# Patient Record
Sex: Female | Born: 1935 | Race: White | Hispanic: No | Smoking: Never smoker
Health system: Southern US, Community
[De-identification: ages and names within clinical notes are randomized; demographics above are authoritative.]

## PROBLEM LIST (undated history)

## (undated) DIAGNOSIS — E785 Hyperlipidemia, unspecified: Secondary | ICD-10-CM

## (undated) DIAGNOSIS — I5032 Chronic diastolic (congestive) heart failure: Secondary | ICD-10-CM

## (undated) DIAGNOSIS — I350 Nonrheumatic aortic (valve) stenosis: Secondary | ICD-10-CM

## (undated) DIAGNOSIS — K219 Gastro-esophageal reflux disease without esophagitis: Secondary | ICD-10-CM

## (undated) DIAGNOSIS — R011 Cardiac murmur, unspecified: Secondary | ICD-10-CM

## (undated) DIAGNOSIS — IMO0001 Reserved for inherently not codable concepts without codable children: Secondary | ICD-10-CM

## (undated) DIAGNOSIS — F419 Anxiety disorder, unspecified: Secondary | ICD-10-CM

## (undated) DIAGNOSIS — M1990 Unspecified osteoarthritis, unspecified site: Secondary | ICD-10-CM

## (undated) DIAGNOSIS — I10 Essential (primary) hypertension: Secondary | ICD-10-CM

## (undated) DIAGNOSIS — M47819 Spondylosis without myelopathy or radiculopathy, site unspecified: Secondary | ICD-10-CM

## (undated) DIAGNOSIS — M545 Low back pain, unspecified: Secondary | ICD-10-CM

## (undated) DIAGNOSIS — M4800 Spinal stenosis, site unspecified: Secondary | ICD-10-CM

## (undated) DIAGNOSIS — M47899 Other spondylosis, site unspecified: Secondary | ICD-10-CM

## (undated) DIAGNOSIS — Z952 Presence of prosthetic heart valve: Secondary | ICD-10-CM

## (undated) DIAGNOSIS — M4807 Spinal stenosis, lumbosacral region: Secondary | ICD-10-CM

## (undated) DIAGNOSIS — M47817 Spondylosis without myelopathy or radiculopathy, lumbosacral region: Secondary | ICD-10-CM

## (undated) HISTORY — PX: EYE SURGERY: SHX253

## (undated) HISTORY — PX: REPLACEMENT TOTAL KNEE: SUR1224

## (undated) HISTORY — DX: Low back pain: M54.5

## (undated) HISTORY — DX: Nonrheumatic aortic (valve) stenosis: I35.0

## (undated) HISTORY — DX: Unspecified osteoarthritis, unspecified site: M19.90

## (undated) HISTORY — PX: REFRACTIVE SURGERY: SHX103

## (undated) HISTORY — DX: Hyperlipidemia, unspecified: E78.5

## (undated) HISTORY — DX: Other spondylosis, site unspecified: M47.899

## (undated) HISTORY — PX: TONSILLECTOMY: SUR1361

## (undated) HISTORY — DX: Spinal stenosis, site unspecified: M48.00

## (undated) HISTORY — DX: Spondylosis without myelopathy or radiculopathy, site unspecified: M47.819

## (undated) HISTORY — DX: Spondylosis without myelopathy or radiculopathy, lumbosacral region: M47.817

## (undated) HISTORY — DX: Low back pain, unspecified: M54.50

## (undated) HISTORY — PX: CATARACT EXTRACTION: SUR2

## (undated) HISTORY — DX: Spinal stenosis, lumbosacral region: M48.07

## (undated) HISTORY — PX: KNEE ARTHROSCOPY: SUR90

## (undated) HISTORY — DX: Gastro-esophageal reflux disease without esophagitis: K21.9

## (undated) HISTORY — DX: Chronic diastolic (congestive) heart failure: I50.32

## (undated) HISTORY — DX: Essential (primary) hypertension: I10

---

## 2005-10-09 ENCOUNTER — Encounter: Admission: RE | Admit: 2005-10-09 | Discharge: 2005-10-09 | Payer: Self-pay | Admitting: Orthopedic Surgery

## 2005-10-25 ENCOUNTER — Encounter: Admission: RE | Admit: 2005-10-25 | Discharge: 2005-11-13 | Payer: Self-pay | Admitting: Orthopedic Surgery

## 2006-02-19 HISTORY — PX: CHOLECYSTECTOMY: SHX55

## 2007-03-25 ENCOUNTER — Ambulatory Visit: Payer: Self-pay | Admitting: Gastroenterology

## 2007-03-25 LAB — CONVERTED CEMR LAB
ALT: 21 units/L (ref 0–35)
AST: 16 units/L (ref 0–37)
Basophils Absolute: 0.1 10*3/uL (ref 0.0–0.1)
Basophils Relative: 0.8 % (ref 0.0–1.0)
CO2: 33 meq/L — ABNORMAL HIGH (ref 19–32)
Chloride: 100 meq/L (ref 96–112)
Eosinophils Absolute: 0.1 10*3/uL (ref 0.0–0.6)
GFR calc Af Amer: 78 mL/min
GFR calc non Af Amer: 64 mL/min
Glucose, Bld: 102 mg/dL — ABNORMAL HIGH (ref 70–99)
Lymphocytes Relative: 35.8 % (ref 12.0–46.0)
Monocytes Absolute: 0.9 10*3/uL — ABNORMAL HIGH (ref 0.2–0.7)
Neutro Abs: 3.7 10*3/uL (ref 1.4–7.7)
Neutrophils Relative %: 49.9 % (ref 43.0–77.0)
Platelets: 356 10*3/uL (ref 150–400)
Potassium: 3.2 meq/L — ABNORMAL LOW (ref 3.5–5.1)
Total Bilirubin: 1.1 mg/dL (ref 0.3–1.2)
Total Protein: 7.3 g/dL (ref 6.0–8.3)

## 2007-03-26 ENCOUNTER — Ambulatory Visit: Payer: Self-pay | Admitting: Gastroenterology

## 2007-04-02 ENCOUNTER — Ambulatory Visit: Payer: Self-pay | Admitting: Gastroenterology

## 2007-04-02 LAB — CONVERTED CEMR LAB
BUN: 8 mg/dL (ref 6–23)
CO2: 33 meq/L — ABNORMAL HIGH (ref 19–32)
Chloride: 102 meq/L (ref 96–112)
GFR calc Af Amer: 89 mL/min
GFR calc non Af Amer: 74 mL/min
Potassium: 3.5 meq/L (ref 3.5–5.1)
Sodium: 143 meq/L (ref 135–145)

## 2007-04-09 ENCOUNTER — Ambulatory Visit: Payer: Self-pay | Admitting: Gastroenterology

## 2007-06-25 ENCOUNTER — Emergency Department (HOSPITAL_COMMUNITY): Admission: EM | Admit: 2007-06-25 | Discharge: 2007-06-25 | Payer: Self-pay | Admitting: Emergency Medicine

## 2008-03-29 ENCOUNTER — Encounter: Admission: RE | Admit: 2008-03-29 | Discharge: 2008-04-15 | Payer: Self-pay | Admitting: Sports Medicine

## 2010-07-04 NOTE — Assessment & Plan Note (Signed)
[facility]                         GASTROENTEROLOGY OFFICE NOTE   NAME:MOONEYFraya, Blasko                       MRN:          [id number]  DATE:03/25/2007                            DOB:          1925-04-25    PRIMARY CARE PHYSICIAN:  Dr. Eligah East.  She was self referred.  Her  chief complaint is dysphagia.   HISTORY OF PRESENT ILLNESS:  [name] is a very pleasant 77 year old  woman who had an acute diarrhea vomiting illness 1 week ago.  She said  she awoke Wednesday morning with prolific watery diarrhea, and vomited  copious amounts of gastric contents.  Since then she has eaten only  sporadically.  Most foods, and even liquids, tend to hang in her mid  sternum.  She is keeping her pills down, however.  She has not had a  good meal in several days.  GERD (long time pyrosis, has been on PPI for  10 to 15 years with very good relief of symptoms).   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   PAST MEDICAL HISTORY:  1. Hypertension.  2. Elevated cholesterol.  3. Status post cholecystectomy.   CURRENT MEDICINES:  1. Carvedilol.  2. Felodipine.  3. Hydrochlorothiazide.  4. Simvastatin.  5. Vitamin D.  6. Fish oil.  7. Omeprazole.   ALLERGIES:  No known drug allergies.   SOCIAL HISTORY:  Widowed, 3 children, nonsmoker, nondrinker.   FAMILY HISTORY:  No colon cancer or colon polyps in family.  Breast  cancer runs in her family.   PHYSICAL EXAMINATION:  Height 5 feet 5 inches, 154 pounds.  Blood  pressure 138/78, pulse 66.  CONSTITUTIONAL:  Generally well appearing.  NEUROLOGIC:  Alert and oriented x3.  EYES:  Extraocular movements intact.  MOUTH:  Oropharynx moist, no lesions.  NECK:  Supple, no lymphadenopathy.  CARDIOVASCULAR:  Heart regular rate and rhythm.  LUNGS:  Clear to auscultation bilaterally.  ABDOMEN:   Soft, nontender, nondistended, normal bowel sounds.  EXTREMITIES:  No lower extremity edema.  SKIN:  No rashes, lesions on visible extremities.   ASSESSMENT AND PLAN:  A 77 year old woman with recent diarrhea and  vomiting illness, solid and liquid food dysphagia since then  (approximately 1 week).   She is keeping her pills down, so I suspect some of the liquids she  drinks is getting through.  She does not appear overtly dehydrated on  exam.  That being said, I will get her a basic set of labs today  including a CBC and a complete metabolic profile.  I will also arrange  for her to have an esophagogastroduodenoscopy performed tomorrow.  She  has to push fluids until then.  I am suspicious of peptic stricturing,  perhaps monilial esophagitis.  Given her weight gain in the past couple  of years, it seems unlikely that she has a neoplasm, but that is also on  the differential list.     Milus Banister, MD  Electronically Signed    DPJ/MedQ  DD: 03/25/2007  DT: 03/25/2007  Job #: [id number]   cc:   Eligah East, MD

## 2010-07-04 NOTE — Assessment & Plan Note (Signed)
[place]                         GASTROENTEROLOGY OFFICE NOTE   NAME:MOONEYAreyanna, Smith                       MRN:          [id number]  DATE:04/09/2007                            DOB:          07-02-1925    PRIMARY CARE PHYSICIAN:  Dr. Eligah East   GI PROBLEM LIST:  Acute vomiting/diarrheal illness, January 2009.  Likely infectious.  Esophagogastroduodenoscopy, February 2009, showed  mild nonspecific gastritis and CLO-testing was negative.  Responded to  empiric therapy very well.   INTERVAL HISTORY:  I last saw [name] at the time of her EGD about two  weeks ago.  Since then, she has stayed on the Reglan t.i.d. and the  Phenergan at night and her symptoms completely resolved.  The diarrhea  is gone.  She is eating normally now.   CURRENT MEDICINES:  1. Carvedilol.  2. Felodipine.  3. Hydrochlorothiazide.  4. Simvastatin.  5. Vitamin D.  6. Fish oil.  7. Omeprazole.  8. Reglan 5 mg t.i.d..  9. Phenergan 25 mg nightly.   PHYSICAL EXAM:  Weight 155 pounds, which is up 1 pound since her last  visit.  Blood pressure 138/74, pulse 76.  CONSTITUTIONAL:  Generally well-appearing.  ABDOMEN:  Soft, nontender, nondistended.  Normal bowel sounds.   ASSESSMENT AND PLAN:  Patient is a 77 year old woman with resolved  diarrheal and vomiting illness.   I suspect she had a viral gastroenteritis and she is doing very well  over the past one to two weeks.  She will stop taking the Reglan and the  Phenergan for now and knows that, if mild nausea returns, I favor her  taking the Phenergan at night.  She knows if anything dramatic recurs,  however, to get back in touch with me.  I see no reason for any further  testing at this point and she will simply get back in touch with me if  she has any further troubles.    Milus Banister, MD  Electronically Signed   DPJ/MedQ  DD: 04/09/2007  DT: 04/09/2007  Job #: [id number]   cc:   Eligah East, MD

## 2011-05-14 ENCOUNTER — Ambulatory Visit (INDEPENDENT_AMBULATORY_CARE_PROVIDER_SITE_OTHER): Payer: Medicare Other | Admitting: Nurse Practitioner

## 2011-05-14 ENCOUNTER — Telehealth: Payer: Self-pay | Admitting: Gastroenterology

## 2011-05-14 ENCOUNTER — Encounter: Payer: Self-pay | Admitting: Nurse Practitioner

## 2011-05-14 VITALS — BP 128/68 | HR 72 | Ht 64.0 in | Wt 153.4 lb

## 2011-05-14 DIAGNOSIS — K219 Gastro-esophageal reflux disease without esophagitis: Secondary | ICD-10-CM | POA: Insufficient documentation

## 2011-05-14 DIAGNOSIS — R1310 Dysphagia, unspecified: Secondary | ICD-10-CM | POA: Insufficient documentation

## 2011-05-14 HISTORY — DX: Gastro-esophageal reflux disease without esophagitis: K21.9

## 2011-05-14 NOTE — Patient Instructions (Signed)
Increase the Omeprazole 20 mg to twice daily.    We have scheduled the Endoscopy with Dr. Owens Loffler for Wednesday 05-16-2011. Directions and information provided.  Esophagogastroduodenoscopy  This is an endoscopic procedure (a procedure that uses a device like a flexible telescope) that allows your caregiver to view the upper stomach and small bowel. This test allows your caregiver to look at the esophagus. The esophagus carries food from your mouth to your stomach. They can also look at your duodenum. This is the first part of the small intestine that attaches to the stomach. This test is used to detect problems in the bowel such as ulcers and inflammation. PREPARATION FOR TEST Nothing to eat after midnight the day before the test. NORMAL FINDINGS Normal esophagus, stomach, and duodenum. Ranges for normal findings may vary among different laboratories and hospitals. You should always check with your doctor after having lab work or other tests done to discuss the meaning of your test results and whether your values are considered within normal limits. MEANING OF TEST  Your caregiver will go over the test results with you and discuss the importance and meaning of your results, as well as treatment options and the need for additional tests if necessary. OBTAINING THE TEST RESULTS It is your responsibility to obtain your test results. Ask the lab or department performing the test when and how you will get your results. Document Released: 06/08/2004 Document Revised: 01/25/2011 Document Reviewed: 01/16/2008 [facility] Patient Information 2012 [place].information provided.

## 2011-05-14 NOTE — Telephone Encounter (Signed)
Pt is having severe swallowing problems and is choking on everything she eats, also she has indigestion that is causing her a lot of pain.  Pt has been added to [employer] schedule for today at 130pm

## 2011-05-14 NOTE — Progress Notes (Signed)
05/14/2011 LUCYANA NIMER [id number] [birth year]   HISTORY OF PRESENT ILLNESS: Patient is an 78 year old female who was seen by Dr. Ardis Hughs in 2009 for dysphagia and nausea. She subsequently had an EGD revealing only gastritis.   Patient comes in today for evaluation of dysphagia. She cannot be specific about its onset as it seems to be somewhat of an ongoing problem with has acutely gotten worse over the last 4 day.  To avoid impaction she forces herself to regurgitate esophageal contents.  No odynophagia. She hasn't had any recent antibiotics to suggest candida esophagitis. She has taken Omeprazole for years but often takes it after her breakfast. Also taking Tums for breakthrough heartburn.    Past Medical History  Diagnosis Date  . Hypertension   . Hyperlipidemia    Past Surgical History  Procedure Date  . Cholecystectomy 2008  . Tonsillectomy     70 years ago    reports that she has never smoked. She does not have any smokeless tobacco history on file. She reports that she does not drink alcohol or use illicit drugs. family history includes Breast cancer in her sister and Muscular dystrophy in her father.  There is no history of Colon cancer. Not on File    Outpatient Encounter Prescriptions as of 05/14/2011  Medication Sig Dispense Refill  . amLODipine (NORVASC) 10 MG tablet Take 10 mg by mouth daily.      Marland Kitchen atorvastatin (LIPITOR) 40 MG tablet Take 40 mg by mouth daily. Take 1 tab at bedtime      . carvedilol (COREG) 12.5 MG tablet Take 12.5 mg by mouth. Take 1 AM and PM      . cholecalciferol (VITAMIN D) 1000 UNITS tablet Take 1,000 Units by mouth daily.      . fenofibrate 160 MG tablet Take 160 mg by mouth daily. Take 1 tab daily      . fish oil-omega-3 fatty acids 1000 MG capsule Take 2 g by mouth daily. Take 1 tab      . hydrochlorothiazide (HYDRODIURIL) 25 MG tablet Take 25 mg by mouth daily.      Marland Kitchen omeprazole (PRILOSEC) 20 MG capsule Take 20 mg by mouth daily. Take 1 tab          REVIEW OF SYSTEMS  : Positive for hearing problems, sleeping problems, and excessive urination. All other systems reviewed and negative except where noted in the History of Present Illness.   PHYSICAL EXAM: BP 128/68  Pulse 72  Ht 5\' 4"  (1.626 m)  Wt 153 lb 6.4 oz (69.582 kg)  BMI 26.33 kg/m2 General: Well developed white female in no acute distress Head: Normocephalic and atraumatic Eyes:  sclerae anicteric,conjunctive pink. Ears: decreasedl auditory acuity Mouth: No deformity or lesions Neck: Supple, no masses.  Lungs: Clear throughout to auscultation Heart: Regular rate and rhythm; no murmurs heard Abdomen: Soft, non distended, nontender. No masses or hepatomegaly noted. Normal Bowel sounds Rectal: Not done Musculoskeletal: Symmetrical with no gross deformities  Skin: No lesions on visible extremities Extremities: No edema or deformities noted Neurological: Alert oriented, grossly nonfocal Cervical Nodes:  No significant cervical adenopathy Psychological:  Alert and cooperative. Normal mood and affect  ASSESSMENT AND PLAN; 1. acute on chronic solid food dysphagia, worse over last four days. EGD in 2009 was unrevealing for any strictures/rings. Patient is quite miserable, she can't get anything solid down. Will arrange for upper endoscopy with probable dilation.The benefits, risks, and potential complications of EGD with possible biopsies and/or  dilation were discussed with the patient and she agrees to proceed. In the meantime patient will eat small bites, chew well and take plenty of fluid with meals to avoid food impaction. 2. GERD, breakthrough pyrosis on daily omeprazole. Will increase omeprazole to twice daily, I stressed the importance of taking medication before meals. Reviewed some basic antireflux measures with her.

## 2011-05-15 NOTE — Progress Notes (Signed)
Agree with initial assessment and plans 

## 2011-05-16 ENCOUNTER — Ambulatory Visit (AMBULATORY_SURGERY_CENTER): Payer: Medicare Other | Admitting: Gastroenterology

## 2011-05-16 ENCOUNTER — Encounter: Payer: Self-pay | Admitting: Gastroenterology

## 2011-05-16 VITALS — BP 115/77 | HR 69 | Temp 98.2°F | Resp 18 | Ht 64.0 in | Wt 153.0 lb

## 2011-05-16 DIAGNOSIS — K219 Gastro-esophageal reflux disease without esophagitis: Secondary | ICD-10-CM

## 2011-05-16 DIAGNOSIS — K2990 Gastroduodenitis, unspecified, without bleeding: Secondary | ICD-10-CM

## 2011-05-16 DIAGNOSIS — D131 Benign neoplasm of stomach: Secondary | ICD-10-CM

## 2011-05-16 DIAGNOSIS — R1310 Dysphagia, unspecified: Secondary | ICD-10-CM

## 2011-05-16 DIAGNOSIS — K2970 Gastritis, unspecified, without bleeding: Secondary | ICD-10-CM

## 2011-05-16 MED ORDER — SODIUM CHLORIDE 0.9 % IV SOLN: 500.0000 mL | INTRAVENOUS | Status: DC

## 2011-05-16 NOTE — Patient Instructions (Signed)
DISCHARGE INSTRUCTIONS GIVEN WITH VERBAL UNDERSTANDING. HANDOUTS ON GASTRITIS,ESOPHAGISTIS,AND A HIATAL HERNIA GIVEN. RESUME PREVIOUS MEDICATIONS.YOU HAD AN ENDOSCOPIC PROCEDURE TODAY AT [facility]: Refer to the procedure report that was given to you for any specific questions about what was found during the examination.  If the procedure report does not answer your questions, please call your gastroenterologist to clarify.  If you requested that your care partner not be given the details of your procedure findings, then the procedure report has been included in a sealed envelope for you to review at your convenience later.  YOU SHOULD EXPECT: Some feelings of bloating in the abdomen. Passage of more gas than usual.  Walking can help get rid of the air that was put into your GI tract during the procedure and reduce the bloating. If you had a lower endoscopy (such as a colonoscopy or flexible sigmoidoscopy) you may notice spotting of blood in your stool or on the toilet paper. If you underwent a bowel prep for your procedure, then you may not have a normal bowel movement for a few days.  DIET: Your first meal following the procedure should be a light meal and then it is ok to progress to your normal diet.  A half-sandwich or bowl of soup is an example of a good first meal.  Heavy or fried foods are harder to digest and may make you feel nauseous or bloated.  Likewise meals heavy in dairy and vegetables can cause extra gas to form and this can also increase the bloating.  Drink plenty of fluids but you should avoid alcoholic beverages for 24 hours.  ACTIVITY: Your care partner should take you home directly after the procedure.  You should plan to take it easy, moving slowly for the rest of the day.  You can resume normal activity the day after the procedure however you should NOT DRIVE or use heavy machinery for 24 hours (because of the sedation medicines used during the test).    SYMPTOMS  TO REPORT IMMEDIATELY: A gastroenterologist can be reached at any hour.  During normal business hours, 8:30 AM to 5:00 PM Monday through Friday, call [phone].  After hours and on weekends, please call the GI answering service at [phone] who will take a message and have the physician on call contact you.   Following upper endoscopy (EGD)  Vomiting of blood or coffee ground material  New chest pain or pain under the shoulder blades  Painful or persistently difficult swallowing  New shortness of breath  Fever of 100F or higher  Black, tarry-looking stools  FOLLOW UP: If any biopsies were taken you will be contacted by phone or by letter within the next 1-3 weeks.  Call your gastroenterologist if you have not heard about the biopsies in 3 weeks.  Our staff will call the home number listed on your records the next business day following your procedure to check on you and address any questions or concerns that you may have at that time regarding the information given to you following your procedure. This is a courtesy call and so if there is no answer at the home number and we have not heard from you through the emergency physician on call, we will assume that you have returned to your regular daily activities without incident.  SIGNATURES/CONFIDENTIALITY: You and/or your care partner have signed paperwork which will be entered into your electronic medical record.  These signatures attest to the fact that that the information  above on your After Visit Summary has been reviewed and is understood.  Full responsibility of the confidentiality of this discharge information lies with you and/or your care-partner.

## 2011-05-16 NOTE — Progress Notes (Signed)
Patient did not experience any of the following events: a burn prior to discharge; a fall within the facility; wrong site/side/patient/procedure/implant event; or a hospital transfer or hospital admission upon discharge from the facility. (G8907) Patient did not have preoperative order for IV antibiotic SSI prophylaxis. (G8918)  

## 2011-05-16 NOTE — Op Note (Signed)
[facility]. [place], [place]  [id number]  ENDOSCOPY PROCEDURE REPORT  PATIENT:  [name], [name]  MR#:  [id number] BIRTHDATE:  17-Jul-1925, 86 yrs. old  GENDER:  female ENDOSCOPIST:  Milus Banister, MD PROCEDURE DATE:  05/16/2011 PROCEDURE:  EGD with biopsy, 43239 ASA CLASS:  Class II INDICATIONS:  recent worsening of dysphagia MEDICATIONS:   Fentanyl 25 mcg IV, These medications were titrated to patient response per physician's verbal order, Versed 4 mg IV TOPICAL ANESTHETIC:  Cetacaine Spray  DESCRIPTION OF PROCEDURE:   After the risks benefits and alternatives of the procedure were thoroughly explained, informed consent was obtained.  The LB GIF-H180 [id number] endoscope was introduced through the mouth and advanced to the second portion of the duodenum, without limitations.  The instrument was slowly withdrawn as the mucosa was fully examined. <<PROCEDUREIMAGES>> There was mild, non-specific distal gastritis. Biopsies were taken and sent to pathology (jar 1) (see image2).  A hiatal hernia was found. This was 2cm (see image4).  There was minor reflux esophagitis but the GE junction has no strictures or stenosis. Otherwise the examination was normal (see image1 and image3). Retroflexed views revealed no abnormalities.    The scope was then withdrawn from the patient and the procedure completed. COMPLICATIONS:  None  ENDOSCOPIC IMPRESSION: 1) Mild gastritis, biopsied to check for H. pylori 2) Small hiatal hernia 3) Mild esophagitis, reflux related but no stenosis or stricturing  3) Otherwise normal examination  RECOMMENDATIONS: If biopsies show H. pylori, you will be started on the correct abx. Chew your food well, eat slowly and take small bites. Continue your omeprazole twice daily (best taken 20-30 min prior to breakfast and dinner meals). Dr. Ardis Hughs' office will call to set up return visit in 4-5 weeks to check on your  symptoms.  ______________________________ Milus Banister, MD  n. eSIGNED:   Milus Banister at 05/16/2011 10:05 AM  Quillian Quince, [id number]

## 2011-05-17 ENCOUNTER — Telehealth: Payer: Self-pay | Admitting: *Deleted

## 2011-05-17 NOTE — Telephone Encounter (Signed)
  Follow up Call-  Call back number 05/16/2011  Post procedure Call Back phone  # [phone]  Permission to leave phone message Yes     Patient questions:  Do you have a fever, pain , or abdominal swelling? no Pain Score  0 *  Have you tolerated food without any problems? yes  Have you been able to return to your normal activities? yes  Do you have any questions about your discharge instructions: Diet   no Medications  no Follow up visit  no  Do you have questions or concerns about your Care? no  Actions: * If pain score is 4 or above: No action needed, pain <4.

## 2011-05-28 ENCOUNTER — Encounter: Payer: Self-pay | Admitting: Gastroenterology

## 2011-06-22 ENCOUNTER — Ambulatory Visit: Payer: Medicare Other | Admitting: Gastroenterology

## 2013-11-18 ENCOUNTER — Encounter: Payer: Self-pay | Admitting: Physician Assistant

## 2013-11-18 ENCOUNTER — Ambulatory Visit (INDEPENDENT_AMBULATORY_CARE_PROVIDER_SITE_OTHER): Payer: Medicare Other | Admitting: Physician Assistant

## 2013-11-18 ENCOUNTER — Encounter: Payer: Self-pay | Admitting: *Deleted

## 2013-11-18 VITALS — BP 160/70 | HR 70 | Ht 64.0 in | Wt 145.5 lb

## 2013-11-18 DIAGNOSIS — Z01818 Encounter for other preprocedural examination: Secondary | ICD-10-CM

## 2013-11-18 DIAGNOSIS — I10 Essential (primary) hypertension: Secondary | ICD-10-CM

## 2013-11-18 DIAGNOSIS — I359 Nonrheumatic aortic valve disorder, unspecified: Secondary | ICD-10-CM

## 2013-11-18 DIAGNOSIS — E785 Hyperlipidemia, unspecified: Secondary | ICD-10-CM

## 2013-11-18 DIAGNOSIS — R011 Cardiac murmur, unspecified: Secondary | ICD-10-CM

## 2013-11-18 DIAGNOSIS — I358 Other nonrheumatic aortic valve disorders: Secondary | ICD-10-CM | POA: Insufficient documentation

## 2013-11-18 NOTE — Progress Notes (Signed)
Date:  11/18/2013   ID:  [name], DOB Jan 09, 1926, MRN [id number]  PCP:  Sheri Dimmer, MD  Primary Cardiologist:  [name]     History of Present Illness: [name] is a 79 y.o. female the history of murmur, hypertension and hyperlipidemia. Check cholecystectomy in 2008. She is here for preop clearance for right knee surgery which is scheduled in about 3 weeks with Dr. Percell Miller.  She has been seen by Dr.Berry remotely for chest pain. She apparently had EKG, stress test, echocardiogram which according to the patient, was all normal.  He did not have the chart currently in the office.  She reports exercising 3 times a week at an exercise class where they do a lot of stretching and lifting dumbbells has had no symptoms of angina.  Patient reports her fenofibrate increased recently.  No family history of coronary artery disease.   She denies nausea, vomiting, fever, chest pain, shortness of breath, orthopnea, dizziness, PND, cough, congestion, abdominal pain, hematochezia, melena, lower extremity edema, claudication.  Wt Readings from Last 3 Encounters:  11/18/13 145 lb 8 oz (65.998 kg)  05/16/11 153 lb (69.4 kg)  05/14/11 153 lb 6.4 oz (69.582 kg)     Past Medical History  Diagnosis Date  . Hypertension   . Hyperlipidemia     Current Outpatient Prescriptions  Medication Sig Dispense Refill  . amLODipine (NORVASC) 10 MG tablet Take 10 mg by mouth daily.      . carvedilol (COREG) 12.5 MG tablet Take 12.5 mg by mouth. Take 1 AM and PM      . cholecalciferol (VITAMIN D) 1000 UNITS tablet Take 1,000 Units by mouth daily.      . fenofibrate 160 MG tablet Take 200 mg by mouth daily. Take 1 tab daily      . fish oil-omega-3 fatty acids 1000 MG capsule Take 2 g by mouth daily. Take 1 tab      . fluticasone (VERAMYST) 27.5 MCG/SPRAY nasal spray Place 1 spray into the nose 2 (two) times daily.      . hydrochlorothiazide (HYDRODIURIL) 25 MG tablet Take 25 mg by mouth daily.      Marland Kitchen  omeprazole (PRILOSEC) 40 MG capsule Take 40 mg by mouth daily.       No current facility-administered medications for this visit.    Allergies:   No Known Allergies  Social History:  The patient  reports that she has never smoked. She does not have any smokeless tobacco history on file. She reports that she does not drink alcohol or use illicit drugs.   Family history:   Family History  Problem Relation Age of Onset  . Breast cancer Sister     2 sisters  . Colon cancer Neg Hx   . Muscular dystrophy Father     Died at 43    ROS:  Please see the history of present illness.  All other systems reviewed and negative.   PHYSICAL EXAM: VS:  BP 160/70  Pulse 70  Ht 5\' 4"  (1.626 m)  Wt 145 lb 8 oz (65.998 kg)  BMI 24.96 kg/m2 Well nourished, well developed, in no acute distress HEENT: Pupils are equal round react to light accommodation extraocular movements are intact.  Neck: no JVDNo cervical lymphadenopathy. Cardiac: Regular rate and rhythm with 3/6 murmur which radiates to the carotids were is loudest. no rubs or gallops. Lungs:  clear to auscultation bilaterally, no wheezing, rhonchi or rales Abd: soft, nontender, positive  bowel sounds all quadrants, no hepatosplenomegaly Ext: no lower extremity edema.  2+ radial and dorsalis pedis pulses. Skin: warm and dry Neuro:  Grossly normal  EKG:  Normal sinus rhythm rate 69 beats per minute Q waves in leads 123 aVF  ASSESSMENT AND PLAN:  Problem List Items Addressed This Visit   Aortic heart murmur     Who will evaluate the aortic murmur with a 2-D echocardiogram.   We'll also make determination on preoperative clearance based on echo.  We'll try to retrieve previous echocardiogram results from chart in storage.    Essential hypertension     Blood pressure is currently elevated however the patient states at home it was 131/57. It runs there were in the 120s. It appears that she seen in the clinic her blood pressure tends to go up.  No  changes today    Hyperlipidemia     Followed by PCP. Takes fenofibrate and fish oil     Preoperative clearance     Which reports no symptoms of angina with regular exercise.  We'll complete 2-D echocardiogram to assess aortic valve.     Other Visit Diagnoses   Undiagnosed cardiac murmurs    -  Primary    Relevant Orders       2D Echocardiogram without contrast    Pre-op examination        Relevant Orders       2D Echocardiogram without contrast

## 2013-11-18 NOTE — Patient Instructions (Signed)
Your physician recommends that you schedule a follow-up appointment in: AS NEEDED PENDING TEST RESULTS  Your physician has requested that you have an echocardiogram. Echocardiography is a painless test that uses sound waves to create images of your heart. It provides your doctor with information about the size and shape of your heart and how well your heart's chambers and valves are working. This procedure takes approximately one hour. There are no restrictions for this procedure.

## 2013-11-18 NOTE — Assessment & Plan Note (Signed)
Blood pressure is currently elevated however the patient states at home it was 131/57. It runs there were in the 120s. It appears that she seen in the clinic her blood pressure tends to go up.  No changes today

## 2013-11-18 NOTE — Assessment & Plan Note (Signed)
Followed by PCP. Takes fenofibrate and fish oil

## 2013-11-18 NOTE — Assessment & Plan Note (Signed)
Which reports no symptoms of angina with regular exercise.  We'll complete 2-D echocardiogram to assess aortic valve.

## 2013-11-18 NOTE — Assessment & Plan Note (Addendum)
Who will evaluate the aortic murmur with a 2-D echocardiogram.   We'll also make determination on preoperative clearance based on echo.  We'll try to retrieve previous echocardiogram results from chart in storage.

## 2013-11-23 ENCOUNTER — Ambulatory Visit (HOSPITAL_COMMUNITY)
Admission: RE | Admit: 2013-11-23 | Discharge: 2013-11-23 | Disposition: A | Discharge status: Home / Self Care | Payer: Medicare Other | Source: Ambulatory Visit | Attending: Cardiology | Admitting: Cardiology

## 2013-11-23 ENCOUNTER — Other Ambulatory Visit: Payer: Self-pay | Admitting: Physician Assistant

## 2013-11-23 DIAGNOSIS — I10 Essential (primary) hypertension: Secondary | ICD-10-CM | POA: Diagnosis not present

## 2013-11-23 DIAGNOSIS — R011 Cardiac murmur, unspecified: Secondary | ICD-10-CM | POA: Insufficient documentation

## 2013-11-23 DIAGNOSIS — E785 Hyperlipidemia, unspecified: Secondary | ICD-10-CM | POA: Diagnosis not present

## 2013-11-23 DIAGNOSIS — Z01818 Encounter for other preprocedural examination: Secondary | ICD-10-CM

## 2013-11-23 DIAGNOSIS — R010 Benign and innocent cardiac murmurs: Secondary | ICD-10-CM

## 2013-11-23 NOTE — H&P (Signed)
TOTAL KNEE ADMISSION H&P  Patient is being admitted for right total knee arthroplasty.  Subjective:  Chief Complaint:right knee pain.  HPI: [name], 80 y.o. female, has a history of pain and functional disability in the right knee due to arthritis and has failed non-surgical conservative treatments for greater than 12 weeks to includeNSAID's and/or analgesics, corticosteriod injections and activity modification.  Onset of symptoms was gradual, starting >10 years ago with rapidlly worsening course since that time. The patient noted prior procedures on the knee to include  arthroscopy and menisectomy on the right knee(s).  Patient currently rates pain in the right knee(s) at 8 out of 10 with activity. Patient has night pain, worsening of pain with activity and weight bearing and pain with passive range of motion.  Patient has evidence of subchondral cysts, subchondral sclerosis, periarticular osteophytes and joint space narrowing by imaging studies. There is no active infection.  Patient Active Problem List   Diagnosis Date Noted  . Essential hypertension 11/18/2013  . Hyperlipidemia 11/18/2013  . Aortic heart murmur 11/18/2013  . Preoperative clearance 11/18/2013  . GERD (gastroesophageal reflux disease) 05/14/2011  . Dysphagia 05/14/2011   Past Medical History  Diagnosis Date  . Hypertension   . Hyperlipidemia     Past Surgical History  Procedure Laterality Date  . Cholecystectomy  2008  . Tonsillectomy      70 years ago     (Not in a hospital admission) No Known Allergies  History  Substance Use Topics  . Smoking status: Never Smoker   . Smokeless tobacco: Not on file  . Alcohol Use: No    Family History  Problem Relation Age of Onset  . Breast cancer Sister     2 sisters  . Colon cancer Neg Hx   . Muscular dystrophy Father     Died at 57     Review of Systems  Constitutional: Negative.   HENT: Negative.   Eyes: Negative.   Respiratory: Negative.    Cardiovascular: Negative.   Gastrointestinal: Negative.   Genitourinary: Negative.   Musculoskeletal: Positive for joint pain.  Skin: Negative.   Neurological: Negative.   Endo/Heme/Allergies: Negative.   Psychiatric/Behavioral: Negative.     Objective:  Physical Exam  Constitutional: She is oriented to person, place, and time. She appears well-developed and well-nourished.  HENT:  Head: Normocephalic and atraumatic.  Eyes: EOM are normal. Pupils are equal, round, and reactive to light.  Neck: Normal range of motion. Neck supple.  Cardiovascular: Normal rate and regular rhythm.  Exam reveals no gallop and no friction rub.   Murmur heard. Respiratory: Effort normal and breath sounds normal. No respiratory distress. She has no wheezes. She has no rales.  GI: Soft. Bowel sounds are normal. She exhibits no distension.  Musculoskeletal:  Antalgic gait varus thrust right greater than left. Left knee has slight varus motion 0-110 degrees minimal swelling. Sore medially tibiofemoral and patellofemoral crepitus. On the right she has a 1+ effusion, range of motion 5-90. More than 5 degrees of varus. She is neurovascularly intact distally.  Neurological: She is alert and oriented to person, place, and time.  Skin: Skin is warm and dry.  Psychiatric: She has a normal mood and affect. Her behavior is normal. Judgment and thought content normal.    Vital signs in last 24 hours: @VSRANGES @  Labs:   Estimated body mass index is 24.96 kg/(m^2) as calculated from the following:   Height as of 11/18/13: 5\' 4"  (1.626 m).  Weight as of 11/18/13: 65.998 kg (145 lb 8 oz).   Imaging Review Plain radiographs demonstrate severe degenerative joint disease of the right knee(s). The overall alignment ismild varus. The bone quality appears to be fair for age and reported activity level.  Assessment/Plan:  End stage arthritis, right knee   The patient history, physical examination, clinical judgment  of the provider and imaging studies are consistent with end stage degenerative joint disease of the right knee(s) and total knee arthroplasty is deemed medically necessary. The treatment options including medical management, injection therapy arthroscopy and arthroplasty were discussed at length. The risks and benefits of total knee arthroplasty were presented and reviewed. The risks due to aseptic loosening, infection, stiffness, patella tracking problems, thromboembolic complications and other imponderables were discussed. The patient acknowledged the explanation, agreed to proceed with the plan and consent was signed. Patient is being admitted for inpatient treatment for surgery, pain control, PT, OT, prophylactic antibiotics, VTE prophylaxis, progressive ambulation and ADL's and discharge planning. The patient is planning to be discharged to skilled nursing facility

## 2013-11-23 NOTE — Progress Notes (Signed)
2D Echo Performed 11/23/2013    Marygrace Drought, RCS

## 2013-11-25 ENCOUNTER — Telehealth: Payer: Self-pay | Admitting: Physician Assistant

## 2013-11-25 NOTE — Telephone Encounter (Signed)
I called to discuss echo results with the patient.  She is not cleared for surgery at this time.  Appt with Dr. Gwenlyn Found will be scheduled.   Detroit Frieden , PAC

## 2013-11-26 ENCOUNTER — Telehealth: Payer: Self-pay | Admitting: *Deleted

## 2013-11-26 NOTE — Telephone Encounter (Signed)
Message copied by Chauncy Lean on Thu Nov 26, 2013 10:38 AM ------      Message from: Brett Canales      Created: Wed Nov 25, 2013  5:03 PM       Curt Bears,            This is JB's patient who's echo came back as Critical AS.  She was a preop clearance for knee surgery.  Please schedule her to see him.             Thanks,      Gaspar Bidding ------

## 2013-11-27 ENCOUNTER — Encounter (HOSPITAL_COMMUNITY): Payer: Self-pay | Admitting: Pharmacy Technician

## 2013-11-27 NOTE — Telephone Encounter (Signed)
I spoke with patient about echo results and the need for an appt for surgical clearance.  Dr Gwenlyn Found is out of the office until 10/20 and her surgery is scheduled for 10/21.  I made her an appt with Dr C on Monday 10/12.  Patient was agreeable and appreciative.

## 2013-11-27 NOTE — Telephone Encounter (Signed)
Pt was returning Kathryn's call in regards to her echo . Please call  Thanks

## 2013-11-30 ENCOUNTER — Encounter: Payer: Self-pay | Admitting: Cardiovascular Disease

## 2013-11-30 ENCOUNTER — Ambulatory Visit (INDEPENDENT_AMBULATORY_CARE_PROVIDER_SITE_OTHER): Payer: Medicare Other | Admitting: Cardiovascular Disease

## 2013-11-30 VITALS — BP 152/78 | HR 83 | Resp 16 | Ht 64.0 in | Wt 144.4 lb

## 2013-11-30 DIAGNOSIS — R5381 Other malaise: Secondary | ICD-10-CM

## 2013-11-30 DIAGNOSIS — I350 Nonrheumatic aortic (valve) stenosis: Secondary | ICD-10-CM

## 2013-11-30 DIAGNOSIS — Z79899 Other long term (current) drug therapy: Secondary | ICD-10-CM

## 2013-11-30 DIAGNOSIS — D689 Coagulation defect, unspecified: Secondary | ICD-10-CM

## 2013-11-30 LAB — CBC
HCT: 36.9 % (ref 36.0–46.0)
Hemoglobin: 12.4 g/dL (ref 12.0–15.0)
MCH: 29 pg (ref 26.0–34.0)
MCHC: 33.6 g/dL (ref 30.0–36.0)
MCV: 86.2 fL (ref 78.0–100.0)
PLATELETS: 474 10*3/uL — AB (ref 150–400)
RBC: 4.28 MIL/uL (ref 3.87–5.11)
RDW: 13.8 % (ref 11.5–15.5)
WBC: 6.5 10*3/uL (ref 4.0–10.5)

## 2013-11-30 LAB — BASIC METABOLIC PANEL
BUN: 20 mg/dL (ref 6–23)
CO2: 28 meq/L (ref 19–32)
CREATININE: 1.06 mg/dL (ref 0.50–1.10)
Calcium: 10.3 mg/dL (ref 8.4–10.5)
Chloride: 98 mEq/L (ref 96–112)
Glucose, Bld: 118 mg/dL — ABNORMAL HIGH (ref 70–99)
Potassium: 4.3 mEq/L (ref 3.5–5.3)
Sodium: 137 mEq/L (ref 135–145)

## 2013-11-30 NOTE — Patient Instructions (Signed)
Your physician has requested that you have a Right and Left Heart cardiac catheterization. Cardiac catheterization is used to diagnose and/or treat various heart conditions. Doctors may recommend this procedure for a number of different reasons. The most common reason is to evaluate chest pain. Chest pain can be a symptom of coronary artery disease (CAD), and cardiac catheterization can show whether plaque is narrowing or blocking your heart's arteries. This procedure is also used to evaluate the valves, as well as measure the blood flow and oxygen levels in different parts of your heart. For further information please visit [email or web address]. Please follow instruction sheet, as given.   Your physician recommends that you return for lab work in: 5-7 days prior to the cardiac cath.

## 2013-12-01 ENCOUNTER — Inpatient Hospital Stay (HOSPITAL_COMMUNITY): Admission: RE | Admit: 2013-12-01 | Payer: Medicare Other | Source: Ambulatory Visit

## 2013-12-01 ENCOUNTER — Other Ambulatory Visit: Payer: Self-pay | Admitting: *Deleted

## 2013-12-01 DIAGNOSIS — I350 Nonrheumatic aortic (valve) stenosis: Secondary | ICD-10-CM

## 2013-12-01 LAB — PROTIME-INR
INR: 1.01 (ref <1.50)
Prothrombin Time: 13.3 seconds (ref 11.6–15.2)

## 2013-12-01 LAB — APTT: APTT: 27 s (ref 24–37)

## 2013-12-06 DIAGNOSIS — I350 Nonrheumatic aortic (valve) stenosis: Secondary | ICD-10-CM

## 2013-12-06 HISTORY — DX: Nonrheumatic aortic (valve) stenosis: I35.0

## 2013-12-06 NOTE — Progress Notes (Signed)
Patient ID: [name], female   DOB: [birth year], 80 y.o.   MRN: [id number]     Reason for office visit Aortic stenosis  [name] is 80 years old and is referred to discuss the findings of an echocardiogram that showed severe aortic stenosis (mean gradient 50 mm Hg). She denies problems with exertional dyspnea, exertional chest pain or syncope but seems to be minimizing her symptoms. Digging a little deeper into her complaints, with her family present, it seems that she has some shortness of breath. At most she has NYHA functional class II dyspnea. She lives independently, takes care of all her household chores, drive this and is very active. Her right knee pain slows her down.  Her echo showed normal left ventricular systolic function, severe left ventricular hypertrophy and mildly dilated left atrium. There was mild diastolic dysfunction.  The echocardiogram is performed as part of preoperative evaluation before undergoing total right knee replacement ([name]).   Allergies  Allergen Reactions  . Aspirin Nausea And Vomiting  . Codeine Nausea And Vomiting    Current Outpatient Prescriptions  Medication Sig Dispense Refill  . amLODipine (NORVASC) 10 MG tablet Take 10 mg by mouth daily.      . carvedilol (COREG) 12.5 MG tablet Take 12.5 mg by mouth 2 (two) times daily.       . cholecalciferol (VITAMIN D) 1000 UNITS tablet Take 1,000 Units by mouth daily.      . fenofibrate micronized (LOFIBRA) 200 MG capsule Take 200 mg by mouth daily before breakfast.      . fish oil-omega-3 fatty acids 1000 MG capsule Take 1 g by mouth daily. With 300 mg of Omega      . fluticasone (VERAMYST) 27.5 MCG/SPRAY nasal spray Place 1 spray into both nostrils 2 (two) times daily as needed for allergies.       . hydrochlorothiazide (HYDRODIURIL) 25 MG tablet Take 25 mg by mouth daily.      Marland Kitchen omeprazole (PRILOSEC) 40 MG capsule Take 40 mg by mouth daily.       No current facility-administered  medications for this visit.    Past Medical History  Diagnosis Date  . Hypertension   . Hyperlipidemia     Past Surgical History  Procedure Laterality Date  . Cholecystectomy  2008  . Tonsillectomy      70 years ago    Family History  Problem Relation Age of Onset  . Breast cancer Sister     2 sisters  . Colon cancer Neg Hx   . Muscular dystrophy Father     Died at 47    History   Social History  . Marital Status: Widowed    Spouse Name: N/A    Number of Children: 3  . Years of Education: N/A   Occupational History  . Not on file.   Social History Main Topics  . Smoking status: Never Smoker   . Smokeless tobacco: Not on file  . Alcohol Use: No  . Drug Use: No  . Sexual Activity: Not on file   Other Topics Concern  . Not on file   Social History Narrative  . No narrative on file    Review of systems: The patient specifically denies any chest pain at rest or with exertion, dyspnea at rest or with exertion, orthopnea, paroxysmal nocturnal dyspnea, syncope, palpitations, focal neurological deficits, intermittent claudication, lower extremity edema, unexplained weight gain, cough, hemoptysis or wheezing.  The patient also denies abdominal pain, nausea,  vomiting, dysphagia, diarrhea, constipation, polyuria, polydipsia, dysuria, hematuria, frequency, urgency, abnormal bleeding or bruising, fever, chills, unexpected weight changes, mood swings, change in skin or hair texture, change in voice quality, auditory or visual problems, allergic reactions or rashes, new musculoskeletal complaints other than usual "aches and pains".   PHYSICAL EXAM BP 152/78  Pulse 83  Resp 16  Ht 5\' 4"  (1.626 m)  Wt 65.499 kg (144 lb 6.4 oz)  BMI 24.77 kg/m2  General: Alert, oriented x3, no distress Head: no evidence of trauma, PERRL, EOMI, no exophtalmos or lid lag, no myxedema, no xanthelasma; normal ears, nose and oropharynx Neck: normal jugular venous pulsations and no  hepatojugular reflux; brisk carotid pulses without delay I would allow carotid bruits that appear to radiate from the chest Chest: clear to auscultation, no signs of consolidation by percussion or palpation, normal fremitus, symmetrical and full respiratory excursions Cardiovascular: normal position and quality of the apical impulse, regular rhythm, normal first and second heart sounds, no rubs or gallops. Is a grade 3/6 mid peaking systolic murmur in the aortic focus radiating to the neck Abdomen: no tenderness or distention, no masses by palpation, no abnormal pulsatility or arterial bruits, normal bowel sounds, no hepatosplenomegaly Extremities: no clubbing, cyanosis or edema; 2+ radial, ulnar and brachial pulses bilaterally; 2+ right femoral, posterior tibial and dorsalis pedis pulses; 2+ left femoral, posterior tibial and dorsalis pedis pulses; no subclavian or femoral bruits Neurological: grossly nonfocal   EKG: Normal sinus rhythm with small Q waves in the inferior leads BMET    Component Value Date/Time   NA 137 11/30/2013 1608   K 4.3 11/30/2013 1608   CL 98 11/30/2013 1608   CO2 28 11/30/2013 1608   GLUCOSE 118* 11/30/2013 1608   BUN 20 11/30/2013 1608   CREATININE 1.06 11/30/2013 1608   CREATININE 0.8 04/02/2007 1024   CALCIUM 10.3 11/30/2013 1608   GFRNONAA 74 04/02/2007 1024   GFRAA 89 04/02/2007 1024     ASSESSMENT AND PLAN  [name] has good functional status but appears to have severe/critical aortic stenosis with early symptoms of valvular heart disease. Notwithstanding her advanced age, she is probably an excellent candidate for aortic valve replacement. Right and left heart catheterization is recommended both to corroborate the diagnosis and to see whether or not there is concomitant coronary disease. Unfortunately, her knee surgery will need to be placed on hold until the aortic valve issue is clarified and treated.  The cardiac cath procedure has been fully reviewed  with the patient and informed consent has been obtained.   Orders Placed This Encounter  Procedures  . APTT  . Basic metabolic panel  . Protime-INR  . CBC  . LEFT AND RIGHT HEART CATHETERIZATION WITH CORONARY ANGIOGRAM   No orders of the defined types were placed in this encounter.    Holli Humbles, MD, [place] [phone] office [phone] pager

## 2013-12-07 MED ORDER — CEFAZOLIN SODIUM-DEXTROSE 2-3 GM-% IV SOLR: 2.0000 g | INTRAVENOUS | Status: DC

## 2013-12-08 ENCOUNTER — Ambulatory Visit (HOSPITAL_COMMUNITY)
Admission: RE | Admit: 2013-12-08 | Discharge: 2013-12-08 | Disposition: A | Discharge status: Home / Self Care | Payer: Medicare Other | Source: Ambulatory Visit | Attending: Cardiovascular Disease | Admitting: Cardiovascular Disease

## 2013-12-08 ENCOUNTER — Other Ambulatory Visit: Payer: Self-pay

## 2013-12-08 ENCOUNTER — Encounter (HOSPITAL_COMMUNITY)
Admission: RE | Disposition: A | Discharge status: Home / Self Care | Payer: Self-pay | Source: Ambulatory Visit | Attending: Cardiovascular Disease

## 2013-12-08 DIAGNOSIS — Z9049 Acquired absence of other specified parts of digestive tract: Secondary | ICD-10-CM | POA: Insufficient documentation

## 2013-12-08 DIAGNOSIS — I350 Nonrheumatic aortic (valve) stenosis: Secondary | ICD-10-CM

## 2013-12-08 DIAGNOSIS — I2510 Atherosclerotic heart disease of native coronary artery without angina pectoris: Secondary | ICD-10-CM | POA: Insufficient documentation

## 2013-12-08 DIAGNOSIS — E785 Hyperlipidemia, unspecified: Secondary | ICD-10-CM | POA: Diagnosis not present

## 2013-12-08 DIAGNOSIS — I272 Other secondary pulmonary hypertension: Secondary | ICD-10-CM | POA: Insufficient documentation

## 2013-12-08 DIAGNOSIS — I10 Essential (primary) hypertension: Secondary | ICD-10-CM | POA: Insufficient documentation

## 2013-12-08 DIAGNOSIS — Z952 Presence of prosthetic heart valve: Secondary | ICD-10-CM | POA: Insufficient documentation

## 2013-12-08 DIAGNOSIS — Z7951 Long term (current) use of inhaled steroids: Secondary | ICD-10-CM | POA: Diagnosis not present

## 2013-12-08 HISTORY — PX: LEFT AND RIGHT HEART CATHETERIZATION WITH CORONARY ANGIOGRAM: SHX5449

## 2013-12-08 LAB — POCT I-STAT 3, ART BLOOD GAS (G3+)
Acid-Base Excess: 1 mmol/L (ref 0.0–2.0)
BICARBONATE: 26.2 meq/L — AB (ref 20.0–24.0)
O2 SAT: 93 %
PCO2 ART: 45.7 mmHg — AB (ref 35.0–45.0)
TCO2: 28 mmol/L (ref 0–100)
pH, Arterial: 7.367 (ref 7.350–7.450)
pO2, Arterial: 71 mmHg — ABNORMAL LOW (ref 80.0–100.0)

## 2013-12-08 LAB — POCT I-STAT 3, VENOUS BLOOD GAS (G3P V)
Acid-Base Excess: 2 mmol/L (ref 0.0–2.0)
BICARBONATE: 28.5 meq/L — AB (ref 20.0–24.0)
O2 SAT: 70 %
PO2 VEN: 39 mmHg (ref 30.0–45.0)
TCO2: 30 mmol/L (ref 0–100)
pCO2, Ven: 50 mmHg (ref 45.0–50.0)
pH, Ven: 7.364 — ABNORMAL HIGH (ref 7.250–7.300)

## 2013-12-08 SURGERY — LEFT AND RIGHT HEART CATHETERIZATION WITH CORONARY ANGIOGRAM
Anesthesia: LOCAL

## 2013-12-08 MED ORDER — ACETAMINOPHEN 325 MG PO TABS: 650.0000 mg | ORAL_TABLET | ORAL | Status: DC | PRN

## 2013-12-08 MED ORDER — HEPARIN SODIUM (PORCINE) 1000 UNIT/ML IJ SOLN
INTRAMUSCULAR | Status: AC
  Filled 2013-12-08: qty 1

## 2013-12-08 MED ORDER — SODIUM CHLORIDE 0.9 % IJ SOLN: 3.0000 mL | INTRAMUSCULAR | Status: DC | PRN

## 2013-12-08 MED ORDER — FENTANYL CITRATE 0.05 MG/ML IJ SOLN
INTRAMUSCULAR | Status: AC
  Filled 2013-12-08: qty 2

## 2013-12-08 MED ORDER — ASPIRIN 81 MG PO CHEW
CHEWABLE_TABLET | ORAL | Status: AC
  Administered 2013-12-08: 81 mg
  Filled 2013-12-08: qty 1

## 2013-12-08 MED ORDER — NITROGLYCERIN 1 MG/10 ML FOR IR/CATH LAB
INTRA_ARTERIAL | Status: AC
  Filled 2013-12-08: qty 10

## 2013-12-08 MED ORDER — HEPARIN (PORCINE) IN NACL 2-0.9 UNIT/ML-% IJ SOLN
INTRAMUSCULAR | Status: AC
  Filled 2013-12-08: qty 1000

## 2013-12-08 MED ORDER — SODIUM CHLORIDE 0.9 % IV SOLN
INTRAVENOUS | Status: DC
  Administered 2013-12-08: 14:00:00 via INTRAVENOUS

## 2013-12-08 MED ORDER — LIDOCAINE HCL (PF) 1 % IJ SOLN
INTRAMUSCULAR | Status: AC
  Filled 2013-12-08: qty 30

## 2013-12-08 MED ORDER — ONDANSETRON HCL 4 MG/2ML IJ SOLN
4.0000 mg | Freq: Four times a day (QID) | INTRAMUSCULAR | Status: DC | PRN
Start: 2013-12-08 — End: 2013-12-08

## 2013-12-08 MED ORDER — MIDAZOLAM HCL 2 MG/2ML IJ SOLN
INTRAMUSCULAR | Status: AC
  Filled 2013-12-08: qty 2

## 2013-12-08 MED ORDER — VERAPAMIL HCL 2.5 MG/ML IV SOLN
INTRAVENOUS | Status: AC
  Filled 2013-12-08: qty 2

## 2013-12-08 MED ORDER — SODIUM CHLORIDE 0.9 % IV SOLN: 1.0000 mL/kg/h | INTRAVENOUS | Status: DC

## 2013-12-08 NOTE — Discharge Instructions (Signed)
Radial Site Care °Refer to this sheet in the next few weeks. These instructions provide you with information on caring for yourself after your procedure. Your caregiver may also give you more specific instructions. Your treatment has been planned according to current medical practices, but problems sometimes occur. Call your caregiver if you have any problems or questions after your procedure. °HOME CARE INSTRUCTIONS °· You may shower the day after the procedure. Remove the bandage (dressing) and gently wash the site with plain soap and water. Gently pat the site dry. °· Do not apply powder or lotion to the site. °· Do not submerge the affected site in water for 3 to 5 days. °· Inspect the site at least twice daily. °· Do not flex or bend the affected arm for 24 hours. °· No lifting over 5 pounds (2.3 kg) for 5 days after your procedure. °· Do not drive home if you are discharged the same day of the procedure. Have someone else drive you. °· You may drive 24 hours after the procedure unless otherwise instructed by your caregiver. °· Do not operate machinery or power tools for 24 hours. °· A responsible adult should be with you for the first 24 hours after you arrive home. °What to expect: °· Any bruising will usually fade within 1 to 2 weeks. °· Blood that collects in the tissue (hematoma) may be painful to the touch. It should usually decrease in size and tenderness within 1 to 2 weeks. °SEEK IMMEDIATE MEDICAL CARE IF: °· You have unusual pain at the radial site. °· You have redness, warmth, swelling, or pain at the radial site. °· You have drainage (other than a small amount of blood on the dressing). °· You have chills. °· You have a fever or persistent symptoms for more than 72 hours. °· You have a fever and your symptoms suddenly get worse. °· Your arm becomes pale, cool, tingly, or numb. °· You have heavy bleeding from the site. Hold pressure on the site. °Document Released: 03/10/2010 Document Revised:  04/30/2011 Document Reviewed: 03/10/2010 °Exit[employer]® Patient Information ©2015 [employer]. This information is not intended to replace advice given to you by your health care provider. Make sure you discuss any questions you have with your health care provider. ° °

## 2013-12-08 NOTE — Progress Notes (Signed)
TR BAND REMOVAL  LOCATION:    right radial  DEFLATED PER PROTOCOL:    Yes.    TIME BAND OFF / DRESSING APPLIED:    1800   SITE UPON ARRIVAL:    Level 0  SITE AFTER BAND REMOVAL:    Level 0  CIRCULATION SENSATION AND MOVEMENT:    Within Normal Limits   Yes.    COMMENTS:   TRB REMOVED/ TEGADERM DSG APPLIED

## 2013-12-08 NOTE — H&P (View-Only) (Signed)
Patient ID: [name], female   DOB: 1925-09-30, 80 y.o.   MRN: [id number]     Reason for office visit Aortic stenosis  [name] is 80 years old and is referred to discuss the findings of an echocardiogram that showed severe aortic stenosis (mean gradient 50 mm Hg). She denies problems with exertional dyspnea, exertional chest pain or syncope but seems to be minimizing her symptoms. Digging a little deeper into her complaints, with her family present, it seems that she has some shortness of breath. At most she has NYHA functional class II dyspnea. She lives independently, takes care of all her household chores, drive this and is very active. Her right knee pain slows her down.  Her echo showed normal left ventricular systolic function, severe left ventricular hypertrophy and mildly dilated left atrium. There was mild diastolic dysfunction.  The echocardiogram is performed as part of preoperative evaluation before undergoing total right knee replacement (Dr. Percell Miller).   Allergies  Allergen Reactions  . Aspirin Nausea And Vomiting  . Codeine Nausea And Vomiting    Current Outpatient Prescriptions  Medication Sig Dispense Refill  . amLODipine (NORVASC) 10 MG tablet Take 10 mg by mouth daily.      . carvedilol (COREG) 12.5 MG tablet Take 12.5 mg by mouth 2 (two) times daily.       . cholecalciferol (VITAMIN D) 1000 UNITS tablet Take 1,000 Units by mouth daily.      . fenofibrate micronized (LOFIBRA) 200 MG capsule Take 200 mg by mouth daily before breakfast.      . fish oil-omega-3 fatty acids 1000 MG capsule Take 1 g by mouth daily. With 300 mg of Omega      . fluticasone (VERAMYST) 27.5 MCG/SPRAY nasal spray Place 1 spray into both nostrils 2 (two) times daily as needed for allergies.       . hydrochlorothiazide (HYDRODIURIL) 25 MG tablet Take 25 mg by mouth daily.      Marland Kitchen omeprazole (PRILOSEC) 40 MG capsule Take 40 mg by mouth daily.       No current facility-administered  medications for this visit.    Past Medical History  Diagnosis Date  . Hypertension   . Hyperlipidemia     Past Surgical History  Procedure Laterality Date  . Cholecystectomy  2008  . Tonsillectomy      70 years ago    Family History  Problem Relation Age of Onset  . Breast cancer Sister     2 sisters  . Colon cancer Neg Hx   . Muscular dystrophy Father     Died at 27    History   Social History  . Marital Status: Widowed    Spouse Name: N/A    Number of Children: 3  . Years of Education: N/A   Occupational History  . Not on file.   Social History Main Topics  . Smoking status: Never Smoker   . Smokeless tobacco: Not on file  . Alcohol Use: No  . Drug Use: No  . Sexual Activity: Not on file   Other Topics Concern  . Not on file   Social History Narrative  . No narrative on file    Review of systems: The patient specifically denies any chest pain at rest or with exertion, dyspnea at rest or with exertion, orthopnea, paroxysmal nocturnal dyspnea, syncope, palpitations, focal neurological deficits, intermittent claudication, lower extremity edema, unexplained weight gain, cough, hemoptysis or wheezing.  The patient also denies abdominal pain, nausea,  vomiting, dysphagia, diarrhea, constipation, polyuria, polydipsia, dysuria, hematuria, frequency, urgency, abnormal bleeding or bruising, fever, chills, unexpected weight changes, mood swings, change in skin or hair texture, change in voice quality, auditory or visual problems, allergic reactions or rashes, new musculoskeletal complaints other than usual "aches and pains".   PHYSICAL EXAM BP 152/78  Pulse 83  Resp 16  Ht 5\' 4"  (1.626 m)  Wt 65.499 kg (144 lb 6.4 oz)  BMI 24.77 kg/m2  General: Alert, oriented x3, no distress Head: no evidence of trauma, PERRL, EOMI, no exophtalmos or lid lag, no myxedema, no xanthelasma; normal ears, nose and oropharynx Neck: normal jugular venous pulsations and no  hepatojugular reflux; brisk carotid pulses without delay I would allow carotid bruits that appear to radiate from the chest Chest: clear to auscultation, no signs of consolidation by percussion or palpation, normal fremitus, symmetrical and full respiratory excursions Cardiovascular: normal position and quality of the apical impulse, regular rhythm, normal first and second heart sounds, no rubs or gallops. Is a grade 3/6 mid peaking systolic murmur in the aortic focus radiating to the neck Abdomen: no tenderness or distention, no masses by palpation, no abnormal pulsatility or arterial bruits, normal bowel sounds, no hepatosplenomegaly Extremities: no clubbing, cyanosis or edema; 2+ radial, ulnar and brachial pulses bilaterally; 2+ right femoral, posterior tibial and dorsalis pedis pulses; 2+ left femoral, posterior tibial and dorsalis pedis pulses; no subclavian or femoral bruits Neurological: grossly nonfocal   EKG: Normal sinus rhythm with small Q waves in the inferior leads BMET    Component Value Date/Time   NA 137 11/30/2013 1608   K 4.3 11/30/2013 1608   CL 98 11/30/2013 1608   CO2 28 11/30/2013 1608   GLUCOSE 118* 11/30/2013 1608   BUN 20 11/30/2013 1608   CREATININE 1.06 11/30/2013 1608   CREATININE 0.8 04/02/2007 1024   CALCIUM 10.3 11/30/2013 1608   GFRNONAA 74 04/02/2007 1024   GFRAA 89 04/02/2007 1024     ASSESSMENT AND PLAN  [name] has good functional status but appears to have severe/critical aortic stenosis with early symptoms of valvular heart disease. Notwithstanding her advanced age, she is probably an excellent candidate for aortic valve replacement. Right and left heart catheterization is recommended both to corroborate the diagnosis and to see whether or not there is concomitant coronary disease. Unfortunately, her knee surgery will need to be placed on hold until the aortic valve issue is clarified and treated.  The cardiac cath procedure has been fully reviewed  with the patient and informed consent has been obtained.   Orders Placed This Encounter  Procedures  . APTT  . Basic metabolic panel  . Protime-INR  . CBC  . LEFT AND RIGHT HEART CATHETERIZATION WITH CORONARY ANGIOGRAM   No orders of the defined types were placed in this encounter.    Holli Humbles, MD, [place] [phone] office [phone] pager

## 2013-12-08 NOTE — Op Note (Signed)
CARDIAC CATHETERIZATION REPORT   Procedures performed:  1. Left heart catheterization  2. Selective coronary angiography  3. Left ventriculography   Reason for procedure:  Valvular heart disease: severe aortic stenosis  Procedure performed by: Sanda Klein, MD, [facility]  Complications: none   Estimated blood loss: less than 5 mL   History:  [name] is 80 years old and is referred to discuss the findings of an echocardiogram that showed severe aortic stenosis (mean gradient 50 mm Hg). She denies problems with exertional dyspnea, exertional chest pain or syncope but seems to be minimizing her symptoms. She is limited by right knee pain and wants to have a knee replacement. At most she has NYHA functional class II dyspnea. She lives independently, takes care of all her household chores, drives and is very active. Her echo showed normal left ventricular systolic function, severe left ventricular hypertrophy and mildly dilated left atrium. There was mild diastolic dysfunction.  Consent: The risks, benefits, and details of the procedure were explained to the patient. Risks including death, MI, stroke, bleeding, limb ischemia, renal failure and allergy were described and accepted by the patient. Informed written consent was obtained prior to proceeding.  Technique: The patient was brought to the cardiac catheterization laboratory in the fasting state. She was prepped and draped in the usual sterile fashion. A right antecubital IV was exchanged for a 68F sheath and a 68F balloon tipped PA catheter was used to record right heart pressures. Local anesthesia with 1% lidocaine was administered to the right wrist area. Using the modified Seldinger technique a 5 French right radial artery sheath was introduced without difficulty. Under fluoroscopic guidance, using 5 French AL1, TIG and JR catheters, selective cannulation of the left coronary artery, right coronary artery and left ventricle were respectively  performed (the latter with the JR catheter and a straight guidewire). Several coronary angiograms in a variety of projections were recorded. Left ventricular pressure and a pull back to the aorta were recorded. No immediate complications occurred. At the end of the procedure, all catheters were removed. After the procedure, hemostasis will be achieved with manual pressure/TR band.  Contrast used: 85 mL Omnipaque  Hemodynamic findings:  Aortic pressure 140/60 (mean 90) mm Hg   Left ventricle [id number] with end-diastolic pressure of 20 mm Hg  PA wedge pressure a wave 18, v wave 13 (mean 10) mm Hg  Pulmonary artery 45/20 (mean 31) mm Hg  Right ventricle 48/4 with an end-diastolic pressure of 10 mm Hg  Right atrium a wave 13, v wave 10 (mean 8) mm Hg  Cardiac output is 5.7 L per minute (cardiac index 3.4 L per minute per meter sq) Ao sat 93%, PA sat70% SVR 970 dsc(1610 dsci) PVR 436 (723 dsci)  Aortic valve mean gradient 37 mm Hg, AVA 0.77 cm sq   Angiographic Findings:  1. The left main coronary artery exhibits mild atherosclerosis and trifurcates into the left anterior descending artery, a large ramus intermedius artery and left circumflex coronary artery. There is a <30% stenosis in the distal left main.  2. The left anterior descending artery is a large vessel that reaches the apex and generates a single major diagonal branch. There is evidence of mild luminal irregularities and minimal calcification. No hemodynamically meaningful stenoses are seen. 3. The left circumflex coronary artery is a medium-size vessel non dominant vessel that generates two major oblique marginal arteries. There is evidence of mild luminal irregularities and no calcification. No hemodynamically meaningful stenoses are seen. The  ramus intermedius artery is large and bifurcates, supplying most of the lateral wall. It is free of major stenoses. 4. The right coronary artery is a large-size dominant vessel that has a  slightly anomalous high and and anterior sgenerates a long posterior lateral ventricular system as well as the PDA. There is evidence of moderate luminal irregularities and mild calcification. There is a 30% proximal lesion, but no hemodynamically meaningful stenoses are seen.  5. The left ventricle is normal in size and systolic function by echo. The ascending aorta appears normal. There is severe aortic valve stenosis by pullback. The left ventricular end-diastolic pressure is elevated at 20 mm Hg.    IMPRESSIONS:  Severe aortic stenosis. Mild coronary atherosclerosis, but without significant stenoses. Elevated LVEDP, mild pulmonary artery HTN RECOMMENDATION:  Aortic valve replacement with a biological prosthesis.    Sanda Klein, MD, [facility] [phone] office [phone] pager

## 2013-12-08 NOTE — Interval H&P Note (Signed)
History and Physical Interval Note:  12/08/2013 2:00 PM  [name]  has presented today for surgery, with the diagnosis of aortic stenosis  The various methods of treatment have been discussed with the patient and family. After consideration of risks, benefits and other options for treatment, the patient has consented to  Procedure(s): LEFT AND RIGHT HEART CATHETERIZATION WITH CORONARY ANGIOGRAM (N/A) as a surgical intervention .  The patient's history has been reviewed, patient examined, no change in status, stable for surgery.  I have reviewed the patient's chart and labs.  Questions were answered to the patient's satisfaction.     Rubylee Zamarripa

## 2013-12-09 ENCOUNTER — Inpatient Hospital Stay (HOSPITAL_COMMUNITY): Admission: RE | Admit: 2013-12-09 | Payer: Medicare Other | Source: Ambulatory Visit | Admitting: Orthopedic Surgery

## 2013-12-09 ENCOUNTER — Encounter (HOSPITAL_COMMUNITY): Admission: RE | Payer: Self-pay | Source: Ambulatory Visit

## 2013-12-09 SURGERY — ARTHROPLASTY, KNEE, TOTAL
Anesthesia: General | Laterality: Right

## 2013-12-14 ENCOUNTER — Encounter: Payer: Self-pay | Admitting: Thoracic Surgery (Cardiothoracic Vascular Surgery)

## 2013-12-14 ENCOUNTER — Institutional Professional Consult (permissible substitution): Payer: Medicare Other | Admitting: Internal Medicine

## 2013-12-14 ENCOUNTER — Institutional Professional Consult (permissible substitution) (INDEPENDENT_AMBULATORY_CARE_PROVIDER_SITE_OTHER): Payer: Medicare Other | Admitting: Thoracic Surgery (Cardiothoracic Vascular Surgery)

## 2013-12-14 VITALS — BP 155/67 | HR 79 | Resp 16 | Ht 62.0 in | Wt 144.0 lb

## 2013-12-14 DIAGNOSIS — M1990 Unspecified osteoarthritis, unspecified site: Secondary | ICD-10-CM | POA: Insufficient documentation

## 2013-12-14 DIAGNOSIS — I5032 Chronic diastolic (congestive) heart failure: Secondary | ICD-10-CM | POA: Insufficient documentation

## 2013-12-14 DIAGNOSIS — I350 Nonrheumatic aortic (valve) stenosis: Secondary | ICD-10-CM | POA: Insufficient documentation

## 2013-12-14 NOTE — Progress Notes (Signed)
[address]       [place] [address]             [phone]     CARDIOTHORACIC SURGERY CONSULTATION REPORT  Referring Provider is Croitoru, Dani Gobble, MD PCP is Laurel Dimmer, MD  Chief Complaint  Patient presents with  . Aortic Stenosis    eval and treat...2D ECHO 11/23/13, CATH 11/20/13    HPI:  Patient is an 80 year old widowed white female from [place] with long-standing history of heart murmur, hypertension, hyperlipidemia, and GE reflux disease who has been referred for possible surgical intervention for management of severe symptomatic aortic stenosis. The patient states that she has been told that she has had a heart murmur nearly all of her life. However, she has never been formally evaluated by a cardiologist until recently. She has developed progressive degenerative arthritis involving both knees and recently she was evaluated for possible right total knee replacement by Dr. Percell Miller. An echocardiogram was obtained to evaluate the patient's heart murmur and found to demonstrate findings consistent with critical aortic stenosis.  Peak velocity across the aortic valve measured in excess of 4.5 m/s corresponding to peak and mean transvalvular gradients estimated to be 81 and 47 mmHg, respectively.  Left ventricular systolic function remains normal with ejection fraction estimated 65%. The patient was referred to Dr. Sallyanne Kuster who performed left and right heart catheterization 12/08/2013. The patient was found to have severe aortic stenosis with mean transvalvular gradient measured at catheterization 37 mmHg corresponding to calculated aortic valve area 0.77 cm. The patient had nonobstructive coronary artery disease and mild pulmonary hypertension.  She was referred for elective surgical consultation.  The patient is widowed and has been living alone for nearly 25 years. Despite her advanced age she remains functionally independent. She drives an automobile  and tends to all of her basic daily needs. She admits to a 6 month history of progressive exertional shortness of breath and fatigue. She states that she has been "slowing down" considerably.  While she is doing chores around the house she now has to take rest breaks frequently. She denies symptoms of resting shortness of breath although she does occasionally wake up in the middle of night feeling short of breath. She cannot sleep lying flat on her back. She has not had exertional chest pain or chest tightness. She denies lower extremity edema. She has intermittent palpitations without dizzy spells or syncope. She has additionally been limited considerably because of progressive degenerative arthritis involving both knees. This has been going on for many years but has gotten considerably worse over the past year. Her right knee is considerably worse than the left. She is still able to ambulate without a cane or walker but her mobility is somewhat limited.  Past Medical History  Diagnosis Date  . Hypertension   . Hyperlipidemia   . GERD (gastroesophageal reflux disease) 05/14/2011  . Arthritis     Both knees R>L  . Chronic diastolic congestive heart failure, NYHA class 2   . Severe aortic stenosis 12/06/2013    Past Surgical History  Procedure Laterality Date  . Cholecystectomy  2008  . Tonsillectomy      70 years ago    Family History  Problem Relation Age of Onset  . Breast cancer Sister     2 sisters  . Colon cancer Neg Hx   . Muscular dystrophy Father     Died at 64    History  Social History  . Marital Status: Widowed    Spouse Name: N/A    Number of Children: 3  . Years of Education: N/A   Occupational History  . Not on file.   Social History Main Topics  . Smoking status: Never Smoker   . Smokeless tobacco: Not on file  . Alcohol Use: No  . Drug Use: No  . Sexual Activity: Not on file   Other Topics Concern  . Not on file   Social History Narrative   Widowed x  25 yrs,  Retired from [employer], lives alone - functionally independent    Current Outpatient Prescriptions  Medication Sig Dispense Refill  . amLODipine (NORVASC) 10 MG tablet Take 10 mg by mouth daily.      . carvedilol (COREG) 12.5 MG tablet Take 12.5 mg by mouth 2 (two) times daily.       . fenofibrate micronized (LOFIBRA) 200 MG capsule Take 200 mg by mouth daily before breakfast.      . fish oil-omega-3 fatty acids 1000 MG capsule Take 1 g by mouth daily. With 300 mg of Omega      . fluticasone (VERAMYST) 27.5 MCG/SPRAY nasal spray Place 1 spray into both nostrils 2 (two) times daily as needed for allergies.       . hydrochlorothiazide (HYDRODIURIL) 25 MG tablet Take 25 mg by mouth daily.      Marland Kitchen omeprazole (PRILOSEC) 40 MG capsule Take 40 mg by mouth daily.      . cholecalciferol (VITAMIN D) 1000 UNITS tablet Take 1,000 Units by mouth daily.       No current facility-administered medications for this visit.    Allergies  Allergen Reactions  . Aspirin Nausea And Vomiting  . Codeine Nausea And Vomiting      Review of Systems:   General:  normal appetite, decreased energy, no weight gain, no weight loss, no fever  Cardiac:  no chest pain with exertion, no chest pain at rest, + SOB with exertion, no resting SOB, + PND, + orthopnea, + palpitations, no arrhythmia, no atrial fibrillation, no LE edema, no dizzy spells, no syncope  Respiratory:  + exertional shortness of breath, no home oxygen, no productive cough, no dry cough, no bronchitis, no wheezing, no hemoptysis, no asthma, no pain with inspiration or cough, + sleep apnea, no CPAP at night  GI:   no difficulty swallowing, + reflux, + frequent heartburn, no hiatal hernia, no abdominal pain, no constipation, no diarrhea, no hematochezia, no hematemesis, no melena  GU:   no dysuria,  + frequency, + recent urinary tract infection, no hematuria, no kidney stones, no kidney disease  Vascular:  no pain suggestive of  claudication, no pain in feet, no leg cramps, no varicose veins, no DVT, no non-healing foot ulcer  Neuro:   no stroke, no TIA's, no seizures, no headaches, no temporary blindness one eye,  no slurred speech, no peripheral neuropathy, + chronic pain, no instability of gait, no memory/cognitive dysfunction  Musculoskeletal: + arthritis, + joint swelling, no myalgias, + difficulty walking, somewhat decreased mobility   Skin:   no rash, no itching, no skin infections, no pressure sores or ulcerations  Psych:   + anxiety, no depression, + nervousness, + unusual recent stress  Eyes:   no blurry vision, no floaters, no recent vision changes, + wears glasses or contacts  ENT:   + hearing loss, no loose or painful teeth, no dentures, last saw dentist within the past year  Hematologic:  no easy bruising, no abnormal bleeding, no clotting disorder, no frequent epistaxis  Endocrine:  no diabetes, does not check CBG's at home     Physical Exam:   BP 155/67  Pulse 79  Resp 16  Ht 5\' 2"  (1.575 m)  Wt 144 lb (65.318 kg)  BMI 26.33 kg/m2  SpO2 98%  General:  Elderly but well-appearing  HEENT:  Unremarkable   Neck:   no JVD, no bruits, no adenopathy   Chest:   clear to auscultation, symmetrical breath sounds, no wheezes, no rhonchi   CV:   RRR, III/VI late-peaking crescendo/decrescendo systolic murmur   Abdomen:  soft, non-tender, no masses   Extremities:  warm, well-perfused, pulses diminished but palpable, no LE edema  Rectal/GU  Deferred  Neuro:   Grossly non-focal and symmetrical throughout  Skin:   Clean and dry, no rashes, no breakdown   Diagnostic Tests:  Transthoracic Echocardiography  Patient: Shizuko, Dake MR #: [id number] Study Date: 11/23/2013 Gender: F Age: 48 Height: 162.6 cm Weight: 65.8 kg BSA: 1.73 m^2 Pt. Status: Room:  ATTENDING Lupita Leash REFERRING Brett Canales SONOGRAPHER Marygrace Drought, RCS PERFORMING [facility],  Outpatient  cc:  ------------------------------------------------------------------- LV EF: 65% - 70%  ------------------------------------------------------------------- Indications: 785.2 Cardiac murmur.  ------------------------------------------------------------------- History: PMH: Hyperlipidemia, Pre-OP. Risk factors: Hypertension.  ------------------------------------------------------------------- Study Conclusions  - Left ventricle: The cavity size was normal. Wall thickness was increased in a pattern of severe LVH. Systolic function was vigorous. The estimated ejection fraction was in the range of 65% to 70%. Doppler parameters are consistent with abnormal left ventricular relaxation (grade 1 diastolic dysfunction). - Aortic valve: AV is thckened, calcified with restricted motion. Peak and mean gradients through the valve are 90 and 50 mm Hg respectively consistent with critical AS. - Left atrium: The atrium was mildly dilated. - Pulmonary arteries: PA peak pressure: 68 mm Hg (S).  Transthoracic echocardiography. M-mode, complete 2D, spectral Doppler, and color Doppler. Birthdate: Patient birthdate: 20-Mar-1925. Age: Patient is 80 yr old. Sex: Gender: female. BMI: 24.9 kg/m^2. Blood pressure: 154/64 Patient status: Outpatient. Study date: Study date: 11/23/2013. Study time: 02:30 PM. Location: Echo laboratory.  -------------------------------------------------------------------  ------------------------------------------------------------------- Left ventricle: The cavity size was normal. Wall thickness was increased in a pattern of severe LVH. Systolic function was vigorous. The estimated ejection fraction was in the range of 65% to 70%. Doppler parameters are consistent with abnormal left ventricular relaxation (grade 1 diastolic dysfunction).  ------------------------------------------------------------------- Aortic valve: AV is thckened, calcified with  restricted motion. Peak and mean gradients through the valve are 90 and 50 mm Hg respectively consistent with critical AS. Doppler: VTI ratio of LVOT to aortic valve: 0.35. Valve area (VTI): 1.11 cm^2. Indexed valve area (VTI): 0.64 cm^2/m^2. Valve area (Vmax): 1.04 cm^2. Indexed valve area (Vmax): 0.6 cm^2/m^2. Mean velocity ratio of LVOT to aortic valve: 0.39. Valve area (Vmean): 1.22 cm^2. Indexed valve area (Vmean): 0.7 cm^2/m^2. Mean gradient (S): 47 mm Hg. Peak gradient (S): 81 mm Hg.  ------------------------------------------------------------------- Mitral valve: Calcified annulus. Moderately thickened leaflets . Doppler: There was trivial regurgitation. Valve area by pressure half-time: 2.5 cm^2. Indexed valve area by pressure half-time: 1.44 cm^2/m^2. Valve area by continuity equation (using LVOT flow): 2.69 cm^2. Indexed valve area by continuity equation (using LVOT flow): 1.55 cm^2/m^2. Mean gradient (D): 4 mm Hg. Peak gradient (D): 4 mm Hg.  ------------------------------------------------------------------- Left atrium: LA Volume/ BSA = 40.9 ml/m2. The atrium was mildly dilated.  ------------------------------------------------------------------- Right ventricle: The cavity  size was normal. Wall thickness was normal. Systolic function was normal.  ------------------------------------------------------------------- Pulmonic valve: Poorly visualized. Doppler: There was no significant regurgitation.  ------------------------------------------------------------------- Tricuspid valve: Mildly thickened leaflets. Leaflet separation was normal. Doppler: Transvalvular velocity was within the normal range. There was mild regurgitation.  ------------------------------------------------------------------- Pulmonary artery: Poorly visualized.  ------------------------------------------------------------------- Right atrium: The atrium was normal in  size.  ------------------------------------------------------------------- Pericardium: There was no pericardial effusion.  ------------------------------------------------------------------- Systemic veins: Inferior vena cava: The vessel was normal in size. The respirophasic diameter changes were in the normal range (= 50%), consistent with normal central venous pressure. Diameter: 12 mm.  ------------------------------------------------------------------- Measurements  IVC Value Reference ID 12 mm ---------  Left ventricle Value Reference LV ID, ED, PLAX chordal (L) 35.9 mm 43 - 52 LV ID, ES, PLAX chordal 26.4 mm 23 - 38 LV fx shortening, PLAX chordal (L) 26 % >=29 LV PW thickness, ED 12.5 mm --------- IVS/LV PW ratio, ED (H) 1.46 <=1.3 Stroke volume, 2D 114 ml --------- Stroke volume/bsa, 2D 66 ml/m^2 --------- LV e&', lateral 6.91 cm/s --------- LV E/e&', lateral 15.05 --------- LV e&', medial 5.59 cm/s --------- LV E/e&', medial 18.6 --------- LV e&', average 6.25 cm/s --------- LV E/e&', average 16.64 ---------  Ventricular septum Value Reference IVS thickness, ED 18.2 mm ---------  LVOT Value Reference LVOT ID, S 20 mm --------- LVOT area 3.14 cm^2 --------- LVOT peak velocity, S 149 cm/s --------- LVOT mean velocity, S 122 cm/s --------- LVOT VTI, S 36.3 cm --------- LVOT peak gradient, S 9 mm Hg ---------  Aortic valve Value Reference Aortic valve mean velocity, S 315 cm/s --------- Aortic valve VTI, S 103 cm --------- Aortic mean gradient, S 47 mm Hg --------- Aortic peak gradient, S 81 mm Hg --------- VTI ratio, LVOT/AV 0.35 --------- Aortic valve area, VTI 1.11 cm^2 --------- Aortic valve area/bsa, VTI 0.64 cm^2/m^2 --------- Aortic valve area, peak velocity 1.04 cm^2 --------- Aortic valve area/bsa, peak 0.6 cm^2/m^2 --------- velocity Velocity ratio, mean, LVOT/AV 0.39 --------- Aortic valve area, mean velocity 1.22 cm^2 --------- Aortic valve  area/bsa, mean 0.7 cm^2/m^2 --------- velocity  Aorta Value Reference Aortic root ID, ED 30 mm ---------  Left atrium Value Reference LA ID, A-P, ES 40 mm --------- LA ID/bsa, A-P (H) 2.31 cm/m^2 <=2.2 LA volume, ES, 1-p A4C 70 ml --------- LA volume/bsa, ES, 1-p A4C 40.4 ml/m^2 --------- LA volume, ES, 1-p A2C 70 ml --------- LA volume/bsa, ES, 1-p A2C 40.4 ml/m^2 ---------  Mitral valve Value Reference Mitral E-wave peak velocity 104 cm/s --------- Mitral A-wave peak velocity 160 cm/s --------- Mitral mean velocity, D 91.6 cm/s --------- Mitral deceleration time (H) 271 ms 150 - 230 Mitral pressure half-time 85 ms --------- Mitral mean gradient, D 4 mm Hg --------- Mitral peak gradient, D 4 mm Hg --------- Mitral E/A ratio, peak 0.7 --------- Mitral valve area, PHT, DP 2.5 cm^2 --------- Mitral valve area/bsa, PHT, DP 1.44 cm^2/m^2 --------- Mitral valve area, LVOT 2.69 cm^2 --------- continuity Mitral valve area/bsa, LVOT 1.55 cm^2/m^2 --------- continuity Mitral annulus VTI, D 42.4 cm ---------  Pulmonary arteries Value Reference PA pressure, S, DP (H) 68 mm Hg <=30  Tricuspid valve Value Reference Tricuspid regurg peak velocity 404 cm/s --------- Tricuspid peak RV-RA gradient 65 mm Hg --------- Tricuspid maximal regurg 404 cm/s --------- velocity, PISA  Systemic veins Value Reference Estimated CVP 3 mm Hg ---------  Right ventricle Value Reference RV pressure, S, DP (H) 68 mm Hg <=30 RV s&', lateral, S 10.5 cm/s ---------  Legend: (L) and (H)  mark values outside specified reference range.  ------------------------------------------------------------------- Prepared and Electronically Authenticated by  Dorris Carnes, M.D. 2015-10-05T18:20:52    CARDIAC CATHETERIZATION REPORT   Procedures performed:  1. Left heart catheterization  2. Selective coronary angiography  3. Left ventriculography  Reason for procedure:  Valvular heart disease: severe aortic  stenosis  Procedure performed by: Sanda Klein, MD, [facility]  Complications: none  Estimated blood loss: less than 5 mL  History: Mrs. Krogstad is 80 years old and is referred to discuss the findings of an echocardiogram that showed severe aortic stenosis (mean gradient 50 mm Hg). She denies problems with exertional dyspnea, exertional chest pain or syncope but seems to be minimizing her symptoms. She is limited by right knee pain and wants to have a knee replacement. At most she has NYHA functional class II dyspnea. She lives independently, takes care of all her household chores, drives and is very active. Her echo showed normal left ventricular systolic function, severe left ventricular hypertrophy and mildly dilated left atrium. There was mild diastolic dysfunction.  Consent: The risks, benefits, and details of the procedure were explained to the patient. Risks including death, MI, stroke, bleeding, limb ischemia, renal failure and allergy were described and accepted by the patient. Informed written consent was obtained prior to proceeding.  Technique: The patient was brought to the cardiac catheterization laboratory in the fasting state. She was prepped and draped in the usual sterile fashion. A right antecubital IV was exchanged for a 62F sheath and a 62F balloon tipped PA catheter was used to record right heart pressures. Local anesthesia with 1% lidocaine was administered to the right wrist area. Using the modified Seldinger technique a 5 French right radial artery sheath was introduced without difficulty. Under fluoroscopic guidance, using 5 French AL1, TIG and JR catheters, selective cannulation of the left coronary artery, right coronary artery and left ventricle were respectively performed (the latter with the JR catheter and a straight guidewire). Several coronary angiograms in a variety of projections were recorded. Left ventricular pressure and a pull back to the aorta were recorded. No immediate  complications occurred. At the end of the procedure, all catheters were removed. After the procedure, hemostasis will be achieved with manual pressure/TR band.  Contrast used: 85 mL Omnipaque  Hemodynamic findings:  Aortic pressure 140/60 (mean 90) mm Hg  Left ventricle [id number] with end-diastolic pressure of 20 mm Hg  PA wedge pressure a wave 18, v wave 13 (mean 10) mm Hg  Pulmonary artery 45/20 (mean 31) mm Hg  Right ventricle 48/4 with an end-diastolic pressure of 10 mm Hg  Right atrium a wave 13, v wave 10 (mean 8) mm Hg  Cardiac output is 5.7 L per minute (cardiac index 3.4 L per minute per meter sq)  Ao sat 93%, PA sat70%  SVR 970 dsc(1610 dsci)  PVR 436 (723 dsci)  Aortic valve mean gradient 37 mm Hg, AVA 0.77 cm sq  Angiographic Findings:  1. The left main coronary artery exhibits mild atherosclerosis and trifurcates into the left anterior descending artery, a large ramus intermedius artery and left circumflex coronary artery. There is a <30% stenosis in the distal left main.  2. The left anterior descending artery is a large vessel that reaches the apex and generates a single major diagonal branch. There is evidence of mild luminal irregularities and minimal calcification. No hemodynamically meaningful stenoses are seen.  3. The left circumflex coronary artery is a medium-size vessel non dominant vessel that generates two major  oblique marginal arteries. There is evidence of mild luminal irregularities and no calcification. No hemodynamically meaningful stenoses are seen.  The ramus intermedius artery is large and bifurcates, supplying most of the lateral wall. It is free of major stenoses.  4. The right coronary artery is a large-size dominant vessel that has a slightly anomalous high and and anterior sgenerates a long posterior lateral ventricular system as well as the PDA. There is evidence of moderate luminal irregularities and mild calcification. There is a 30% proximal lesion, but no  hemodynamically meaningful stenoses are seen.  5. The left ventricle is normal in size and systolic function by echo. The ascending aorta appears normal. There is severe aortic valve stenosis by pullback. The left ventricular end-diastolic pressure is elevated at 20 mm Hg.   IMPRESSIONS:  Severe aortic stenosis.  Mild coronary atherosclerosis, but without significant stenoses.  Elevated LVEDP, mild pulmonary artery HTN  RECOMMENDATION:  Aortic valve replacement with a biological prosthesis.     Sanda Klein, MD, [place]  [phone] office  [phone] pager    STS Risk Calculator  Procedure    AVR  Risk of Mortality   4.6% Morbidity or Mortality  21.0% Prolonged LOS   9.7% Short LOS    21.3% Permanent Stroke   2.5% Prolonged Vent Support  14.1% DSW Infection    0.2% Renal Failure    5.3% Reoperation    8.2%    Impression:  Patient has stage D severe symptomatic aortic stenosis.  She describes progressive symptoms of exertional shortness of breath and fatigue with the recent development of intermittent paroxysmal nocturnal dyspnea and orthopnea consistent with chronic diastolic congestive heart failure, New York Heart Association functional class 2-3.  I have personally reviewed the patient's recent transthoracic echocardiogram and diagnostic cardiac catheterization. Her aortic valve appears tricuspid with severe calcification and thickening involving all 3 leaflets. There is severe leaflet restriction and severe aortic stenosis with peak velocity across the aortic valve in excess of 4.5 m/sec.  Her aortic root is relatively small in size.  Left ventricular systolic function remains normal and cardiac catheterization is notable for the absence of significant coronary artery disease.  She has significant diastolic dysfunction and there was mild pulmonary hypertension noted at cath.  Risks associated with conventional surgical aortic valve replacement will be at  least moderately elevated because of the patient's advanced age.  I believe that risks associated with conventional surgery may be somewhat higher than that predicted using traditional risk models because the patient appears to have a relatively small aortic root which might require aortic root enlargement or root replacement to avoid the development of patient-prosthesis mismatch.  Furthermore, despite the fact that the patient remains functionally independent, her mobility has been limited recently because of progressive severe degenerative arthritis involving both knees.     Plan:  The patient and her daughter were counseled at length regarding treatment alternatives for management of severe symptomatic aortic stenosis. Alternative approaches such as conventional aortic valve replacement, transcatheter aortic valve replacement, and palliative medical therapy were compared and contrasted at length.  The risks associated with conventional surgical aortic valve replacement were been discussed in detail, as were expectations for post-operative convalescence. Long-term prognosis with medical therapy was discussed. This discussion was placed in the context of the patient's own specific clinical presentation and past medical history.  All of their questions been addressed.  We plan to proceed with cardiac dictated CT angiogram of the heart to further investigate the  size of the patient's aortic root and the presence of surrounding calcification which might increased risks associated with conventional surgical aortic valve replacement. If it appears that conventional surgery might be associated with relatively high risk it may be reasonable to consider transcatheter aortic valve replacement as an alternative. The patient will return for follow-up to review the results of her CT scan and discuss treatment options further in 2 weeks.    I spent in excess of 90 minutes during the conduct of this office  consultation and >50% of this time involved direct face-to-face encounter with the patient for counseling and/or coordination of their care.   Valentina Gu. Roxy Manns, MD 12/14/2013 1:49 PM

## 2013-12-15 ENCOUNTER — Other Ambulatory Visit: Payer: Self-pay | Admitting: *Deleted

## 2013-12-15 DIAGNOSIS — I350 Nonrheumatic aortic (valve) stenosis: Secondary | ICD-10-CM

## 2013-12-17 ENCOUNTER — Ambulatory Visit: Payer: Medicare Other | Attending: Thoracic Surgery (Cardiothoracic Vascular Surgery) | Admitting: Physical Therapy

## 2013-12-17 DIAGNOSIS — M6281 Muscle weakness (generalized): Secondary | ICD-10-CM | POA: Insufficient documentation

## 2013-12-17 DIAGNOSIS — I352 Nonrheumatic aortic (valve) stenosis with insufficiency: Secondary | ICD-10-CM | POA: Insufficient documentation

## 2013-12-17 DIAGNOSIS — R5383 Other fatigue: Secondary | ICD-10-CM | POA: Diagnosis not present

## 2013-12-23 ENCOUNTER — Ambulatory Visit (HOSPITAL_COMMUNITY)
Admission: RE | Admit: 2013-12-23 | Discharge: 2013-12-23 | Disposition: A | Discharge status: Home / Self Care | Payer: Medicare Other | Source: Ambulatory Visit | Attending: Thoracic Surgery (Cardiothoracic Vascular Surgery) | Admitting: Thoracic Surgery (Cardiothoracic Vascular Surgery)

## 2013-12-23 ENCOUNTER — Ambulatory Visit (HOSPITAL_COMMUNITY): Admission: RE | Admit: 2013-12-23 | Payer: Medicare Other | Source: Ambulatory Visit

## 2013-12-23 DIAGNOSIS — I350 Nonrheumatic aortic (valve) stenosis: Secondary | ICD-10-CM

## 2013-12-23 LAB — PULMONARY FUNCTION TEST
DL/VA % pred: 89 %
DL/VA: 4.05 ml/min/mmHg/L
DLCO unc % pred: 55 %
DLCO unc: 11.92 ml/min/mmHg
FEF 25-75 POST: 1.44 L/s
FEF 25-75 PRE: 0.69 L/s
FEF2575-%CHANGE-POST: 109 %
FEF2575-%PRED-POST: 153 %
FEF2575-%PRED-PRE: 73 %
FEV1-%CHANGE-POST: 15 %
FEV1-%PRED-PRE: 85 %
FEV1-%Pred-Post: 99 %
FEV1-PRE: 1.3 L
FEV1-Post: 1.51 L
FEV1FVC-%Change-Post: 5 %
FEV1FVC-%PRED-PRE: 96 %
FEV6-%Change-Post: 10 %
FEV6-%PRED-POST: 105 %
FEV6-%Pred-Pre: 95 %
FEV6-Post: 2.04 L
FEV6-Pre: 1.85 L
FEV6FVC-%Change-Post: 0 %
FEV6FVC-%Pred-Post: 106 %
FEV6FVC-%Pred-Pre: 106 %
FVC-%Change-Post: 10 %
FVC-%Pred-Post: 99 %
FVC-%Pred-Pre: 90 %
FVC-Post: 2.06 L
FVC-Pre: 1.87 L
Post FEV1/FVC ratio: 73 %
Post FEV6/FVC ratio: 99 %
Pre FEV1/FVC ratio: 69 %
Pre FEV6/FVC Ratio: 99 %
RV % pred: 88 %
RV: 2.14 L
TLC % pred: 86 %
TLC: 4.13 L

## 2013-12-23 MED ORDER — ALBUTEROL SULFATE (2.5 MG/3ML) 0.083% IN NEBU
2.5000 mg | INHALATION_SOLUTION | Freq: Once | RESPIRATORY_TRACT | Status: AC
  Administered 2013-12-23: 2.5 mg via RESPIRATORY_TRACT

## 2013-12-23 MED ORDER — IOHEXOL 350 MG/ML SOLN
80.0000 mL | Freq: Once | INTRAVENOUS | Status: AC | PRN
  Administered 2013-12-23: 80 mL via INTRAVENOUS

## 2013-12-28 ENCOUNTER — Ambulatory Visit (INDEPENDENT_AMBULATORY_CARE_PROVIDER_SITE_OTHER): Payer: Medicare Other | Admitting: Thoracic Surgery (Cardiothoracic Vascular Surgery)

## 2013-12-28 ENCOUNTER — Encounter: Payer: Self-pay | Admitting: Thoracic Surgery (Cardiothoracic Vascular Surgery)

## 2013-12-28 ENCOUNTER — Other Ambulatory Visit: Payer: Self-pay | Admitting: *Deleted

## 2013-12-28 ENCOUNTER — Ambulatory Visit: Payer: Medicare Other | Admitting: Thoracic Surgery (Cardiothoracic Vascular Surgery)

## 2013-12-28 VITALS — BP 143/74 | HR 81 | Resp 16 | Ht 62.0 in | Wt 144.0 lb

## 2013-12-28 DIAGNOSIS — I350 Nonrheumatic aortic (valve) stenosis: Secondary | ICD-10-CM

## 2013-12-28 NOTE — Progress Notes (Addendum)
[address]       [place], [address]             [phone]     CARDIOTHORACIC SURGERY OFFICE NOTE  Referring Provider is Croitoru, Dani Gobble, MD PCP is Ky Barban, MD   HPI:  The patient returns to the office today for follow-up of stage D severe symptomatic aortic stenosis. She was originally seen in consultation on 12/14/2013.  She presents with progressive symptoms of exertional shortness of breath and fatigue with the recent development of intermittent paroxysmal nocturnal dyspnea and orthopnea consistent with chronic diastolic congestive heart failure, New York Heart Association functional class 2-3.  She has normal left ventricular systolic function and cardiac catheterization is notable for the absence of significant coronary artery disease.  Although risks associated with conventional surgical aortic valve replacement would only be predicted to be moderately elevated using traditional risk model such as the STS risk calculator, I feel that risks of surgery would be considerably higher because of the patient's advanced age, limited mobility with severe degenerative arthritis in both knees, and most importantly because the patient has relatively small sized aortic root which would likely require root replacement at the time of surgery. Under the circumstances we discussed transcatheter aortic valve replacement as an alternative. Since then the patient underwent CT angiography, pulmonary function testing, and formal physical therapy consultation. She returns to the office today to discuss the results of these findings and make definitive plans for surgical intervention. She reports no new problems or complaints over the past 2 weeks.   Current Outpatient Prescriptions  Medication Sig Dispense Refill  . amLODipine (NORVASC) 10 MG tablet Take 10 mg by mouth daily.    . carvedilol (COREG) 12.5 MG tablet Take 12.5 mg by mouth 2 (two) times daily.     .  cholecalciferol (VITAMIN D) 1000 UNITS tablet Take 1,000 Units by mouth daily.    . fenofibrate micronized (LOFIBRA) 200 MG capsule Take 200 mg by mouth daily before breakfast.    . fish oil-omega-3 fatty acids 1000 MG capsule Take 1 g by mouth daily. With 300 mg of Omega    . fluticasone (VERAMYST) 27.5 MCG/SPRAY nasal spray Place 1 spray into both nostrils 2 (two) times daily as needed for allergies.     . hydrochlorothiazide (HYDRODIURIL) 25 MG tablet Take 25 mg by mouth daily.    Marland Kitchen omeprazole (PRILOSEC) 40 MG capsule Take 40 mg by mouth daily.     No current facility-administered medications for this visit.      Physical Exam:   BP 143/74 mmHg  Pulse 81  Resp 16  Ht 5\' 2"  (1.575 m)  Wt 144 lb (65.318 kg)  BMI 26.33 kg/m2  SpO2 96%  General:  Well-appearing  Chest:   Clear  CV:   Regular rate and rhythm with late peaking systolic murmur  Incisions:  n/a  Abdomen:  Soft  Extremities:  Warm  Diagnostic Tests:  Cardiac TAVR CT  TECHNIQUE: The patient was scanned on a Philips 256 scanner. A 120 kV retrospective scan was triggered in the descending thoracic aorta at 111 HU's. Gantry rotation speed was 270 msecs and collimation was .9 mm. No beta blockade or nitro were given. The 3D data set was reconstructed in 5% intervals of the R-R cycle. Systolic and diastolic phases were analyzed on a dedicated work station using MPR, MIP and VRT modes. The patient received 80 cc of contrast.  FINDINGS: Aortic  Valve: Trileaflet with severe calcification of leaflet tips  Aorta: STJ a bit diminutive but little calcium. Mild calcification of inferior surface of Arch. Normal origin of great vessels. Mild mixed plaque in the descending thoracic aorta  Sinotubular Junction: 2.2 cm  Ascending Thoracic Aorta: 2.9 cm  Aortic Arch: 2.3 cm  Descending Thoracic Aorta: 2.2 cm  Sinus of Valsalva Measurements:  Non-coronary: 2.6 cm  Right -coronary: 2.5 cm  Left  -coronary: 2.7 cm  Coronary Artery Height above Annulus:  Left Main: 14.6 cm  Right Coronary: 15.7 cm  Virtual Basal Annulus Measurements:  Maximum/Minimum Diameter: 2.3 cm x 1.9 cm  Perimeter: 69 mm  Area: 366 mm2  Coronary Arteries: Right dominant. Calcified including LM ostium High anterior take off to RCA. No obstructive disease see cath report  Optimum Fluoroscopic Angle for Delivery: RAO 8 degrees Cranial 1 degrees  IMPRESSION: 1) Calcified trileaflet Aortic valve  2) Somewhat diminutive Aortic root with STJ 2.2 cm but no severe calcification of STJ or annulus  3) Annulus Area 366 mm2 suitable for 23 mm Sapien XT valve  4) No adverse aortic root pathology for transfemoral delivery  5) Non obstructive CAD with calcification of LM ostium and high anterior take off or RCA origin Coronary heights sufficient for TAVR  6) Optimum angiographic angle for delivery RAO 8 degrees Cranial 1 degree  Jenkins Rouge   Electronically Signed  By: Jenkins Rouge M.D.  On: 12/23/2013 11:46      Study Result     EXAM: OVER-READ INTERPRETATION CT CHEST  The following report is an over-read performed by radiologist Dr. Fonnie Birkenhead [facility] on 12/23/2013. This over-read does not include interpretation of cardiac or coronary anatomy or pathology. The coronary calcium score/coronary CTA interpretation by the cardiologist is attached.  COMPARISON: None.  FINDINGS: No pathologically enlarged mediastinal, hilar or axillary lymph nodes. Lower esophageal wall may be minimally thickened, raising suspicion for gastroesophageal reflux disease. Mild scattered pulmonary parenchymal scarring. No pleural fluid. Airway is unremarkable. Incidental imaging of the upper abdomen shows no acute findings. No worrisome lytic or sclerotic lesions. Degenerative changes are seen in the spine.  IMPRESSION: No acute extracardiac  findings.  Electronically Signed: By: Lorin Picket M.D. On: 12/23/2013 10:51     CTA ABDOMEN AND PELVIS WITH CONTRAST  TECHNIQUE: Multidetector CT imaging of the abdomen and pelvis was performed using the standard protocol during bolus administration of intravenous contrast. Multiplanar reconstructed images and MIPs were obtained and reviewed to evaluate the vascular anatomy.  CONTRAST: 87mL OMNIPAQUE IOHEXOL 350 MG/ML SOLN  COMPARISON: None.  FINDINGS: CT ABDOMEN AND PELVIS FINDINGS  Lower chest: Severe calcifications of the mitral annulus.  Hepatobiliary: 3.2 x 2.2 cm low-attenuation lesion in the inferior aspect of segment 5 of the liver is compatible with a simple cyst. No other suspicious appearing hepatic lesions are noted. No intra or extrahepatic biliary ductal dilatation. Status post cholecystectomy.  Pancreas: Unremarkable.  Spleen: Unremarkable.  Adrenals/Urinary Tract: Mild bilateral adreniform thickening of the adrenal glands may suggest mild adrenal hyperplasia. Mild multifocal cortical thinning in the kidneys bilaterally, suggesting areas of post infectious or inflammatory scarring. No suspicious renal lesions. No hydroureteronephrosis. Urinary bladder is normal in appearance.  Stomach/Bowel: The enhanced appearance of the stomach is normal. No pathologic dilatation of small bowel or colon. Several colonic diverticulae are noted, without surrounding inflammatory changes to suggest an acute diverticulitis at this time.  Vascular/Lymphatic: Vascular findings and measurements pertinent to potential TAVR procedure, as detailed below. No  pathologically enlarged lymph nodes are noted in the abdomen or pelvis.  Reproductive: Uterus and ovaries are atrophic.  Other: No significant volume of ascites. No pneumoperitoneum.  Musculoskeletal: There are no aggressive appearing lytic or blastic lesions noted in the visualized portions of  the skeleton.  VASCULAR MEASUREMENTS PERTINENT TO TAVR:  AORTA:  Minimal Aortic Diameter - 11 x 8 mm  Severity of Aortic Calcification - severe  RIGHT PELVIS:  Right Common Iliac Artery -  Minimal Diameter - 8.4 x 7.3 mm  Tortuosity - mild  Calcification - moderate  Right External Iliac Artery -  Minimal Diameter - 7.5 x 7.2 mm  Tortuosity - moderate  Calcification - minimal  Right Common Femoral Artery -  Minimal Diameter - 6.8 x 7.0 mm  Tortuosity - mild  Calcification - mild  LEFT PELVIS:  Left Common Iliac Artery -  Minimal Diameter - 7.2 x 7.2 mm  Tortuosity - mild  Calcification - moderate  Left External Iliac Artery -  Minimal Diameter - 6.6 x 6.4 mm  Tortuosity - mild  Calcification - none  Left Common Femoral Artery -  Minimal Diameter - 6.8 x 6.8 mm  Tortuosity - mild  Calcification - minimal  Review of the MIP images confirms the above findings.  IMPRESSION: 1. Vascular findings and measurements pertinent to potential TAVR procedure, as detailed above. This patient does appear to have suitable pelvic arterial access. 2. Incidental findings, as above.   Electronically Signed  By: Vinnie Langton M.D.  On: 12/23/2013 17:21   Pulmonary Function Tests  Baseline      Post-bronchodilator  FVC  1.87 L  (90% predicted) FVC  2.06 L  (99% predicted) FEV1  1.30 L  (85% predicted) FEV1  1.51 L  (99% predicted) FEF25-75 0.69 L  (73% predicted) FEF25-75 1.44 L  (153% predicted)  TLC  4.13 L  (86% predicted) RV  2.14 L  (88% predicted) DLCO  55% predicted     Impression:  Patient has stage D severe symptomatic aortic stenosis. She describes progressive symptoms of exertional shortness of breath and fatigue with the recent development of intermittent paroxysmal nocturnal dyspnea and orthopnea consistent with chronic diastolic congestive heart failure, New York Heart Association functional  class 2-3. I have personally reviewed the patient's recent transthoracic echocardiogram and diagnostic cardiac catheterization. Her aortic valve appears tricuspid with severe calcification and thickening involving all 3 leaflets. There is severe leaflet restriction and severe aortic stenosis with peak velocity across the aortic valve in excess of 4.5 m/sec. Her aortic root is relatively small in size. Left ventricular systolic function remains normal and cardiac catheterization is notable for the absence of significant coronary artery disease. She has significant diastolic dysfunction and there was mild pulmonary hypertension noted at cath. I believe that risks associated with conventional surgery would be considerably higher than that predicted using traditional risk models because the patient appears to have a relatively small aortic root which might require aortic root enlargement or root replacement to avoid the development of patient-prosthesis mismatch. Furthermore, despite the fact that the patient remains functionally independent, her mobility has been limited recently because of progressive severe degenerative arthritis involving both knees.   Because of this I feel that risks associated with conventional surgery would be high with anticipated risk of mortality approaching 15% and risk of significant morbidity in excess of 50%.  After thorough review of cardiac gated CT angiogram of the heart and CT angiogram of the abdomen and pelvis, the patient  appears to be a good candidate for transcatheter aortic valve replacement using a 23 mm Edwards Sapien XT or Sapien 3 transcatheter heart valve placed using transfemoral approach.    Plan:  The patient was again counseled at length regarding treatment alternatives for management of severe symptomatic aortic stenosis. Alternative approaches such as conventional aortic valve replacement, transcatheter aortic valve replacement, and palliative medical  therapy were compared and contrasted at length.  The risks associated with conventional surgical aortic valve replacement were been discussed in detail, as were expectations for post-operative convalescence. Long-term prognosis with medical therapy was discussed. This discussion was placed in the context of the patient's own specific clinical presentation and past medical history.  The patient hopes to proceed with transcatheter aortic valve replacement in the near future.  Following the decision to proceed with transcatheter aortic valve replacement, a discussion has been held regarding what types of management strategies would be attempted intraoperatively in the event of life-threatening complications, including whether or not the patient would be considered a candidate for the use of cardiopulmonary bypass and/or conversion to open sternotomy for attempted surgical intervention.  The patient would desire to go through with conventional sternotomy and/or use of cardiopulmonary bypass if necessary as a life saving measure.  The patient has been advised of a variety of complications that might develop including but not limited to risks of death, stroke, paravalvular leak, aortic dissection or other major vascular complications, aortic annulus rupture, device embolization, cardiac rupture or perforation, mitral regurgitation, acute myocardial infarction, arrhythmia, heart block or bradycardia requiring permanent pacemaker placement, congestive heart failure, respiratory failure, renal failure, pneumonia, infection, other late complications related to structural valve deterioration or migration, or other complications that might ultimately cause a temporary or permanent loss of functional independence or other long term morbidity.  The patient provides full informed consent for the procedure as described and all questions were answered.  We plan to proceed with surgery on Tuesday, 01/19/2014. The patient will be  referred for consultation with Dr. Burt Knack or Dr. Angelena Form from interventional cardiology.  In addition, she will be evaluated by Dr. Cyndia Bent for second opinion regarding surgical options.    I spent in excess of 15 minutes during the conduct of this office consultation and >50% of this time involved direct face-to-face encounter with the patient for counseling and/or coordination of their care.    Valentina Gu. Roxy Manns, MD 12/28/2013 12:04 PM

## 2014-01-01 ENCOUNTER — Encounter: Payer: Self-pay | Admitting: Cardiovascular Disease

## 2014-01-01 ENCOUNTER — Ambulatory Visit (INDEPENDENT_AMBULATORY_CARE_PROVIDER_SITE_OTHER): Payer: Medicare Other | Admitting: Cardiovascular Disease

## 2014-01-01 VITALS — BP 160/78 | HR 87 | Ht 62.0 in | Wt 146.1 lb

## 2014-01-01 DIAGNOSIS — I350 Nonrheumatic aortic (valve) stenosis: Secondary | ICD-10-CM

## 2014-01-01 NOTE — Progress Notes (Signed)
HPI:  80 year-old woman presenting for evaluation of severe symptomatic aortic stenosis. She has a longstanding heart murmur. She recently presented for preoperative evaluation for elective left total knee replacement and was noted to have a loud murmur. She was referred to cardiology and an echo showed severe aortic stenosis. The patient was subsequently referred to Dr Roxy Manns for surgical evaluation and she is now being considered for TAVR as an alternative to high-risk conventional aortic valve replacement in the setting of advanced age, severe arthritis, and a small aortic root which would like require root enlargement.   The patient reports progressive fatigue and exertional dyspnea over the past 6 months. She is currently quite limited by exertional dyspnea and she describes NYHA Functional Class III symptoms. She denies resting shortness of breath, chest pain at rest or with exertion, or lightheadedness. She has no past history of ischemic heart disease, angina, or MI. She's has no major surgeries, other than a cholecystectomy about 10 years ago.   She is also limited by osteoarthritis with chronic left knee pain and difficulty with ambulation. Also complains of right knee pain. She fell about 6 years ago and dislocated her shoulder. She didn't require surgery but suffers from chronic should pain as a result of this injury. Otherwise she has been in reasonably good health.   The patient has been widowed for 25 years. She worked at [employer] until she was 80 years old. She has 3 children who live locally and 4 grandchildren.   Outpatient Encounter Prescriptions as of 01/01/2014  Medication Sig  . amLODipine (NORVASC) 10 MG tablet Take 10 mg by mouth daily.  . carvedilol (COREG) 12.5 MG tablet Take 12.5 mg by mouth 2 (two) times daily.   . cholecalciferol (VITAMIN D) 1000 UNITS tablet Take 1,000 Units by mouth daily.  . fenofibrate micronized (LOFIBRA) 200 MG capsule Take 200 mg by  mouth daily before breakfast.  . fish oil-omega-3 fatty acids 1000 MG capsule Take 1 g by mouth daily. With 300 mg of Omega  . fluticasone (VERAMYST) 27.5 MCG/SPRAY nasal spray Place 1 spray into both nostrils 2 (two) times daily as needed for allergies.   . hydrochlorothiazide (HYDRODIURIL) 25 MG tablet Take 25 mg by mouth daily.  Marland Kitchen omeprazole (PRILOSEC) 40 MG capsule Take 40 mg by mouth daily.    Aspirin and Codeine  Past Medical History  Diagnosis Date  . Hypertension   . Hyperlipidemia   . GERD (gastroesophageal reflux disease) 05/14/2011  . Arthritis     Both knees R>L  . Chronic diastolic congestive heart failure, NYHA class 2   . Severe aortic stenosis 12/06/2013    Past Surgical History  Procedure Laterality Date  . Cholecystectomy  2008  . Tonsillectomy      70 years ago    History   Social History  . Marital Status: Widowed    Spouse Name: N/A    Number of Children: 3  . Years of Education: N/A   Occupational History  . Not on file.   Social History Main Topics  . Smoking status: Never Smoker   . Smokeless tobacco: Not on file  . Alcohol Use: No  . Drug Use: No  . Sexual Activity: Not on file   Other Topics Concern  . Not on file   Social History Narrative   Widowed x 25 yrs,  Retired from [employer], lives alone - functionally independent    Family History  Problem Relation Age  of Onset  . Breast cancer Sister     2 sisters  . Colon cancer Neg Hx   . Muscular dystrophy Father     Died at 68    ROS:   General: no fevers/chills/night sweats, positive for fatigue Eyes: no blurry vision, diplopia, or amaurosis ENT: no sore throat or hearing loss Resp: no cough, wheezing, or hemoptysis CV: no edema or palpitations, otherwise see HPI GI: no abdominal pain, nausea, vomiting, diarrhea, or constipation GU: no dysuria, frequency, or hematuria Skin: no rash Neuro: no headache, numbness, tingling, or weakness of extremities Musculoskeletal:  see HPI Heme: no bleeding, DVT, or easy bruising Endo: no polydipsia or polyuria  BP 160/78 mmHg  Pulse 87  Ht 5\' 2"  (1.575 m)  Wt 146 lb 1.9 oz (66.28 kg)  BMI 26.72 kg/m2  PHYSICAL EXAM: Pt is alert and oriented, WD, WN, pleasant elderly woman in no distress. HEENT: normal Neck: JVP normal. Carotid upstrokes delayed with bilateral bruits. No thyromegaly. Lungs: equal expansion, clear bilaterally CV: Apex is discrete and nondisplaced, RRR with grade 3/6 harsh systolic murmur at the RUSB, late peaking with absent A2 Abd: soft, NT, +BS, no bruit, no hepatosplenomegaly Back: no CVA tenderness Ext: no C/C/E        Femoral pulses 1+=        DP/PT pulses intact and = Skin: warm and dry without rash Neuro: CNII-XII intact             Strength intact = bilaterally  2D Echo: Study Conclusions  - Left ventricle: The cavity size was normal. Wall thickness was increased in a pattern of severe LVH. Systolic function was vigorous. The estimated ejection fraction was in the range of 65% to 70%. Doppler parameters are consistent with abnormal left ventricular relaxation (grade 1 diastolic dysfunction). - Aortic valve: AV is thckened, calcified with restricted motion. Peak and mean gradients through the valve are 90 and 50 mm Hg respectively consistent with critical AS. - Left atrium: The atrium was mildly dilated. - Pulmonary arteries: PA peak pressure: 68 mm Hg (S).  CTA Chest/Abdomen/Pelvis: VASCULAR MEASUREMENTS PERTINENT TO TAVR:  AORTA:  Minimal Aortic Diameter - 11 x 8 mm  Severity of Aortic Calcification - severe  RIGHT PELVIS:  Right Common Iliac Artery -  Minimal Diameter - 8.4 x 7.3 mm  Tortuosity - mild  Calcification - moderate  Right External Iliac Artery -  Minimal Diameter - 7.5 x 7.2 mm  Tortuosity - moderate  Calcification - minimal  Right Common Femoral Artery -  Minimal Diameter - 6.8 x 7.0 mm  Tortuosity -  mild  Calcification - mild  LEFT PELVIS:  Left Common Iliac Artery -  Minimal Diameter - 7.2 x 7.2 mm  Tortuosity - mild  Calcification - moderate  Left External Iliac Artery -  Minimal Diameter - 6.6 x 6.4 mm  Tortuosity - mild  Calcification - none  Left Common Femoral Artery -  Minimal Diameter - 6.8 x 6.8 mm  Tortuosity - mild  Calcification - minimal  Review of the MIP images confirms the above findings.  IMPRESSION: 1. Vascular findings and measurements pertinent to potential TAVR procedure, as detailed above. This patient does appear to have suitable pelvic arterial access.  Gated Cardiac CTA: FINDINGS: Aortic Valve: Trileaflet with severe calcification of leaflet tips  Aorta: STJ a bit diminutive but little calcium. Mild calcification of inferior surface of Arch. Normal origin of great vessels. Mild mixed plaque in the descending thoracic aorta  Sinotubular Junction: 2.2 cm  Ascending Thoracic Aorta: 2.9 cm  Aortic Arch: 2.3 cm  Descending Thoracic Aorta: 2.2 cm  Sinus of Valsalva Measurements:  Non-coronary: 2.6 cm  Right -coronary: 2.5 cm  Left -coronary: 2.7 cm  Coronary Artery Height above Annulus:  Left Main: 14.6 cm  Right Coronary: 15.7 cm  Virtual Basal Annulus Measurements:  Maximum/Minimum Diameter: 2.3 cm x 1.9 cm  Perimeter: 69 mm  Area: 366 mm2  Coronary Arteries: Right dominant. Calcified including LM ostium High anterior take off to RCA. No obstructive disease see cath report  Optimum Fluoroscopic Angle for Delivery: RAO 8 degrees Cranial 1 degrees  IMPRESSION: 1) Calcified trileaflet Aortic valve  2) Somewhat diminutive Aortic root with STJ 2.2 cm but no severe calcification of STJ or annulus  3) Annulus Area 366 mm2 suitable for 23 mm Sapien XT valve  4) No adverse aortic root pathology for transfemoral delivery  5) Non obstructive CAD with  calcification of LM ostium and high anterior take off or RCA origin Coronary heights sufficient for TAVR  6) Optimum angiographic angle for delivery RAO 8 degrees Cranial 1 Degree  Cardiac Cath: Hemodynamic findings:  Aortic pressure 140/60 (mean 90) mm Hg   Left ventricle [id number] with end-diastolic pressure of 20 mm Hg  PA wedge pressure a wave 18, v wave 13 (mean 10) mm Hg  Pulmonary artery 45/20 (mean 31) mm Hg  Right ventricle 48/4 with an end-diastolic pressure of 10 mm Hg  Right atrium a wave 13, v wave 10 (mean 8) mm Hg  Cardiac output is 5.7 L per minute (cardiac index 3.4 L per minute per meter sq) Ao sat 93%, PA sat70% SVR 970 dsc(1610 dsci) PVR 436 (723 dsci)  Aortic valve mean gradient 37 mm Hg, AVA 0.77 cm sq   Angiographic Findings:  1. The left main coronary artery exhibits mild atherosclerosis and trifurcates into the left anterior descending artery, a large ramus intermedius artery and left circumflex coronary artery. There is a <30% stenosis in the distal left main.  2. The left anterior descending artery is a large vessel that reaches the apex and generates a single major diagonal branch. There is evidence of mild luminal irregularities and minimal calcification. No hemodynamically meaningful stenoses are seen. 3. The left circumflex coronary artery is a medium-size vessel non dominant vessel that generates two major oblique marginal arteries. There is evidence of mild luminal irregularities and no calcification. No hemodynamically meaningful stenoses are seen. The ramus intermedius artery is large and bifurcates, supplying most of the lateral wall. It is free of major stenoses. 4. The right coronary artery is a large-size dominant vessel that has a slightly anomalous high and and anterior sgenerates a long posterior lateral ventricular system as well as the PDA. There is evidence of moderate luminal irregularities and mild calcification. There is a 30% proximal  lesion, but no hemodynamically meaningful stenoses are seen.  5. The left ventricle is normal in size and systolic function by echo. The ascending aorta appears normal. There is severe aortic valve stenosis by pullback. The left ventricular end-diastolic pressure is elevated at 20 mm Hg.    IMPRESSIONS:  Severe aortic stenosis. Mild coronary atherosclerosis, but without significant stenoses. Elevated LVEDP, mild pulmonary artery HTN RECOMMENDATION:  Aortic valve replacement with a biological prosthesis.   ASSESSMENT AND PLAN: This is ann 80 year-old woman with severe symptomatic aortic stenosis, Stage D. She is significantly limited by exertional dyspnea with NYHA Functional Class III symptoms. I  suspect aortic stenosis is the primary cause of her symptoms.   I have reviewed her records, echo study, CT scans of the heart and peripheral vasculature, and cardiac cath films. We discussed the natural history and poor prognosis associated with severe symptomatic aortic stenosis. We reviewed treatment options in detail, including conventional surgery, TAVR, and palliative medical therapy. I agree with Dr Roxy Manns that TAVR is a reasonable treatment alternative for this elderly but functional woman with signficant comorbid medical conditions who would likely require root enlargement to because of her small aortic annulus.   Following the decision to proceed with transcatheter aortic valve replacement, a discussion has been held regarding what types of management strategies would be attempted intraoperatively in the event of life-threatening complications, including whether or not the patient would be considered a candidate for the use of cardiopulmonary bypass and/or conversion to open sternotomy for attempted surgical intervention.  The patient has been advised of a variety of complications that might develop including but not limited to risks of death, stroke, paravalvular leak, aortic dissection or  other major vascular complications, aortic annulus rupture, device embolization, cardiac rupture or perforation, mitral regurgitation, acute myocardial infarction, arrhythmia, heart block or bradycardia requiring permanent pacemaker placement, congestive heart failure, respiratory failure, renal failure, pneumonia, infection, other late complications related to structural valve deterioration or migration, or other complications that might ultimately cause a temporary or permanent loss of functional independence or other long term morbidity.  The patient provides full informed consent for the procedure as described and all questions were answered.  She is concerned about her disposition after TAVR and does not want to be a burden to her family. I advised her that we would have a post-operative PT consult to help determine her disposition and whether she would be able to return directly with family support for a short period versus ST-SNF for rehab. She requests [place] in case she requires ST-SNF. She is tentatively scheduled for TAVR Tuesday January 19, 2014.   Sherren Mocha, MD 01/01/2014 11:17 PM

## 2014-01-04 ENCOUNTER — Ambulatory Visit: Payer: Medicare Other | Admitting: Thoracic Surgery (Cardiothoracic Vascular Surgery)

## 2014-01-13 ENCOUNTER — Encounter: Payer: Self-pay | Admitting: Surgery

## 2014-01-13 ENCOUNTER — Institutional Professional Consult (permissible substitution) (INDEPENDENT_AMBULATORY_CARE_PROVIDER_SITE_OTHER): Payer: Medicare Other | Admitting: Surgery

## 2014-01-13 VITALS — BP 150/74 | HR 72 | Resp 20 | Ht 62.0 in | Wt 146.0 lb

## 2014-01-13 DIAGNOSIS — I350 Nonrheumatic aortic (valve) stenosis: Secondary | ICD-10-CM

## 2014-01-13 NOTE — Progress Notes (Signed)
Patient ID: [name], female   DOB: March 26, 1925, 80 y.o.   MRN: [id number]   [facility] CONSULTATION REPORT  Referring Provider is Croitoru, Quillian Quince, MD PCP is Ky Barban, MD  Chief Complaint  Patient presents with  . Aortic Stenosis    2nd TAVR surgical eval    HPI:  The patient is an 80 year old widowed white female with long-standing history of heart murmur, hypertension, and hyperlipidemia who has been referred for possible surgical intervention for management of severe symptomatic aortic stenosis. She has developed progressive degenerative arthritis involving both knees and recently she was evaluated for right total knee replacement by Dr. Percell Miller. An echocardiogram was obtained to evaluate the patient's heart murmur and found to demonstrate critical aortic stenosis. Peak velocity across the aortic valve measured in excess of 4.5 m/s corresponding to peak and mean transvalvular gradients estimated to be 81 and 47 mmHg, respectively. Left ventricular systolic function was normal with an ejection fraction of 65%. The patient was referred to Dr. Sallyanne Kuster who performed left and right heart catheterization 12/08/2013. The patient was found to have severe aortic stenosis with a mean transvalvular gradient measured at catheterization of 37 mmHg, corresponding to a calculated aortic valve area 0.77 cm. The patient had nonobstructive coronary artery disease and mild pulmonary hypertension. She recently underwent a gated cardiac CT and CTA of the chest, abdomen and pelvis for workup for TAVR.  Although she is 80 years old she remains functionally independent. She drives an automobile and tends to all of her basic daily needs. She admits to a 6 month history of progressive exertional shortness of breath and fatigue. She feels like her breathing has worsened even since the diagnosis of severe AS has been made.  She is significantly limited by her degenerative arthritis and is looking forward to having her right knee replaced which causes her a lot of pain with ambulation.   Past Medical History  Diagnosis Date  . Hypertension   . Hyperlipidemia   . GERD (gastroesophageal reflux disease) 05/14/2011  . Arthritis     Both knees R>L  . Chronic diastolic congestive heart failure, NYHA class 2   . Severe aortic stenosis 12/06/2013    Past Surgical History  Procedure Laterality Date  . Cholecystectomy  2008  . Tonsillectomy      70 years ago    Family History  Problem Relation Age of Onset  . Breast cancer Sister     2 sisters  . Colon cancer Neg Hx   . Muscular dystrophy Father     Died at 30    History   Social History  . Marital Status: Widowed    Spouse Name: N/A    Number of Children: 3  . Years of Education: N/A   Occupational History  . Not on file.   Social History Main Topics  . Smoking status: Never Smoker   . Smokeless tobacco: Not on file  . Alcohol Use: No  . Drug Use: No  . Sexual Activity: Not on file   Other Topics Concern  . Not on file   Social History Narrative   Widowed x 25 yrs,  Retired from [employer], lives alone - functionally independent    Current Outpatient Prescriptions  Medication Sig Dispense Refill  . amLODipine (NORVASC) 10 MG tablet Take 10 mg by mouth daily.    . carvedilol (COREG) 12.5 MG tablet Take 12.5  mg by mouth 2 (two) times daily.     . cholecalciferol (VITAMIN D) 1000 UNITS tablet Take 1,000 Units by mouth 2 (two) times daily.     . fenofibrate micronized (LOFIBRA) 200 MG capsule Take 200 mg by mouth daily.     . fish oil-omega-3 fatty acids 1000 MG capsule Take 1 g by mouth daily. With 300 mg of Omega 3    . fluticasone (FLONASE) 50 MCG/ACT nasal spray Place 1 spray into both nostrils 2 (two) times daily as needed (congestion).    . hydrochlorothiazide (HYDRODIURIL) 25 MG tablet Take 25 mg by mouth daily.    .  naproxen sodium (ALEVE) 220 MG tablet Take 220 mg by mouth 2 (two) times daily as needed (pain/headache).    Marland Kitchen omeprazole (PRILOSEC) 40 MG capsule Take 40 mg by mouth daily.     No current facility-administered medications for this visit.    Allergies  Allergen Reactions  . Aspirin Nausea And Vomiting  . Codeine Nausea And Vomiting      Review of Systems:  General:normal appetite, decreased energy, no weight gain, no weight loss, no fever Cardiac:no chest pain with exertion, no chest pain at rest, + SOB with exertion, no resting SOB, + PND, + orthopnea, + palpitations, no arrhythmia, no atrial fibrillation, no LE edema, no dizzy spells, no syncope Respiratory:+ exertional shortness of breath, no home oxygen, no productive cough, no dry cough, no bronchitis, no wheezing, no hemoptysis, no asthma, no pain with inspiration or cough, + sleep apnea, no CPAP at night GI:no difficulty swallowing, + reflux, + frequent heartburn, no hiatal hernia, no abdominal pain, no constipation, no diarrhea, no hematochezia, no hematemesis, no melena GU:no dysuria, + frequency, + recent urinary tract infection, no hematuria, no kidney stones, no kidney disease Vascular:no pain suggestive of claudication, no pain in feet, no leg cramps, no varicose veins, no DVT, no non-healing foot ulcer Neuro:no stroke, no TIA's, no seizures, no headaches, no temporary blindness one eye, no slurred speech, no peripheral neuropathy, + chronic pain, no instability of gait, no memory/cognitive dysfunction Musculoskeletal:+ arthritis, + joint swelling in both knees with right worse than left, no myalgias, + difficulty walking, somewhat decreased mobility   Skin:no rash, no itching, no skin infections, no pressure sores or ulcerations Psych:+ anxiety, no depression, + nervousness, + unusual recent stress Eyes:no blurry vision, no floaters, no recent vision changes, + wears glasses or contacts ENT:+ hearing loss, no loose or painful teeth, no dentures, last saw dentist within the past year Hematologic:no easy bruising, no abnormal bleeding, no clotting disorder, no frequent epistaxis Endocrine:no diabetes, does not check CBG's at home         Physical Exam:   BP 150/74 mmHg  Pulse 72  Resp 20  Ht 5\' 2"  (1.575 m)  Wt 146 lb (66.225 kg)  BMI 26.70 kg/m2  SpO2 97%  General:  Eldery, well-appearing woman in no distress  HEENT:  Unremarkable , NCAT, PERLA, EOMI, oropharynx clear. Teeth in good condition  Neck:   no JVD, no bruits, no adenopathy or thyromegaly  Chest:   clear to auscultation, symmetrical breath sounds, no wheezes, no rhonchi   CV:   RRR, grade III/VI crescendo/decrescendo murmur heard best at RSB,  no diastolic murmur  Abdomen:  soft, non-tender, no masses or organomegaly  Extremities:  warm, well-perfused, pulses palpable in both feet, no LE edema  Rectal/GU  Deferred  Neuro:   Grossly non-focal and symmetrical throughout  Skin:   Clean  and dry, no rashes, no breakdown   Diagnostic Tests:     *[facility], [place], [place] [address]              [phone]  ------------------------------------------------------------------- Transthoracic Echocardiography  Patient:  [name], [name] MR #:    [id number] Study Date: 11/23/2013 Gender:   F Age:    75 Height:   162.6 cm Weight:   65.8  kg BSA:    1.73 m^2 Pt. Status: Room:  ATTENDING  Lupita Leash REFERRING  Brett Canales SONOGRAPHER Marygrace Drought, RCS PERFORMING  [facility], Outpatient  cc:  ------------------------------------------------------------------- LV EF: 65% -  70%  ------------------------------------------------------------------- Indications:   785.2 Cardiac murmur.  ------------------------------------------------------------------- History:  PMH: Hyperlipidemia, Pre-OP. Risk factors: Hypertension.  ------------------------------------------------------------------- Study Conclusions  - Left ventricle: The cavity size was normal. Wall thickness was increased in a pattern of severe LVH. Systolic function was vigorous. The estimated ejection fraction was in the range of 65% to 70%. Doppler parameters are consistent with abnormal left ventricular relaxation (grade 1 diastolic dysfunction). - Aortic valve: AV is thckened, calcified with restricted motion. Peak and mean gradients through the valve are 90 and 50 mm Hg respectively consistent with critical AS. - Left atrium: The atrium was mildly dilated. - Pulmonary arteries: PA peak pressure: 68 mm Hg (S).  Transthoracic echocardiography. M-mode, complete 2D, spectral Doppler, and color Doppler. Birthdate: Patient birthdate: 09/18/1925. Age: Patient is 80 yr old. Sex: Gender: female. BMI: 24.9 kg/m^2. Blood pressure:   154/64 Patient status: Outpatient. Study date: Study date: 11/23/2013. Study time: 02:30 PM. Location: Echo laboratory.  -------------------------------------------------------------------  ------------------------------------------------------------------- Left ventricle: The cavity size was normal. Wall thickness was increased in a pattern of severe LVH. Systolic function was vigorous. The estimated ejection fraction was in the range of 65% to 70%.  Doppler parameters are consistent with abnormal left ventricular relaxation (grade 1 diastolic dysfunction).  ------------------------------------------------------------------- Aortic valve: AV is thckened, calcified with restricted motion. Peak and mean gradients through the valve are 90 and 50 mm Hg respectively consistent with critical AS. Doppler:   VTI ratio of LVOT to aortic valve: 0.35. Valve area (VTI): 1.11 cm^2. Indexed valve area (VTI): 0.64 cm^2/m^2. Valve area (Vmax): 1.04 cm^2. Indexed valve area (Vmax): 0.6 cm^2/m^2. Mean velocity ratio of LVOT to aortic valve: 0.39. Valve area (Vmean): 1.22 cm^2. Indexed valve area (Vmean): 0.7 cm^2/m^2.  Mean gradient (S): 47 mm Hg. Peak gradient (S): 81 mm Hg.  ------------------------------------------------------------------- Mitral valve:  Calcified annulus. Moderately thickened leaflets . Doppler: There was trivial regurgitation.  Valve area by pressure half-time: 2.5 cm^2. Indexed valve area by pressure half-time: 1.44 cm^2/m^2. Valve area by continuity equation (using LVOT flow): 2.69 cm^2. Indexed valve area by continuity equation (using LVOT flow): 1.55 cm^2/m^2.  Mean gradient (D): 4 mm Hg. Peak gradient (D): 4 mm Hg.  ------------------------------------------------------------------- Left atrium: LA Volume/ BSA = 40.9 ml/m2. The atrium was mildly dilated.  ------------------------------------------------------------------- Right ventricle: The cavity size was normal. Wall thickness was normal. Systolic function was normal.  ------------------------------------------------------------------- Pulmonic valve:  Poorly visualized. Doppler: There was no significant regurgitation.  ------------------------------------------------------------------- Tricuspid valve:  Mildly thickened leaflets. Leaflet separation was normal. Doppler: Transvalvular velocity was within the normal range. There was mild  regurgitation.  ------------------------------------------------------------------- Pulmonary artery:  Poorly visualized.  ------------------------------------------------------------------- Right atrium: The atrium was  normal in size.  ------------------------------------------------------------------- Pericardium: There was no pericardial effusion.  ------------------------------------------------------------------- Systemic veins: Inferior vena cava: The vessel was normal in size. The respirophasic diameter changes were in the normal range (= 50%), consistent with normal central venous pressure. Diameter: 12 mm.  ------------------------------------------------------------------- Measurements  IVC                    Value     Reference ID                    12  mm    ---------  Left ventricle              Value     Reference LV ID, ED, PLAX chordal      (L)   35.9 mm    43 - 52 LV ID, ES, PLAX chordal          26.4 mm    23 - 38 LV fx shortening, PLAX chordal  (L)   26  %    >=29 LV PW thickness, ED            12.5 mm    --------- IVS/LV PW ratio, ED        (H)   1.46      <=1.3 Stroke volume, 2D             114  ml    --------- Stroke volume/bsa, 2D           66  ml/m^2  --------- LV e&', lateral              6.91 cm/s   --------- LV E/e&', lateral             15.05     --------- LV e&', medial               5.59 cm/s   --------- LV E/e&', medial              18.6      --------- LV e&', average              6.25 cm/s   --------- LV E/e&', average             16.64     ---------  Ventricular septum            Value     Reference IVS thickness, ED             18.2  mm    ---------  LVOT                   Value     Reference LVOT ID, S                20  mm    --------- LVOT area                 3.14 cm^2   --------- LVOT peak velocity, S           149  cm/s   --------- LVOT mean velocity, S           122  cm/s   --------- LVOT VTI, S                36.3 cm    --------- LVOT peak gradient, S           9   mm Hg  ---------  Aortic valve  Value     Reference Aortic valve mean velocity, S       315  cm/s   --------- Aortic valve VTI, S            103  cm    --------- Aortic mean gradient, S          47  mm Hg  --------- Aortic peak gradient, S          81  mm Hg  --------- VTI ratio, LVOT/AV            0.35      --------- Aortic valve area, VTI          1.11 cm^2   --------- Aortic valve area/bsa, VTI        0.64 cm^2/m^2 --------- Aortic valve area, peak velocity     1.04 cm^2   --------- Aortic valve area/bsa, peak        0.6  cm^2/m^2 --------- velocity Velocity ratio, mean, LVOT/AV       0.39      --------- Aortic valve area, mean velocity     1.22 cm^2   --------- Aortic valve area/bsa, mean        0.7  cm^2/m^2 --------- velocity  Aorta                   Value     Reference Aortic root ID, ED            30  mm    ---------  Left atrium                Value     Reference LA ID, A-P, ES              40  mm    --------- LA ID/bsa, A-P          (H)   2.31 cm/m^2  <=2.2 LA volume, ES, 1-p A4C          70  ml    --------- LA volume/bsa, ES, 1-p A4C        40.4 ml/m^2  --------- LA volume, ES, 1-p A2C           70  ml    --------- LA volume/bsa, ES, 1-p A2C        40.4 ml/m^2  ---------  Mitral valve               Value     Reference Mitral E-wave peak velocity        104  cm/s   --------- Mitral A-wave peak velocity        160  cm/s   --------- Mitral mean velocity, D          91.6 cm/s   --------- Mitral deceleration time     (H)   271  ms    150 - 230 Mitral pressure half-time         85  ms    --------- Mitral mean gradient, D          4   mm Hg  --------- Mitral peak gradient, D          4   mm Hg  --------- Mitral E/A ratio, peak          0.7      --------- Mitral valve area, PHT, DP        2.5  cm^2   --------- Mitral valve area/bsa, PHT, DP  1.44 cm^2/m^2 --------- Mitral valve area, LVOT          2.69 cm^2   --------- continuity Mitral valve area/bsa, LVOT        1.55 cm^2/m^2 --------- continuity Mitral annulus VTI, D           42.4 cm    ---------  Pulmonary arteries            Value     Reference PA pressure, S, DP        (H)   68  mm Hg  <=30  Tricuspid valve              Value     Reference Tricuspid regurg peak velocity      404  cm/s   --------- Tricuspid peak RV-RA gradient       65  mm Hg  --------- Tricuspid maximal regurg         404  cm/s   --------- velocity, PISA  Systemic veins              Value     Reference Estimated CVP               3   mm Hg  ---------  Right ventricle              Value     Reference RV pressure, S, DP        (H)   68  mm Hg  <=30 RV s&', lateral, S             10.5 cm/s   ---------  Legend: (L) and (H) mark values outside specified reference  range.  ------------------------------------------------------------------- Prepared and Electronically Authenticated by  Dorris Carnes, M.D. 2015-10-05T18:20:52   CARDIAC CATHETERIZATION REPORT  Procedures performed:  1. Left heart catheterization  2. Selective coronary angiography  3. Left ventriculography   Reason for procedure:  Valvular heart disease: severe aortic stenosis  Procedure performed by: Sanda Klein, MD, [facility]  Complications: none   Estimated blood loss: less than 5 mL   History: [name] is 80 years old and is referred to discuss the findings of an echocardiogram that showed severe aortic stenosis (mean gradient 50 mm Hg). She denies problems with exertional dyspnea, exertional chest pain or syncope but seems to be minimizing her symptoms. She is limited by right knee pain and wants to have a knee replacement. At most she has NYHA functional class II dyspnea. She lives independently, takes care of all her household chores, drives and is very active. Her echo showed normal left ventricular systolic function, severe left ventricular hypertrophy and mildly dilated left atrium. There was mild diastolic dysfunction.  Consent: The risks, benefits, and details of the procedure were explained to the patient. Risks including death, MI, stroke, bleeding, limb ischemia, renal failure and allergy were described and accepted by the patient. Informed written consent was obtained prior to proceeding.  Technique: The patient was brought to the cardiac catheterization laboratory in the fasting state. She was prepped and draped in the usual sterile fashion. A right antecubital IV was exchanged for a 58F sheath and a 58F balloon tipped PA catheter was used to record right heart pressures. Local anesthesia with 1% lidocaine was administered to the right wrist area. Using the modified Seldinger technique a 5 French right radial artery sheath was introduced without difficulty. Under  fluoroscopic guidance, using 5 French AL1, TIG and JR catheters, selective cannulation of the left coronary artery,  right coronary artery and left ventricle were respectively performed (the latter with the JR catheter and a straight guidewire). Several coronary angiograms in a variety of projections were recorded. Left ventricular pressure and a pull back to the aorta were recorded. No immediate complications occurred. At the end of the procedure, all catheters were removed. After the procedure, hemostasis will be achieved with manual pressure/TR band.  Contrast used: 85 mL Omnipaque  Hemodynamic findings:  Aortic pressure 140/60 (mean 90) mm Hg   Left ventricle [id number] with end-diastolic pressure of 20 mm Hg  PA wedge pressure a wave 18, v wave 13 (mean 10) mm Hg  Pulmonary artery 45/20 (mean 31) mm Hg  Right ventricle 48/4 with an end-diastolic pressure of 10 mm Hg  Right atrium a wave 13, v wave 10 (mean 8) mm Hg  Cardiac output is 5.7 L per minute (cardiac index 3.4 L per minute per meter sq) Ao sat 93%, PA sat70% SVR 970 dsc(1610 dsci) PVR 436 (723 dsci)  Aortic valve mean gradient 37 mm Hg, AVA 0.77 cm sq   Angiographic Findings:  1. The left main coronary artery exhibits mild atherosclerosis and trifurcates into the left anterior descending artery, a large ramus intermedius artery and left circumflex coronary artery. There is a <30% stenosis in the distal left main.  2. The left anterior descending artery is a large vessel that reaches the apex and generates a single major diagonal branch. There is evidence of mild luminal irregularities and minimal calcification. No hemodynamically meaningful stenoses are seen. 3. The left circumflex coronary artery is a medium-size vessel non dominant vessel that generates two major oblique marginal arteries. There is evidence of mild luminal irregularities and no calcification. No hemodynamically meaningful stenoses are seen. The ramus  intermedius artery is large and bifurcates, supplying most of the lateral wall. It is free of major stenoses. 4. The right coronary artery is a large-size dominant vessel that has a slightly anomalous high and and anterior sgenerates a long posterior lateral ventricular system as well as the PDA. There is evidence of moderate luminal irregularities and mild calcification. There is a 30% proximal lesion, but no hemodynamically meaningful stenoses are seen.  5. The left ventricle is normal in size and systolic function by echo. The ascending aorta appears normal. There is severe aortic valve stenosis by pullback. The left ventricular end-diastolic pressure is elevated at 20 mm Hg.    IMPRESSIONS:  Severe aortic stenosis. Mild coronary atherosclerosis, but without significant stenoses. Elevated LVEDP, mild pulmonary artery HTN RECOMMENDATION:  Aortic valve replacement with a biological prosthesis.    Sanda Klein, MD, [place] [phone] office [phone] pager  ADDENDUM REPORT: 12/23/2013 11:46  CLINICAL DATA: Aortic Stenosis  EXAM: Cardiac TAVR CT  TECHNIQUE: The patient was scanned on a Philips 256 scanner. A 120 kV retrospective scan was triggered in the descending thoracic aorta at 111 HU's. Gantry rotation speed was 270 msecs and collimation was .9 mm. No beta blockade or nitro were given. The 3D data set was reconstructed in 5% intervals of the R-R cycle. Systolic and diastolic phases were analyzed on a dedicated work station using MPR, MIP and VRT modes. The patient received 80 cc of contrast.  FINDINGS: Aortic Valve: Trileaflet with severe calcification of leaflet tips  Aorta: STJ a bit diminutive but little calcium. Mild calcification of inferior surface of Arch. Normal origin of great vessels. Mild mixed plaque in the descending thoracic aorta  Sinotubular Junction: 2.2 cm  Ascending Thoracic  Aorta: 2.9 cm  Aortic Arch: 2.3  cm  Descending Thoracic Aorta: 2.2 cm  Sinus of Valsalva Measurements:  Non-coronary: 2.6 cm  Right -coronary: 2.5 cm  Left -coronary: 2.7 cm  Coronary Artery Height above Annulus:  Left Main: 14.6 cm  Right Coronary: 15.7 cm  Virtual Basal Annulus Measurements:  Maximum/Minimum Diameter: 2.3 cm x 1.9 cm  Perimeter: 69 mm  Area: 366 mm2  Coronary Arteries: Right dominant. Calcified including LM ostium High anterior take off to RCA. No obstructive disease see cath report  Optimum Fluoroscopic Angle for Delivery: RAO 8 degrees Cranial 1 degrees  IMPRESSION: 1) Calcified trileaflet Aortic valve  2) Somewhat diminutive Aortic root with STJ 2.2 cm but no severe calcification of STJ or annulus  3) Annulus Area 366 mm2 suitable for 23 mm Sapien XT valve  4) No adverse aortic root pathology for transfemoral delivery  5) Non obstructive CAD with calcification of LM ostium and high anterior take off or RCA origin Coronary heights sufficient for TAVR  6) Optimum angiographic angle for delivery RAO 8 degrees Cranial 1 degree  Jenkins Rouge   Electronically Signed  By: Jenkins Rouge M.D.  On: 12/23/2013 11:46   CLINICAL DATA: 80 year old female with history of severe aortic stenosis. Preprocedural study prior to potential transcatheter aortic valve replacement (TAVR) procedure.  EXAM: CTA ABDOMEN AND PELVIS WITH CONTRAST  TECHNIQUE: Multidetector CT imaging of the abdomen and pelvis was performed using the standard protocol during bolus administration of intravenous contrast. Multiplanar reconstructed images and MIPs were obtained and reviewed to evaluate the vascular anatomy.  CONTRAST: 54mL OMNIPAQUE IOHEXOL 350 MG/ML SOLN  COMPARISON: None.  FINDINGS: CT ABDOMEN AND PELVIS FINDINGS  Lower chest: Severe calcifications of the mitral annulus.  Hepatobiliary: 3.2 x 2.2 cm low-attenuation lesion in the  inferior aspect of segment 5 of the liver is compatible with a simple cyst. No other suspicious appearing hepatic lesions are noted. No intra or extrahepatic biliary ductal dilatation. Status post cholecystectomy.  Pancreas: Unremarkable.  Spleen: Unremarkable.  Adrenals/Urinary Tract: Mild bilateral adreniform thickening of the adrenal glands may suggest mild adrenal hyperplasia. Mild multifocal cortical thinning in the kidneys bilaterally, suggesting areas of post infectious or inflammatory scarring. No suspicious renal lesions. No hydroureteronephrosis. Urinary bladder is normal in appearance.  Stomach/Bowel: The enhanced appearance of the stomach is normal. No pathologic dilatation of small bowel or colon. Several colonic diverticulae are noted, without surrounding inflammatory changes to suggest an acute diverticulitis at this time.  Vascular/Lymphatic: Vascular findings and measurements pertinent to potential TAVR procedure, as detailed below. No pathologically enlarged lymph nodes are noted in the abdomen or pelvis.  Reproductive: Uterus and ovaries are atrophic.  Other: No significant volume of ascites. No pneumoperitoneum.  Musculoskeletal: There are no aggressive appearing lytic or blastic lesions noted in the visualized portions of the skeleton.  VASCULAR MEASUREMENTS PERTINENT TO TAVR:  AORTA:  Minimal Aortic Diameter - 11 x 8 mm  Severity of Aortic Calcification - severe  RIGHT PELVIS:  Right Common Iliac Artery -  Minimal Diameter - 8.4 x 7.3 mm  Tortuosity - mild  Calcification - moderate  Right External Iliac Artery -  Minimal Diameter - 7.5 x 7.2 mm  Tortuosity - moderate  Calcification - minimal  Right Common Femoral Artery -  Minimal Diameter - 6.8 x 7.0 mm  Tortuosity - mild  Calcification - mild  LEFT PELVIS:  Left Common Iliac Artery -  Minimal Diameter - 7.2 x 7.2 mm  Tortuosity -  mild  Calcification - moderate  Left External Iliac Artery -  Minimal Diameter - 6.6 x 6.4 mm  Tortuosity - mild  Calcification - none  Left Common Femoral Artery -  Minimal Diameter - 6.8 x 6.8 mm  Tortuosity - mild  Calcification - minimal  Review of the MIP images confirms the above findings.  IMPRESSION: 1. Vascular findings and measurements pertinent to potential TAVR procedure, as detailed above. This patient does appear to have suitable pelvic arterial access. 2. Incidental findings, as above.   Electronically Signed  By: Vinnie Langton M.D.  On: 12/23/2013 17:21   STS Risk Calculator  ProcedureAVR  Risk of Mortality4.6% Morbidity or Mortality21.0% Prolonged LOS9.7% Short LOS21.3% Permanent Stroke2.5% Prolonged Vent Support14.1% DSW Infection0.2% Renal Failure5.3% Reoperation8.2%   Impression:  She has severe, symptomatic aortic stenosis with progressive symptoms of exertional shortness of breath and fatigue with the recent development of intermittent paroxysmal nocturnal dyspnea and orthopnea. She is NYHA class 2-3 and seems to be worsening. I have personally reviewed the patient's recent transthoracic echocardiogram and diagnostic cardiac catheterization. Her aortic valve appears tricuspid with severe calcification and thickening involving all 3 leaflets. There is severe leaflet restriction and severe aortic stenosis with peak velocity across the aortic valve in excess of 4.5 m/sec. Her aortic root is relatively small in size with a 2.2 cm STJ. Left ventricular systolic function remains normal and  cardiac catheterization is notable for the absence of significant coronary artery disease. I think she would probably require an aortic root replacement to insert an adequate sized valve if conventional open surgery was performed, or else she would have patient-prosthesis mismatch. I think this would significantly increase the risk of surgery in this 80 year old with limited mobility and I would estimate her risk of mortality close to 15% and risk of significant morbidity in excess of 50%. Her cardiac CT shows that she would be a candidate for TAVR and I think that would be a better option for her. We discussed treatment alternatives for management of severe symptomatic aortic stenosis. Alternative approaches such as conventional aortic valve replacement, transcatheter aortic valve replacement, and palliative medical therapy were compared and contrasted at length. The risks associated with conventional surgical aortic valve replacement and possible root replacement were discussed in detail, as were expectations for post-operative convalescence. Long-term prognosis with medical therapy was discussed. She would like to proceed with TAVR.   Plan:  TAVR on 01/19/2014.    Valentina Gu. Roxy Manns, MD 01/13/2014 2:49 PM

## 2014-01-13 NOTE — Pre-Procedure Instructions (Signed)
[name]  01/13/2014   Your procedure is scheduled on:  Tuesday, December 1st  Report to [facility] Admitting at 530 AM.  Call this number if you have problems the morning of surgery: [phone]   Remember:   Do not eat food or drink liquids after midnight.   Take these medicines the morning of surgery with A SIP OF WATER: coreg, norvasc, prilosec, flonase   Do not wear jewelry, make-up or nail polish.  Do not wear lotions, powders, or perfume, deodorant.  Do not shave 48 hours prior to surgery. Men may shave face and neck.  Do not bring valuables to the hospital.  [facility] is not responsible for any belongings or valuables.               Contacts, dentures or bridgework may not be worn into surgery.  Leave suitcase in the car. After surgery it may be brought to your room.  For patients admitted to the hospital, discharge time is determined by your treatment team.         Please read over the following fact sheets that you were given: Pain Booklet, Coughing and Deep Breathing, Blood Transfusion Information, MRSA Information and Surgical Site Infection Prevention  [facility] - Preparing for Surgery  Before surgery, you can play an important role.  Because skin is not sterile, your skin needs to be as free of germs as possible.  You can reduce the number of germs on you skin by washing with CHG (chlorahexidine gluconate) soap before surgery.  CHG is an antiseptic cleaner which kills germs and bonds with the skin to continue killing germs even after washing.  Please DO NOT use if you have an allergy to CHG or antibacterial soaps.  If your skin becomes reddened/irritated stop using the CHG and inform your nurse when you arrive at Short Stay.  Do not shave (including legs and underarms) for at least 48 hours prior to the first CHG shower.  You may shave your face.  Please follow these instructions carefully:   1.  Shower with CHG Soap the night before surgery and  the morning of Surgery.  2.  If you choose to wash your hair, wash your hair first as usual with your normal shampoo.  3.  After you shampoo, rinse your hair and body thoroughly to remove the shampoo.  4.  Use CHG as you would any other liquid soap.  You can apply CHG directly to the skin and wash gently with scrungie or a clean washcloth.  5.  Apply the CHG Soap to your body ONLY FROM THE NECK DOWN.  Do not use on open wounds or open sores.  Avoid contact with your eyes, ears, mouth and genitals (private parts).  Wash genitals (private parts) with your normal soap.  6.  Wash thoroughly, paying special attention to the area where your surgery will be performed.  7.  Thoroughly rinse your body with warm water from the neck down.  8.  DO NOT shower/wash with your normal soap after using and rinsing off the CHG Soap.  9.  Pat yourself dry with a clean towel.            10.  Wear clean pajamas.            11.  Place clean sheets on your bed the night of your first shower and do not sleep with pets.  Day of Surgery  Do not apply any lotions/deoderants  the morning of surgery.  Please wear clean clothes to the hospital/surgery center. \

## 2014-01-15 ENCOUNTER — Encounter (HOSPITAL_COMMUNITY): Payer: Self-pay

## 2014-01-15 ENCOUNTER — Encounter (HOSPITAL_COMMUNITY)
Admission: RE | Admit: 2014-01-15 | Discharge: 2014-01-15 | Disposition: A | Discharge status: Home / Self Care | Payer: Medicare Other | Source: Ambulatory Visit | Attending: Cardiovascular Disease | Admitting: Cardiovascular Disease

## 2014-01-15 VITALS — BP 130/80 | HR 69 | Temp 98.6°F | Resp 20 | Ht 62.0 in | Wt 145.4 lb

## 2014-01-15 DIAGNOSIS — Z952 Presence of prosthetic heart valve: Secondary | ICD-10-CM | POA: Diagnosis not present

## 2014-01-15 DIAGNOSIS — I700 Atherosclerosis of aorta: Secondary | ICD-10-CM | POA: Diagnosis not present

## 2014-01-15 DIAGNOSIS — E785 Hyperlipidemia, unspecified: Secondary | ICD-10-CM | POA: Insufficient documentation

## 2014-01-15 DIAGNOSIS — M1990 Unspecified osteoarthritis, unspecified site: Secondary | ICD-10-CM | POA: Insufficient documentation

## 2014-01-15 DIAGNOSIS — I5032 Chronic diastolic (congestive) heart failure: Secondary | ICD-10-CM | POA: Insufficient documentation

## 2014-01-15 DIAGNOSIS — Z9049 Acquired absence of other specified parts of digestive tract: Secondary | ICD-10-CM | POA: Insufficient documentation

## 2014-01-15 DIAGNOSIS — K219 Gastro-esophageal reflux disease without esophagitis: Secondary | ICD-10-CM | POA: Diagnosis not present

## 2014-01-15 DIAGNOSIS — R011 Cardiac murmur, unspecified: Secondary | ICD-10-CM | POA: Insufficient documentation

## 2014-01-15 DIAGNOSIS — I2510 Atherosclerotic heart disease of native coronary artery without angina pectoris: Secondary | ICD-10-CM | POA: Insufficient documentation

## 2014-01-15 DIAGNOSIS — I350 Nonrheumatic aortic (valve) stenosis: Secondary | ICD-10-CM | POA: Diagnosis not present

## 2014-01-15 DIAGNOSIS — I10 Essential (primary) hypertension: Secondary | ICD-10-CM | POA: Diagnosis not present

## 2014-01-15 DIAGNOSIS — F419 Anxiety disorder, unspecified: Secondary | ICD-10-CM | POA: Insufficient documentation

## 2014-01-15 DIAGNOSIS — Z01818 Encounter for other preprocedural examination: Secondary | ICD-10-CM | POA: Diagnosis not present

## 2014-01-15 DIAGNOSIS — R0609 Other forms of dyspnea: Secondary | ICD-10-CM | POA: Insufficient documentation

## 2014-01-15 HISTORY — DX: Reserved for inherently not codable concepts without codable children: IMO0001

## 2014-01-15 HISTORY — DX: Anxiety disorder, unspecified: F41.9

## 2014-01-15 HISTORY — DX: Cardiac murmur, unspecified: R01.1

## 2014-01-15 LAB — COMPREHENSIVE METABOLIC PANEL
ALT: 17 U/L (ref 0–35)
AST: 17 U/L (ref 0–37)
Albumin: 3.9 g/dL (ref 3.5–5.2)
Alkaline Phosphatase: 56 U/L (ref 39–117)
Anion gap: 15 (ref 5–15)
BUN: 18 mg/dL (ref 6–23)
CALCIUM: 10 mg/dL (ref 8.4–10.5)
CO2: 24 mEq/L (ref 19–32)
CREATININE: 0.97 mg/dL (ref 0.50–1.10)
Chloride: 98 mEq/L (ref 96–112)
GFR calc Af Amer: 60 mL/min — ABNORMAL LOW (ref >90)
GFR calc non Af Amer: 51 mL/min — ABNORMAL LOW (ref >90)
GLUCOSE: 156 mg/dL — AB (ref 70–99)
Potassium: 4 mEq/L (ref 3.7–5.3)
SODIUM: 137 meq/L (ref 137–147)
TOTAL PROTEIN: 8.2 g/dL (ref 6.0–8.3)
Total Bilirubin: 0.3 mg/dL (ref 0.3–1.2)

## 2014-01-15 LAB — BLOOD GAS, ARTERIAL
ACID-BASE EXCESS: 2 mmol/L (ref 0.0–2.0)
Bicarbonate: 25.6 mEq/L — ABNORMAL HIGH (ref 20.0–24.0)
DRAWN BY: 428831
FIO2: 0.21 %
O2 SAT: 96 %
PATIENT TEMPERATURE: 98.6
TCO2: 26.8 mmol/L (ref 0–100)
pCO2 arterial: 37.3 mmHg (ref 35.0–45.0)
pH, Arterial: 7.451 — ABNORMAL HIGH (ref 7.350–7.450)
pO2, Arterial: 78 mmHg — ABNORMAL LOW (ref 80.0–100.0)

## 2014-01-15 LAB — URINE MICROSCOPIC-ADD ON

## 2014-01-15 LAB — URINALYSIS, ROUTINE W REFLEX MICROSCOPIC
BILIRUBIN URINE: NEGATIVE
Glucose, UA: NEGATIVE mg/dL
HGB URINE DIPSTICK: NEGATIVE
Ketones, ur: NEGATIVE mg/dL
Nitrite: NEGATIVE
PROTEIN: NEGATIVE mg/dL
Specific Gravity, Urine: 1.007 (ref 1.005–1.030)
UROBILINOGEN UA: 0.2 mg/dL (ref 0.0–1.0)
pH: 7 (ref 5.0–8.0)

## 2014-01-15 LAB — HEMOGLOBIN A1C
Hgb A1c MFr Bld: 5.4 % (ref <5.7)
MEAN PLASMA GLUCOSE: 108 mg/dL (ref <117)

## 2014-01-15 LAB — ABO/RH: ABO/RH(D): O POS

## 2014-01-15 LAB — SURGICAL PCR SCREEN
MRSA, PCR: NEGATIVE
Staphylococcus aureus: NEGATIVE

## 2014-01-15 LAB — PROTIME-INR
INR: 1.04 (ref 0.00–1.49)
PROTHROMBIN TIME: 13.7 s (ref 11.6–15.2)

## 2014-01-15 LAB — APTT: APTT: 26 s (ref 24–37)

## 2014-01-15 NOTE — Progress Notes (Addendum)
PCP is Dr. Parks Ranger Cardiologist is Dr. Sallyanne Kuster Echo and card cath noted in epic form 11-2013 Pt states that she took her fish oil this am, encouraged her not to take any more until instructed after her surgery. Voices understanding.

## 2014-01-18 MED ORDER — NOREPINEPHRINE BITARTRATE 1 MG/ML IV SOLN
0.0000 ug/min | INTRAVENOUS | Status: DC
  Filled 2014-01-18: qty 4

## 2014-01-18 MED ORDER — POTASSIUM CHLORIDE 2 MEQ/ML IV SOLN
80.0000 meq | INTRAVENOUS | Status: DC
  Filled 2014-01-18: qty 40

## 2014-01-18 MED ORDER — VANCOMYCIN HCL 10 G IV SOLR
1250.0000 mg | INTRAVENOUS | Status: AC
  Administered 2014-01-19: 1250 mg via INTRAVENOUS
  Filled 2014-01-18: qty 1250

## 2014-01-18 MED ORDER — SODIUM CHLORIDE 0.9 % IV SOLN
INTRAVENOUS | Status: DC
  Filled 2014-01-18: qty 2.5

## 2014-01-18 MED ORDER — MAGNESIUM SULFATE 50 % IJ SOLN
40.0000 meq | INTRAMUSCULAR | Status: DC
  Filled 2014-01-18: qty 10

## 2014-01-18 MED ORDER — HEPARIN SODIUM (PORCINE) 1000 UNIT/ML IJ SOLN
INTRAMUSCULAR | Status: DC
  Filled 2014-01-18: qty 30

## 2014-01-18 MED ORDER — DOPAMINE-DEXTROSE 3.2-5 MG/ML-% IV SOLN
0.0000 ug/kg/min | INTRAVENOUS | Status: DC
Start: 2014-01-19 — End: 2014-01-19
  Filled 2014-01-18: qty 250

## 2014-01-18 MED ORDER — DEXMEDETOMIDINE HCL IN NACL 400 MCG/100ML IV SOLN
0.1000 ug/kg/h | INTRAVENOUS | Status: AC
  Administered 2014-01-19: 0.2 ug/kg/h via INTRAVENOUS
  Filled 2014-01-18: qty 100

## 2014-01-18 MED ORDER — EPINEPHRINE HCL 1 MG/ML IJ SOLN
0.0000 ug/min | INTRAVENOUS | Status: DC
  Filled 2014-01-18: qty 4

## 2014-01-18 MED ORDER — DEXTROSE 5 % IV SOLN
30.0000 ug/min | INTRAVENOUS | Status: AC
  Administered 2014-01-19: 25 ug/min via INTRAVENOUS
  Filled 2014-01-18: qty 2

## 2014-01-18 MED ORDER — METOPROLOL TARTRATE 12.5 MG HALF TABLET: 12.5000 mg | ORAL_TABLET | Freq: Once | ORAL | Status: DC

## 2014-01-18 MED ORDER — NITROGLYCERIN IN D5W 200-5 MCG/ML-% IV SOLN
2.0000 ug/min | INTRAVENOUS | Status: DC
  Filled 2014-01-18: qty 250

## 2014-01-18 MED ORDER — DEXTROSE 5 % IV SOLN
1.5000 g | INTRAVENOUS | Status: AC
  Administered 2014-01-19: 1.5 g via INTRAVENOUS
  Filled 2014-01-18 (×2): qty 1.5

## 2014-01-18 NOTE — Progress Notes (Signed)
Anesthesia chart review:  Patient is a 80 year old female scheduled for TAVR, transfemoral approach on 01/19/14 by Dr. Burt Knack. She was recently being evaluated for TKR, but echo to assess murmur showed severe AS.    History includes non-smoker, HTN, HLD, GERD, severe AS, chronic diastolic CHF, anxiety, arthritis, exertional dyspnea. PCP is Dr. Parks Ranger.  Primary cardiologist is Dr. Sallyanne Kuster.   01/15/14 EKG noted.  Limb lead reversal suspected.  Repeat EKG recommended.  Will plan to do on arrival.  Echo on 11/23/13: - Left ventricle: The cavity size was normal. Wall thickness was increased in a pattern of severe LVH. Systolic function was vigorous. The estimated ejection fraction was in the range of 65% to 70%. Doppler parameters are consistent with abnormal leftventricular relaxation (grade 1 diastolic dysfunction). - Aortic valve: AV is thckened, calcified with restricted motion. Peak and mean gradients through the valve are 90 and 50 mm Hg respectively consistent with critical AS. Doppler:   VTI ratio of LVOT to aortic valve: 0.35. Valve area (VTI): 1.11 cm^2. Indexed valve area (VTI): 0.64 cm^2/m^2. Valve area (Vmax): 1.04 cm^2. Indexed valve area (Vmax): 0.6 cm^2/m^2. Mean velocity ratio of LVOT to aortic valve: 0.39. Valve area (Vmean): 1.22 cm^2. Indexed valve area (Vmean): 0.7 cm^2/m^2.  Mean gradient (S): 47 mm Hg. Peak gradient (S): 81 mm Hg. - Left atrium: The atrium was mildly dilated. - Pulmonary arteries: PA peak pressure: 68 mm Hg (S).  Cardiac cath on 12/08/13: IMPRESSIONS:  Severe aortic stenosis. Mild coronary atherosclerosis, but without significant stenoses. Elevated LVEDP, mild pulmonary artery HTN. RECOMMENDATION: Aortic valve replacement with a biological prosthesis.  Preoperative CXR, PFTs, ABG, and labs noted.  Apparently, patient was called on 01/15/14 about needing to come back in for labs either on Friday 11/27 or Monday 11/30.  She could could not come in on  11/30 due to her daughter working.  She came back in on 11/27 for labs, but unfortunately, her CBC could not be run due to insufficient specimen (need for another tube).  Patient previously stated she could not come in for labs on 11/30.  I spoke with our charge nurse Sharyn Lull.  She will need a STAT CBC on arrival.   George Hugh [facility] Short Stay Center/Anesthesiology Phone [phone] 01/18/2014 10:49 AM

## 2014-01-19 ENCOUNTER — Encounter (HOSPITAL_COMMUNITY): Payer: Self-pay | Admitting: *Deleted

## 2014-01-19 ENCOUNTER — Inpatient Hospital Stay (HOSPITAL_COMMUNITY): Payer: Medicare Other | Admitting: Anesthesiology

## 2014-01-19 ENCOUNTER — Inpatient Hospital Stay (HOSPITAL_COMMUNITY)
Admission: RE | Admit: 2014-01-19 | Discharge: 2014-01-22 | DRG: 266 | Disposition: A | Discharge status: Home / Self Care | Payer: Medicare Other | Source: Ambulatory Visit | Attending: Cardiovascular Disease | Admitting: Cardiovascular Disease

## 2014-01-19 ENCOUNTER — Encounter (HOSPITAL_COMMUNITY)
Admission: RE | Disposition: A | Discharge status: Home / Self Care | Payer: Medicare Other | Source: Ambulatory Visit | Attending: Cardiovascular Disease

## 2014-01-19 ENCOUNTER — Inpatient Hospital Stay (HOSPITAL_COMMUNITY): Payer: Medicare Other

## 2014-01-19 ENCOUNTER — Inpatient Hospital Stay (HOSPITAL_COMMUNITY): Payer: Medicare Other | Admitting: Vascular Surgery

## 2014-01-19 DIAGNOSIS — I272 Other secondary pulmonary hypertension: Secondary | ICD-10-CM | POA: Diagnosis present

## 2014-01-19 DIAGNOSIS — I5033 Acute on chronic diastolic (congestive) heart failure: Secondary | ICD-10-CM | POA: Diagnosis present

## 2014-01-19 DIAGNOSIS — I10 Essential (primary) hypertension: Secondary | ICD-10-CM | POA: Diagnosis present

## 2014-01-19 DIAGNOSIS — D62 Acute posthemorrhagic anemia: Secondary | ICD-10-CM | POA: Diagnosis not present

## 2014-01-19 DIAGNOSIS — K219 Gastro-esophageal reflux disease without esophagitis: Secondary | ICD-10-CM | POA: Diagnosis present

## 2014-01-19 DIAGNOSIS — Z952 Presence of prosthetic heart valve: Secondary | ICD-10-CM

## 2014-01-19 DIAGNOSIS — Z79899 Other long term (current) drug therapy: Secondary | ICD-10-CM | POA: Diagnosis not present

## 2014-01-19 DIAGNOSIS — M1990 Unspecified osteoarthritis, unspecified site: Secondary | ICD-10-CM | POA: Diagnosis present

## 2014-01-19 DIAGNOSIS — E785 Hyperlipidemia, unspecified: Secondary | ICD-10-CM | POA: Diagnosis present

## 2014-01-19 DIAGNOSIS — I2510 Atherosclerotic heart disease of native coronary artery without angina pectoris: Secondary | ICD-10-CM | POA: Diagnosis present

## 2014-01-19 DIAGNOSIS — I350 Nonrheumatic aortic (valve) stenosis: Secondary | ICD-10-CM | POA: Diagnosis not present

## 2014-01-19 DIAGNOSIS — I340 Nonrheumatic mitral (valve) insufficiency: Secondary | ICD-10-CM | POA: Diagnosis present

## 2014-01-19 DIAGNOSIS — R011 Cardiac murmur, unspecified: Secondary | ICD-10-CM | POA: Diagnosis present

## 2014-01-19 DIAGNOSIS — Z006 Encounter for examination for normal comparison and control in clinical research program: Secondary | ICD-10-CM

## 2014-01-19 HISTORY — DX: Presence of prosthetic heart valve: Z95.2

## 2014-01-19 HISTORY — PX: TRANSCATHETER AORTIC VALVE REPLACEMENT, TRANSFEMORAL: SHX6400

## 2014-01-19 HISTORY — PX: INTRAOPERATIVE TRANSESOPHAGEAL ECHOCARDIOGRAM: SHX5062

## 2014-01-19 LAB — POCT I-STAT 3, ART BLOOD GAS (G3+)
ACID-BASE EXCESS: 1 mmol/L (ref 0.0–2.0)
BICARBONATE: 25.1 meq/L — AB (ref 20.0–24.0)
BICARBONATE: 25.5 meq/L — AB (ref 20.0–24.0)
O2 SAT: 100 %
O2 Saturation: 96 %
TCO2: 26 mmol/L (ref 0–100)
TCO2: 27 mmol/L (ref 0–100)
pCO2 arterial: 37.8 mmHg (ref 35.0–45.0)
pCO2 arterial: 44.3 mmHg (ref 35.0–45.0)
pH, Arterial: 7.43 (ref 7.350–7.450)
pH, Arterial: 7.363 (ref 7.350–7.450)
pO2, Arterial: 306 mmHg — ABNORMAL HIGH (ref 80.0–100.0)
pO2, Arterial: 84 mmHg (ref 80.0–100.0)

## 2014-01-19 LAB — CBC
HCT: 31.1 % — ABNORMAL LOW (ref 36.0–46.0)
HEMATOCRIT: 34.6 % — AB (ref 36.0–46.0)
HEMOGLOBIN: 11.3 g/dL — AB (ref 12.0–15.0)
Hemoglobin: 10.4 g/dL — ABNORMAL LOW (ref 12.0–15.0)
MCH: 28.3 pg (ref 26.0–34.0)
MCH: 29.3 pg (ref 26.0–34.0)
MCHC: 32.7 g/dL (ref 30.0–36.0)
MCHC: 33.4 g/dL (ref 30.0–36.0)
MCV: 86.5 fL (ref 78.0–100.0)
MCV: 87.6 fL (ref 78.0–100.0)
PLATELETS: 234 10*3/uL (ref 150–400)
Platelets: 345 10*3/uL (ref 150–400)
RBC: 4 MIL/uL (ref 3.87–5.11)
RBC: 3.55 MIL/uL — ABNORMAL LOW (ref 3.87–5.11)
RDW: 13.2 % (ref 11.5–15.5)
RDW: 13.5 % (ref 11.5–15.5)
WBC: 5.4 10*3/uL (ref 4.0–10.5)
WBC: 6.2 10*3/uL (ref 4.0–10.5)

## 2014-01-19 LAB — GLUCOSE, CAPILLARY
GLUCOSE-CAPILLARY: 147 mg/dL — AB (ref 70–99)
Glucose-Capillary: 100 mg/dL — ABNORMAL HIGH (ref 70–99)
Glucose-Capillary: 102 mg/dL — ABNORMAL HIGH (ref 70–99)
Glucose-Capillary: 103 mg/dL — ABNORMAL HIGH (ref 70–99)
Glucose-Capillary: 83 mg/dL (ref 70–99)

## 2014-01-19 LAB — POCT I-STAT, CHEM 8
BUN: 15 mg/dL (ref 6–23)
BUN: 15 mg/dL (ref 6–23)
BUN: 15 mg/dL (ref 6–23)
CALCIUM ION: 1.3 mmol/L (ref 1.13–1.30)
CALCIUM ION: 1.27 mmol/L (ref 1.13–1.30)
CALCIUM ION: 1.28 mmol/L (ref 1.13–1.30)
CHLORIDE: 104 meq/L (ref 96–112)
CHLORIDE: 102 meq/L (ref 96–112)
CREATININE: 0.9 mg/dL (ref 0.50–1.10)
Chloride: 100 mEq/L (ref 96–112)
Creatinine, Ser: 0.8 mg/dL (ref 0.50–1.10)
Creatinine, Ser: 0.9 mg/dL (ref 0.50–1.10)
GLUCOSE: 111 mg/dL — AB (ref 70–99)
GLUCOSE: 139 mg/dL — AB (ref 70–99)
GLUCOSE: 130 mg/dL — AB (ref 70–99)
HCT: 27 % — ABNORMAL LOW (ref 36.0–46.0)
HCT: 25 % — ABNORMAL LOW (ref 36.0–46.0)
HEMATOCRIT: 30 % — AB (ref 36.0–46.0)
HEMOGLOBIN: 10.2 g/dL — AB (ref 12.0–15.0)
Hemoglobin: 9.2 g/dL — ABNORMAL LOW (ref 12.0–15.0)
Hemoglobin: 8.5 g/dL — ABNORMAL LOW (ref 12.0–15.0)
POTASSIUM: 3.6 meq/L — AB (ref 3.7–5.3)
Potassium: 3.5 mEq/L — ABNORMAL LOW (ref 3.7–5.3)
Potassium: 3.6 mEq/L — ABNORMAL LOW (ref 3.7–5.3)
Sodium: 138 mEq/L (ref 137–147)
Sodium: 136 mEq/L — ABNORMAL LOW (ref 137–147)
Sodium: 137 mEq/L (ref 137–147)
TCO2: 25 mmol/L (ref 0–100)
TCO2: 23 mmol/L (ref 0–100)
TCO2: 23 mmol/L (ref 0–100)

## 2014-01-19 LAB — POCT ACTIVATED CLOTTING TIME
Activated Clotting Time: 91 seconds
Activated Clotting Time: 190 seconds
Activated Clotting Time: 92 seconds

## 2014-01-19 LAB — POCT I-STAT 4, (NA,K, GLUC, HGB,HCT)
GLUCOSE: 103 mg/dL — AB (ref 70–99)
HCT: 38 % (ref 36.0–46.0)
Hemoglobin: 12.9 g/dL (ref 12.0–15.0)
Potassium: 3.5 mEq/L — ABNORMAL LOW (ref 3.7–5.3)
SODIUM: 136 meq/L — AB (ref 137–147)

## 2014-01-19 LAB — PREPARE RBC (CROSSMATCH)

## 2014-01-19 LAB — PROTIME-INR
INR: 1.24 (ref 0.00–1.49)
PROTHROMBIN TIME: 15.7 s — AB (ref 11.6–15.2)

## 2014-01-19 LAB — APTT: APTT: 31 s (ref 24–37)

## 2014-01-19 SURGERY — IMPLANTATION, AORTIC VALVE, TRANSCATHETER, FEMORAL APPROACH
Anesthesia: General | Site: Chest

## 2014-01-19 MED ORDER — PANTOPRAZOLE SODIUM 40 MG PO TBEC
40.0000 mg | DELAYED_RELEASE_TABLET | Freq: Every day | ORAL | Status: DC
  Administered 2014-01-20 – 2014-01-22 (×3): 40 mg via ORAL
  Filled 2014-01-19 (×3): qty 1

## 2014-01-19 MED ORDER — ACETAMINOPHEN 160 MG/5ML PO SOLN
1000.0000 mg | Freq: Four times a day (QID) | ORAL | Status: DC
  Filled 2014-01-19: qty 40

## 2014-01-19 MED ORDER — VANCOMYCIN HCL IN DEXTROSE 1-5 GM/200ML-% IV SOLN
1000.0000 mg | Freq: Once | INTRAVENOUS | Status: AC
  Administered 2014-01-19: 1000 mg via INTRAVENOUS
  Filled 2014-01-19: qty 200

## 2014-01-19 MED ORDER — GLYCOPYRROLATE 0.2 MG/ML IJ SOLN
INTRAMUSCULAR | Status: AC
  Filled 2014-01-19: qty 2

## 2014-01-19 MED ORDER — LIDOCAINE HCL (CARDIAC) 20 MG/ML IV SOLN
INTRAVENOUS | Status: DC | PRN
  Administered 2014-01-19: 25 mg via INTRAVENOUS

## 2014-01-19 MED ORDER — ALBUMIN HUMAN 5 % IV SOLN
INTRAVENOUS | Status: DC | PRN
  Administered 2014-01-19: 07:00:00 via INTRAVENOUS

## 2014-01-19 MED ORDER — SODIUM CHLORIDE 0.9 % IV SOLN
1.0000 mL/kg/h | INTRAVENOUS | Status: AC
  Administered 2014-01-19: 1 mL/kg/h via INTRAVENOUS

## 2014-01-19 MED ORDER — ASPIRIN 81 MG PO CHEW: 324.0000 mg | CHEWABLE_TABLET | Freq: Every day | ORAL | Status: DC

## 2014-01-19 MED ORDER — PROPOFOL 10 MG/ML IV BOLUS
INTRAVENOUS | Status: AC
  Filled 2014-01-19: qty 20

## 2014-01-19 MED ORDER — INSULIN ASPART 100 UNIT/ML ~~LOC~~ SOLN: 0.0000 [IU] | SUBCUTANEOUS | Status: DC

## 2014-01-19 MED ORDER — EPHEDRINE SULFATE 50 MG/ML IJ SOLN
INTRAMUSCULAR | Status: DC | PRN
  Administered 2014-01-19: 5 mg via INTRAVENOUS

## 2014-01-19 MED ORDER — CARVEDILOL 12.5 MG PO TABS
12.5000 mg | ORAL_TABLET | Freq: Two times a day (BID) | ORAL | Status: DC
  Administered 2014-01-20 – 2014-01-22 (×5): 12.5 mg via ORAL
  Filled 2014-01-19 (×7): qty 1

## 2014-01-19 MED ORDER — OXYCODONE HCL 5 MG PO TABS: 5.0000 mg | ORAL_TABLET | ORAL | Status: DC | PRN

## 2014-01-19 MED ORDER — MORPHINE SULFATE 2 MG/ML IJ SOLN
1.0000 mg | INTRAMUSCULAR | Status: AC | PRN
  Filled 2014-01-19: qty 1

## 2014-01-19 MED ORDER — GLYCOPYRROLATE 0.2 MG/ML IJ SOLN
INTRAMUSCULAR | Status: DC | PRN
  Administered 2014-01-19: 0.4 mg via INTRAVENOUS

## 2014-01-19 MED ORDER — PROTAMINE SULFATE 10 MG/ML IV SOLN
INTRAVENOUS | Status: DC | PRN
  Administered 2014-01-19: 70 mg via INTRAVENOUS

## 2014-01-19 MED ORDER — SODIUM CHLORIDE 0.9 % IJ SOLN
3.0000 mL | Freq: Two times a day (BID) | INTRAMUSCULAR | Status: DC
  Administered 2014-01-19 – 2014-01-21 (×4): 3 mL via INTRAVENOUS

## 2014-01-19 MED ORDER — SODIUM CHLORIDE 0.9 % IV SOLN
INTRAVENOUS | Status: DC
  Filled 2014-01-19: qty 2.5

## 2014-01-19 MED ORDER — ONDANSETRON HCL 4 MG/2ML IJ SOLN: 4.0000 mg | Freq: Four times a day (QID) | INTRAMUSCULAR | Status: DC | PRN

## 2014-01-19 MED ORDER — SODIUM CHLORIDE 0.9 % IV SOLN: INTRAVENOUS | Status: DC

## 2014-01-19 MED ORDER — SODIUM CHLORIDE 0.9 % IR SOLN
Status: DC | PRN
  Administered 2014-01-19: 500 mL

## 2014-01-19 MED ORDER — ONDANSETRON HCL 4 MG/2ML IJ SOLN: 4.0000 mg | Freq: Once | INTRAMUSCULAR | Status: AC | PRN

## 2014-01-19 MED ORDER — HEPARIN SODIUM (PORCINE) 1000 UNIT/ML IJ SOLN
INTRAMUSCULAR | Status: DC | PRN
  Administered 2014-01-19: 7000 [IU] via INTRAVENOUS

## 2014-01-19 MED ORDER — LACTATED RINGERS IV SOLN
INTRAVENOUS | Status: DC | PRN
  Administered 2014-01-19: 07:00:00 via INTRAVENOUS

## 2014-01-19 MED ORDER — NEOSTIGMINE METHYLSULFATE 10 MG/10ML IV SOLN
INTRAVENOUS | Status: DC | PRN
  Administered 2014-01-19: 3 mg via INTRAVENOUS

## 2014-01-19 MED ORDER — SODIUM CHLORIDE 0.9 % IV SOLN
2000.0000 ug | INTRAVENOUS | Status: DC | PRN
  Administered 2014-01-19: .5 ug/kg/min via INTRAVENOUS

## 2014-01-19 MED ORDER — FENTANYL CITRATE 0.05 MG/ML IJ SOLN: 25.0000 ug | INTRAMUSCULAR | Status: DC | PRN

## 2014-01-19 MED ORDER — CHLORHEXIDINE GLUCONATE 4 % EX LIQD
60.0000 mL | Freq: Once | CUTANEOUS | Status: DC
  Filled 2014-01-19: qty 60

## 2014-01-19 MED ORDER — ACETAMINOPHEN 160 MG/5ML PO SOLN
650.0000 mg | Freq: Once | ORAL | Status: AC
  Administered 2014-01-19: 650 mg
  Filled 2014-01-19: qty 20.3

## 2014-01-19 MED ORDER — CHLORHEXIDINE GLUCONATE 4 % EX LIQD
30.0000 mL | CUTANEOUS | Status: DC
  Filled 2014-01-19: qty 30

## 2014-01-19 MED ORDER — NEOSTIGMINE METHYLSULFATE 10 MG/10ML IV SOLN
INTRAVENOUS | Status: AC
  Filled 2014-01-19: qty 1

## 2014-01-19 MED ORDER — IODIXANOL 320 MG/ML IV SOLN
INTRAVENOUS | Status: DC | PRN
  Administered 2014-01-19: 96.6 mL via INTRAVENOUS

## 2014-01-19 MED ORDER — FAMOTIDINE IN NACL 20-0.9 MG/50ML-% IV SOLN
20.0000 mg | Freq: Two times a day (BID) | INTRAVENOUS | Status: DC
  Administered 2014-01-19: 20 mg via INTRAVENOUS

## 2014-01-19 MED ORDER — ONDANSETRON HCL 4 MG/2ML IJ SOLN
INTRAMUSCULAR | Status: DC | PRN
  Administered 2014-01-19: 4 mg via INTRAVENOUS

## 2014-01-19 MED ORDER — PROPOFOL 10 MG/ML IV BOLUS
INTRAVENOUS | Status: DC | PRN
  Administered 2014-01-19: 80 mg via INTRAVENOUS

## 2014-01-19 MED ORDER — PROTAMINE SULFATE 10 MG/ML IV SOLN
INTRAVENOUS | Status: AC
Start: 2014-01-19 — End: 2014-01-19
  Filled 2014-01-19: qty 10

## 2014-01-19 MED ORDER — METOPROLOL TARTRATE 1 MG/ML IV SOLN: 2.5000 mg | INTRAVENOUS | Status: DC | PRN

## 2014-01-19 MED ORDER — ALBUMIN HUMAN 5 % IV SOLN
250.0000 mL | INTRAVENOUS | Status: DC | PRN
  Filled 2014-01-19: qty 250

## 2014-01-19 MED ORDER — SODIUM CHLORIDE 0.9 % IJ SOLN
3.0000 mL | INTRAMUSCULAR | Status: DC | PRN
  Administered 2014-01-20: 3 mL via INTRAVENOUS
  Filled 2014-01-19: qty 3

## 2014-01-19 MED ORDER — FLUTICASONE PROPIONATE 50 MCG/ACT NA SUSP
1.0000 | Freq: Two times a day (BID) | NASAL | Status: DC | PRN
  Filled 2014-01-19: qty 16

## 2014-01-19 MED ORDER — INSULIN REGULAR BOLUS VIA INFUSION
0.0000 [IU] | Freq: Three times a day (TID) | INTRAVENOUS | Status: DC
  Filled 2014-01-19: qty 10

## 2014-01-19 MED ORDER — DEXTROSE 5 % IV SOLN
0.0000 ug/min | INTRAVENOUS | Status: DC
  Filled 2014-01-19: qty 2

## 2014-01-19 MED ORDER — SODIUM CHLORIDE 0.9 % IV SOLN
Freq: Once | INTRAVENOUS | Status: DC
Start: 2014-01-19 — End: 2014-01-19

## 2014-01-19 MED ORDER — MIDAZOLAM HCL 2 MG/2ML IJ SOLN: 2.0000 mg | INTRAMUSCULAR | Status: DC | PRN

## 2014-01-19 MED ORDER — CLOPIDOGREL BISULFATE 75 MG PO TABS
75.0000 mg | ORAL_TABLET | Freq: Every day | ORAL | Status: DC
  Administered 2014-01-20 – 2014-01-22 (×3): 75 mg via ORAL
  Filled 2014-01-19 (×4): qty 1

## 2014-01-19 MED ORDER — DEXTROSE 5 % IV SOLN
1.5000 g | Freq: Two times a day (BID) | INTRAVENOUS | Status: AC
  Administered 2014-01-19 – 2014-01-21 (×4): 1.5 g via INTRAVENOUS
  Filled 2014-01-19 (×4): qty 1.5

## 2014-01-19 MED ORDER — EPHEDRINE SULFATE 50 MG/ML IJ SOLN
INTRAMUSCULAR | Status: AC
  Filled 2014-01-19: qty 1

## 2014-01-19 MED ORDER — TRAMADOL HCL 50 MG PO TABS: 50.0000 mg | ORAL_TABLET | ORAL | Status: DC | PRN

## 2014-01-19 MED ORDER — NITROGLYCERIN IN D5W 200-5 MCG/ML-% IV SOLN: 0.0000 ug/min | INTRAVENOUS | Status: DC

## 2014-01-19 MED ORDER — ROCURONIUM BROMIDE 50 MG/5ML IV SOLN
INTRAVENOUS | Status: AC
  Filled 2014-01-19: qty 1

## 2014-01-19 MED ORDER — POTASSIUM CHLORIDE 10 MEQ/50ML IV SOLN
10.0000 meq | INTRAVENOUS | Status: AC
  Administered 2014-01-19 (×3): 10 meq via INTRAVENOUS

## 2014-01-19 MED ORDER — PHENYLEPHRINE 40 MCG/ML (10ML) SYRINGE FOR IV PUSH (FOR BLOOD PRESSURE SUPPORT)
PREFILLED_SYRINGE | INTRAVENOUS | Status: AC
  Filled 2014-01-19: qty 10

## 2014-01-19 MED ORDER — FENTANYL CITRATE 0.05 MG/ML IJ SOLN
INTRAMUSCULAR | Status: DC | PRN
  Administered 2014-01-19: 50 ug via INTRAVENOUS

## 2014-01-19 MED ORDER — FENTANYL CITRATE 0.05 MG/ML IJ SOLN
INTRAMUSCULAR | Status: AC
  Filled 2014-01-19: qty 5

## 2014-01-19 MED ORDER — LACTATED RINGERS IV SOLN: 500.0000 mL | Freq: Once | INTRAVENOUS | Status: AC | PRN

## 2014-01-19 MED ORDER — LIDOCAINE HCL (CARDIAC) 20 MG/ML IV SOLN
INTRAVENOUS | Status: AC
  Filled 2014-01-19: qty 5

## 2014-01-19 MED ORDER — ACETAMINOPHEN 650 MG RE SUPP: 650.0000 mg | Freq: Once | RECTAL | Status: AC

## 2014-01-19 MED ORDER — ACETAMINOPHEN 500 MG PO TABS
1000.0000 mg | ORAL_TABLET | Freq: Four times a day (QID) | ORAL | Status: DC
  Administered 2014-01-20 – 2014-01-22 (×7): 1000 mg via ORAL
  Filled 2014-01-19 (×16): qty 2

## 2014-01-19 MED ORDER — SODIUM CHLORIDE 0.9 % IV SOLN: 250.0000 mL | INTRAVENOUS | Status: DC | PRN

## 2014-01-19 MED ORDER — ASPIRIN EC 325 MG PO TBEC
325.0000 mg | DELAYED_RELEASE_TABLET | Freq: Every day | ORAL | Status: DC
  Administered 2014-01-20: 325 mg via ORAL
  Filled 2014-01-19: qty 1

## 2014-01-19 MED ORDER — SODIUM CHLORIDE 0.9 % IJ SOLN
INTRAMUSCULAR | Status: AC
  Filled 2014-01-19: qty 10

## 2014-01-19 MED ORDER — MORPHINE SULFATE 2 MG/ML IJ SOLN: 2.0000 mg | INTRAMUSCULAR | Status: DC | PRN

## 2014-01-19 MED ORDER — HEPARIN SODIUM (PORCINE) 1000 UNIT/ML IJ SOLN
INTRAMUSCULAR | Status: AC
  Filled 2014-01-19: qty 1

## 2014-01-19 MED ORDER — ONDANSETRON HCL 4 MG/2ML IJ SOLN
INTRAMUSCULAR | Status: AC
  Filled 2014-01-19: qty 2

## 2014-01-19 MED ORDER — ROCURONIUM BROMIDE 100 MG/10ML IV SOLN
INTRAVENOUS | Status: DC | PRN
  Administered 2014-01-19: 25 mg via INTRAVENOUS

## 2014-01-19 MED ORDER — PANTOPRAZOLE SODIUM 40 MG PO TBEC: 40.0000 mg | DELAYED_RELEASE_TABLET | Freq: Every day | ORAL | Status: DC

## 2014-01-19 MED ORDER — DEXMEDETOMIDINE HCL IN NACL 200 MCG/50ML IV SOLN: 0.1000 ug/kg/h | INTRAVENOUS | Status: DC

## 2014-01-19 MED FILL — Sodium Chloride IV Soln 0.9%: INTRAVENOUS | Qty: 1000 | Status: AC

## 2014-01-19 SURGICAL SUPPLY — 96 items
BAG BANDED W/RUBBER/TAPE 36X54 (MISCELLANEOUS) ×3 IMPLANT
BAG DECANTER FOR FLEXI CONT (MISCELLANEOUS) IMPLANT
BAG SNAP BAND KOVER 36X36 (MISCELLANEOUS) ×6 IMPLANT
BLADE STERNUM SYSTEM 6 (BLADE) ×3 IMPLANT
BLADE SURG ROTATE 9660 (MISCELLANEOUS) IMPLANT
CABLE PACING FASLOC BLUE (MISCELLANEOUS) ×3 IMPLANT
CANISTER SUCTION 2500CC (MISCELLANEOUS) IMPLANT
CANNULA FEM VENOUS REMOTE 22FR (CANNULA) IMPLANT
CANNULA OPTISITE PERFUSION 16F (CANNULA) IMPLANT
CANNULA OPTISITE PERFUSION 18F (CANNULA) IMPLANT
CATH DIAG EXPO 6F AL2 (CATHETERS) ×3 IMPLANT
CATH DIAG EXPO 6F VENT PIG 145 (CATHETERS) ×6 IMPLANT
CATH S G BIP PACING (SET/KITS/TRAYS/PACK) ×3 IMPLANT
CATH SOFT-VU 4F 65 STRAIGHT (CATHETERS) ×2 IMPLANT
CATH SOFT-VU STRAIGHT 4F 65CM (CATHETERS) ×1
CLIP TI MEDIUM 24 (CLIP) ×3 IMPLANT
CLIP TI WIDE RED SMALL 24 (CLIP) ×3 IMPLANT
COVER DOME SNAP 22 D (MISCELLANEOUS) ×3 IMPLANT
COVER MAYO STAND STRL (DRAPES) ×3 IMPLANT
COVER SURGICAL LIGHT HANDLE (MISCELLANEOUS) ×3 IMPLANT
COVER TABLE BACK 60X90 (DRAPES) ×3 IMPLANT
CRADLE DONUT ADULT HEAD (MISCELLANEOUS) ×3 IMPLANT
DERMABOND ADHESIVE PROPEN (GAUZE/BANDAGES/DRESSINGS) ×1
DERMABOND ADVANCED (GAUZE/BANDAGES/DRESSINGS) ×1
DERMABOND ADVANCED .7 DNX12 (GAUZE/BANDAGES/DRESSINGS) ×2 IMPLANT
DERMABOND ADVANCED .7 DNX6 (GAUZE/BANDAGES/DRESSINGS) ×2 IMPLANT
DEVICE INFLATION 20/61 (MISCELLANEOUS) ×6 IMPLANT
DRAPE INCISE IOBAN 66X45 STRL (DRAPES) IMPLANT
DRAPE TABLE COVER HEAVY DUTY (DRAPES) ×3 IMPLANT
DRSG TEGADERM 4X4.75 (GAUZE/BANDAGES/DRESSINGS) ×3 IMPLANT
ELECT REM PT RETURN 9FT ADLT (ELECTROSURGICAL) ×6
ELECTRODE REM PT RTRN 9FT ADLT (ELECTROSURGICAL) ×4 IMPLANT
FELT TEFLON 6X6 (MISCELLANEOUS) ×3 IMPLANT
GAUZE SPONGE 4X4 12PLY STRL (GAUZE/BANDAGES/DRESSINGS) ×3 IMPLANT
GLOVE BIOGEL PI IND STRL 6 (GLOVE) ×6 IMPLANT
GLOVE BIOGEL PI IND STRL 6.5 (GLOVE) ×8 IMPLANT
GLOVE BIOGEL PI INDICATOR 6 (GLOVE) ×3
GLOVE BIOGEL PI INDICATOR 6.5 (GLOVE) ×4
GLOVE ECLIPSE 7.5 STRL STRAW (GLOVE) ×3 IMPLANT
GLOVE ECLIPSE 8.0 STRL XLNG CF (GLOVE) ×6 IMPLANT
GLOVE EUDERMIC 7 POWDERFREE (GLOVE) ×3 IMPLANT
GLOVE ORTHO TXT STRL SZ7.5 (GLOVE) ×3 IMPLANT
GOWN STRL REUS W/ TWL LRG LVL3 (GOWN DISPOSABLE) ×6 IMPLANT
GOWN STRL REUS W/ TWL XL LVL3 (GOWN DISPOSABLE) ×12 IMPLANT
GOWN STRL REUS W/TWL LRG LVL3 (GOWN DISPOSABLE) ×3
GOWN STRL REUS W/TWL XL LVL3 (GOWN DISPOSABLE) ×6
GUIDEWIRE AMPLATZ STIFF 0.35 (WIRE) ×3 IMPLANT
GUIDEWIRE SAF TJ AMPL .035X180 (WIRE) ×3 IMPLANT
GUIDEWIRE SAFE TJ AMPLATZ EXST (WIRE) ×3 IMPLANT
GUIDEWIRE STRAIGHT .035 260CM (WIRE) ×3 IMPLANT
INSERT FOGARTY SM (MISCELLANEOUS) ×6 IMPLANT
KIT BASIN OR (CUSTOM PROCEDURE TRAY) ×3 IMPLANT
KIT DILATOR VASC 18G NDL (KITS) IMPLANT
KIT HEART LEFT (KITS) ×3 IMPLANT
KIT ROOM TURNOVER OR (KITS) ×3 IMPLANT
KIT SUCTION CATH 14FR (SUCTIONS) ×6 IMPLANT
LEAD PACING MYOCARDI (MISCELLANEOUS) IMPLANT
NEEDLE PERC 18GX7CM (NEEDLE) ×3 IMPLANT
NS IRRIG 1000ML POUR BTL (IV SOLUTION) ×9 IMPLANT
PACK AORTA (CUSTOM PROCEDURE TRAY) ×3 IMPLANT
PAD ARMBOARD 7.5X6 YLW CONV (MISCELLANEOUS) ×6 IMPLANT
PAD ELECT DEFIB RADIOL ZOLL (MISCELLANEOUS) ×3 IMPLANT
SET CANNULATION TOURNIQUET (MISCELLANEOUS) IMPLANT
SHEATH PINNACLE 6F 10CM (SHEATH) ×6 IMPLANT
SPONGE LAP 4X18 X RAY DECT (DISPOSABLE) ×3 IMPLANT
STOPCOCK MORSE 400PSI 3WAY (MISCELLANEOUS) ×3 IMPLANT
SUT ETHIBOND 2 0 SH (SUTURE) ×1
SUT ETHIBOND 2 0 SH 36X2 (SUTURE) ×2 IMPLANT
SUT ETHIBOND X763 2 0 SH 1 (SUTURE) IMPLANT
SUT MNCRL AB 3-0 PS2 18 (SUTURE) ×3 IMPLANT
SUT PDS AB 1 CTX 36 (SUTURE) IMPLANT
SUT PROLENE 2 0 MH 48 (SUTURE) IMPLANT
SUT PROLENE 3 0 SH1 36 (SUTURE) IMPLANT
SUT PROLENE 4 0 RB 1 (SUTURE) ×1
SUT PROLENE 4-0 RB1 .5 CRCL 36 (SUTURE) ×2 IMPLANT
SUT PROLENE 5 0 C 1 36 (SUTURE) ×6 IMPLANT
SUT PROLENE 6 0 C 1 30 (SUTURE) ×6 IMPLANT
SUT SILK  1 MH (SUTURE) ×1
SUT SILK 1 MH (SUTURE) ×2 IMPLANT
SUT SILK 2 0 SH CR/8 (SUTURE) ×3 IMPLANT
SUT TEM PAC WIRE 2 0 SH (SUTURE) IMPLANT
SUT VIC AB 2-0 CTX 36 (SUTURE) IMPLANT
SUT VIC AB 3-0 SH 8-18 (SUTURE) ×6 IMPLANT
SYR 30ML LL (SYRINGE) ×6 IMPLANT
SYR 50ML LL SCALE MARK (SYRINGE) ×3 IMPLANT
TAPE CLOTH SURG 4X10 WHT LF (GAUZE/BANDAGES/DRESSINGS) ×3 IMPLANT
TOWEL OR 17X24 6PK STRL BLUE (TOWEL DISPOSABLE) ×9 IMPLANT
TOWEL OR 17X26 10 PK STRL BLUE (TOWEL DISPOSABLE) ×6 IMPLANT
TRANSDUCER W/STOPCOCK (MISCELLANEOUS) ×6 IMPLANT
TRAY FOLEY IC TEMP SENS 14FR (CATHETERS) ×3 IMPLANT
TUBING ART PRESS 48 MALE/FEM (TUBING) ×3 IMPLANT
TUBING HIGH PRESSURE 120CM (CONNECTOR) ×3 IMPLANT
UNDERPAD 30X30 INCONTINENT (UNDERPADS AND DIAPERS) ×3 IMPLANT
VALVE HEART TRANSCATH SZ3 23MM (Prosthesis & Implant Heart) ×3 IMPLANT
WATER STERILE IRR 1000ML POUR (IV SOLUTION) ×6 IMPLANT
WIRE .035 3MM-J 145CM (WIRE) ×3 IMPLANT

## 2014-01-19 NOTE — Plan of Care (Signed)
Problem: Phase II - Intermediate Post-Op Goal: Wean to Extubate Outcome: Completed/Met Date Met:  01/19/14 Goal: Maintain Hemodynamic Stability Outcome: Completed/Met Date Met:  01/19/14 Goal: CBGs/Blood Glucose per SCIP Criteria Outcome: Completed/Met Date Met:  01/19/14

## 2014-01-19 NOTE — Op Note (Signed)
CARDIOTHORACIC SURGERY OPERATIVE NOTE  Date of Procedure:  01/19/2014  Preoperative Diagnosis: Severe Aortic Stenosis   Postoperative Diagnosis: Same   Procedure:    Transcatheter Aortic Valve Replacement - Open Right Transfemoral Approach  Edwards Sapien 3 Transcatheter Heart Valve (size 23 mm, model # 9600TFX, serial # [id number])   Co-Surgeons:  Valentina Gu. Roxy Manns, MD and Sherren Mocha, MD  Assistants:   Gaye Pollack, MD and Lauree Chandler, MD  Anesthesiologist:  Roberts Gaudy, MD  Echocardiographer:  Jenkins Rouge, MD  Pre-operative Echo Findings:  Severe aortic stenosis  Normal left ventricular systolic function  Mild mitral regurgitation  Post-operative Echo Findings:  No paravalvular leak  Normal left ventricular systolic function      DETAILS OF THE OPERATIVE PROCEDURE  The majority of the procedure is documented separately in a procedure note by Dr. Burt Knack.   TRANSFEMORAL ACCESS:   A small incision is made in the right groin immediately over the common femoral artery. The subcutaneous tissues are divided with electrocautery and the anterior surface of the common femoral artery is identified. Sharp dissection is utilized to free up the artery proximally and distally and the vessel is encircled with a vessel loop.  A pair of CV-4 Gore-tex sutures are place as diamond-shaped purse-strings on the anterior surface of the femoral artery.  The patient is heparinized systemically and ACT verified > 250 seconds.  The common femoral artery is punctured using an 18 gauge needle and a soft J-tipped guidewire is passed into the common iliac artery under fluoroscopic guidance.  A 6 Fr straight diagnostic catheter is placed over the guidewire and the guidewire is removed.  An Amplatz super stiff guidewire is passed through the sheath into the descending thoracic aorta and the introducing diagnostic catheter is removed.  Serial dilators are passed over the guidewire under  continuous fluoroscopic guidance, making certain that each dilator passes easily all of the way into the distal abdominal aorta.  A 14 Fr Commander introducer sheath is passed over the guidewire into the abdominal aorta.  The introducing dilator is removed, the sheath is flushed with heparinized saline, and the sheath is secured to the skin.    FEMORAL SHEATH REMOVAL AND ARTERIAL CLOSURE:  After the completion of successful valve deployment as documented separately by Dr. Burt Knack, the femoral artery sheath is removed and the arteriotomy is closed using the previously placed Gore-tex purse-string sutures. Once the repair has been completed protamine was administered to reverse the anticoagulation. A digitally-subtracted arteriogram is obtained from just above the aortic bifurcation to below the arteriotomy to confirm the integrity of the vascular repair.  The incision is irrigated with saline solution and subsequently closed in multiple layers using absorbable suture.  The skin incision is closed using a subcuticular skin closure.     Valentina Gu. Roxy Manns MD 01/19/2014 10:00 AM

## 2014-01-19 NOTE — Progress Notes (Signed)
PM rounding note:  The patient is doing well. Awake and alert without complaints. Bilateral groin sites are clear, heart is regular, lungs clear on exam. Tele shows NSR. Pt on no vasoactive meds and hemodynamics are stable.   Plan: d/c A-line, d/c Swan. Anticipate transfer out of ICU in am.   Sherren Mocha 01/19/2014 4:18 PM'

## 2014-01-19 NOTE — Progress Notes (Signed)
Utilization Review Completed.Donne Anon T12/02/2013

## 2014-01-19 NOTE — Progress Notes (Signed)
  Echocardiogram Echocardiogram Transesophageal has been performed.  [name] 01/19/2014, 9:43 AM

## 2014-01-19 NOTE — OR Nursing (Signed)
Sheaths pulled from left groin and pressure held for 20 for each the femoral and arterial sheaths by Jennye Moccasin

## 2014-01-19 NOTE — Anesthesia Preprocedure Evaluation (Signed)
Anesthesia Evaluation  Patient identified by MRN, date of birth, ID band Patient awake    Reviewed: Allergy & Precautions, H&P , NPO status , Patient's Chart, lab work & pertinent test results  Airway Mallampati: II  TM Distance: >3 FB     Dental  (+) Teeth Intact, Dental Advisory Given   Pulmonary  breath sounds clear to auscultation        Cardiovascular hypertension, Rhythm:Regular Rate:Normal + Systolic murmurs    Neuro/Psych    GI/Hepatic   Endo/Other    Renal/GU      Musculoskeletal   Abdominal   Peds  Hematology   Anesthesia Other Findings   Reproductive/Obstetrics                             Anesthesia Physical Anesthesia Plan  ASA: III  Anesthesia Plan: General   Post-op Pain Management:    Induction: Intravenous  Airway Management Planned: Oral ETT  Additional Equipment: Arterial line, PA Cath and CVP  Intra-op Plan:   Post-operative Plan:   Informed Consent: I have reviewed the patients History and Physical, chart, labs and discussed the procedure including the risks, benefits and alternatives for the proposed anesthesia with the patient or authorized representative who has indicated his/her understanding and acceptance.   Dental advisory given  Plan Discussed with: CRNA and Anesthesiologist  Anesthesia Plan Comments: (Severe Aortic stenosis zhypertension GERD  Plan GA with oral ETT  Roberts Gaudy)        Anesthesia Quick Evaluation

## 2014-01-19 NOTE — H&P (View-Only) (Signed)
HPI:  80 year-old woman presenting for evaluation of severe symptomatic aortic stenosis. She has a longstanding heart murmur. She recently presented for preoperative evaluation for elective left total knee replacement and was noted to have a loud murmur. She was referred to cardiology and an echo showed severe aortic stenosis. The patient was subsequently referred to Dr Roxy Manns for surgical evaluation and she is now being considered for TAVR as an alternative to high-risk conventional aortic valve replacement in the setting of advanced age, severe arthritis, and a small aortic root which would like require root enlargement.   The patient reports progressive fatigue and exertional dyspnea over the past 6 months. She is currently quite limited by exertional dyspnea and she describes NYHA Functional Class III symptoms. She denies resting shortness of breath, chest pain at rest or with exertion, or lightheadedness. She has no past history of ischemic heart disease, angina, or MI. She's has no major surgeries, other than a cholecystectomy about 10 years ago.   She is also limited by osteoarthritis with chronic left knee pain and difficulty with ambulation. Also complains of right knee pain. She fell about 6 years ago and dislocated her shoulder. She didn't require surgery but suffers from chronic should pain as a result of this injury. Otherwise she has been in reasonably good health.   The patient has been widowed for 25 years. She worked at [employer] until she was 80 years old. She has 3 children who live locally and 4 grandchildren.   Outpatient Encounter Prescriptions as of 01/01/2014  Medication Sig  . amLODipine (NORVASC) 10 MG tablet Take 10 mg by mouth daily.  . carvedilol (COREG) 12.5 MG tablet Take 12.5 mg by mouth 2 (two) times daily.   . cholecalciferol (VITAMIN D) 1000 UNITS tablet Take 1,000 Units by mouth daily.  . fenofibrate micronized (LOFIBRA) 200 MG capsule Take 200 mg by  mouth daily before breakfast.  . fish oil-omega-3 fatty acids 1000 MG capsule Take 1 g by mouth daily. With 300 mg of Omega  . fluticasone (VERAMYST) 27.5 MCG/SPRAY nasal spray Place 1 spray into both nostrils 2 (two) times daily as needed for allergies.   . hydrochlorothiazide (HYDRODIURIL) 25 MG tablet Take 25 mg by mouth daily.  Marland Kitchen omeprazole (PRILOSEC) 40 MG capsule Take 40 mg by mouth daily.    Aspirin and Codeine  Past Medical History  Diagnosis Date  . Hypertension   . Hyperlipidemia   . GERD (gastroesophageal reflux disease) 05/14/2011  . Arthritis     Both knees R>L  . Chronic diastolic congestive heart failure, NYHA class 2   . Severe aortic stenosis 12/06/2013    Past Surgical History  Procedure Laterality Date  . Cholecystectomy  2008  . Tonsillectomy      70 years ago    History   Social History  . Marital Status: Widowed    Spouse Name: N/A    Number of Children: 3  . Years of Education: N/A   Occupational History  . Not on file.   Social History Main Topics  . Smoking status: Never Smoker   . Smokeless tobacco: Not on file  . Alcohol Use: No  . Drug Use: No  . Sexual Activity: Not on file   Other Topics Concern  . Not on file   Social History Narrative   Widowed x 25 yrs,  Retired from [employer], lives alone - functionally independent    Family History  Problem Relation Age  of Onset  . Breast cancer Sister     2 sisters  . Colon cancer Neg Hx   . Muscular dystrophy Father     Died at 78    ROS:   General: no fevers/chills/night sweats, positive for fatigue Eyes: no blurry vision, diplopia, or amaurosis ENT: no sore throat or hearing loss Resp: no cough, wheezing, or hemoptysis CV: no edema or palpitations, otherwise see HPI GI: no abdominal pain, nausea, vomiting, diarrhea, or constipation GU: no dysuria, frequency, or hematuria Skin: no rash Neuro: no headache, numbness, tingling, or weakness of extremities Musculoskeletal:  see HPI Heme: no bleeding, DVT, or easy bruising Endo: no polydipsia or polyuria  BP 160/78 mmHg  Pulse 87  Ht 5\' 2"  (1.575 m)  Wt 146 lb 1.9 oz (66.28 kg)  BMI 26.72 kg/m2  PHYSICAL EXAM: Pt is alert and oriented, WD, WN, pleasant elderly woman in no distress. HEENT: normal Neck: JVP normal. Carotid upstrokes delayed with bilateral bruits. No thyromegaly. Lungs: equal expansion, clear bilaterally CV: Apex is discrete and nondisplaced, RRR with grade 3/6 harsh systolic murmur at the RUSB, late peaking with absent A2 Abd: soft, NT, +BS, no bruit, no hepatosplenomegaly Back: no CVA tenderness Ext: no C/C/E        Femoral pulses 1+=        DP/PT pulses intact and = Skin: warm and dry without rash Neuro: CNII-XII intact             Strength intact = bilaterally  2D Echo: Study Conclusions  - Left ventricle: The cavity size was normal. Wall thickness was increased in a pattern of severe LVH. Systolic function was vigorous. The estimated ejection fraction was in the range of 65% to 70%. Doppler parameters are consistent with abnormal left ventricular relaxation (grade 1 diastolic dysfunction). - Aortic valve: AV is thckened, calcified with restricted motion. Peak and mean gradients through the valve are 90 and 50 mm Hg respectively consistent with critical AS. - Left atrium: The atrium was mildly dilated. - Pulmonary arteries: PA peak pressure: 68 mm Hg (S).  CTA Chest/Abdomen/Pelvis: VASCULAR MEASUREMENTS PERTINENT TO TAVR:  AORTA:  Minimal Aortic Diameter - 11 x 8 mm  Severity of Aortic Calcification - severe  RIGHT PELVIS:  Right Common Iliac Artery -  Minimal Diameter - 8.4 x 7.3 mm  Tortuosity - mild  Calcification - moderate  Right External Iliac Artery -  Minimal Diameter - 7.5 x 7.2 mm  Tortuosity - moderate  Calcification - minimal  Right Common Femoral Artery -  Minimal Diameter - 6.8 x 7.0 mm  Tortuosity -  mild  Calcification - mild  LEFT PELVIS:  Left Common Iliac Artery -  Minimal Diameter - 7.2 x 7.2 mm  Tortuosity - mild  Calcification - moderate  Left External Iliac Artery -  Minimal Diameter - 6.6 x 6.4 mm  Tortuosity - mild  Calcification - none  Left Common Femoral Artery -  Minimal Diameter - 6.8 x 6.8 mm  Tortuosity - mild  Calcification - minimal  Review of the MIP images confirms the above findings.  IMPRESSION: 1. Vascular findings and measurements pertinent to potential TAVR procedure, as detailed above. This patient does appear to have suitable pelvic arterial access.  Gated Cardiac CTA: FINDINGS: Aortic Valve: Trileaflet with severe calcification of leaflet tips  Aorta: STJ a bit diminutive but little calcium. Mild calcification of inferior surface of Arch. Normal origin of great vessels. Mild mixed plaque in the descending thoracic aorta  Sinotubular Junction: 2.2 cm  Ascending Thoracic Aorta: 2.9 cm  Aortic Arch: 2.3 cm  Descending Thoracic Aorta: 2.2 cm  Sinus of Valsalva Measurements:  Non-coronary: 2.6 cm  Right -coronary: 2.5 cm  Left -coronary: 2.7 cm  Coronary Artery Height above Annulus:  Left Main: 14.6 cm  Right Coronary: 15.7 cm  Virtual Basal Annulus Measurements:  Maximum/Minimum Diameter: 2.3 cm x 1.9 cm  Perimeter: 69 mm  Area: 366 mm2  Coronary Arteries: Right dominant. Calcified including LM ostium High anterior take off to RCA. No obstructive disease see cath report  Optimum Fluoroscopic Angle for Delivery: RAO 8 degrees Cranial 1 degrees  IMPRESSION: 1) Calcified trileaflet Aortic valve  2) Somewhat diminutive Aortic root with STJ 2.2 cm but no severe calcification of STJ or annulus  3) Annulus Area 366 mm2 suitable for 23 mm Sapien XT valve  4) No adverse aortic root pathology for transfemoral delivery  5) Non obstructive CAD with  calcification of LM ostium and high anterior take off or RCA origin Coronary heights sufficient for TAVR  6) Optimum angiographic angle for delivery RAO 8 degrees Cranial 1 Degree  Cardiac Cath: Hemodynamic findings:  Aortic pressure 140/60 (mean 90) mm Hg   Left ventricle [id number] with end-diastolic pressure of 20 mm Hg  PA wedge pressure a wave 18, v wave 13 (mean 10) mm Hg  Pulmonary artery 45/20 (mean 31) mm Hg  Right ventricle 48/4 with an end-diastolic pressure of 10 mm Hg  Right atrium a wave 13, v wave 10 (mean 8) mm Hg  Cardiac output is 5.7 L per minute (cardiac index 3.4 L per minute per meter sq) Ao sat 93%, PA sat70% SVR 970 dsc(1610 dsci) PVR 436 (723 dsci)  Aortic valve mean gradient 37 mm Hg, AVA 0.77 cm sq   Angiographic Findings:  1. The left main coronary artery exhibits mild atherosclerosis and trifurcates into the left anterior descending artery, a large ramus intermedius artery and left circumflex coronary artery. There is a <30% stenosis in the distal left main.  2. The left anterior descending artery is a large vessel that reaches the apex and generates a single major diagonal branch. There is evidence of mild luminal irregularities and minimal calcification. No hemodynamically meaningful stenoses are seen. 3. The left circumflex coronary artery is a medium-size vessel non dominant vessel that generates two major oblique marginal arteries. There is evidence of mild luminal irregularities and no calcification. No hemodynamically meaningful stenoses are seen. The ramus intermedius artery is large and bifurcates, supplying most of the lateral wall. It is free of major stenoses. 4. The right coronary artery is a large-size dominant vessel that has a slightly anomalous high and and anterior sgenerates a long posterior lateral ventricular system as well as the PDA. There is evidence of moderate luminal irregularities and mild calcification. There is a 30% proximal  lesion, but no hemodynamically meaningful stenoses are seen.  5. The left ventricle is normal in size and systolic function by echo. The ascending aorta appears normal. There is severe aortic valve stenosis by pullback. The left ventricular end-diastolic pressure is elevated at 20 mm Hg.    IMPRESSIONS:  Severe aortic stenosis. Mild coronary atherosclerosis, but without significant stenoses. Elevated LVEDP, mild pulmonary artery HTN RECOMMENDATION:  Aortic valve replacement with a biological prosthesis.   ASSESSMENT AND PLAN: This is ann 80 year-old woman with severe symptomatic aortic stenosis, Stage D. She is significantly limited by exertional dyspnea with NYHA Functional Class III symptoms. I  suspect aortic stenosis is the primary cause of her symptoms.   I have reviewed her records, echo study, CT scans of the heart and peripheral vasculature, and cardiac cath films. We discussed the natural history and poor prognosis associated with severe symptomatic aortic stenosis. We reviewed treatment options in detail, including conventional surgery, TAVR, and palliative medical therapy. I agree with Dr Roxy Manns that TAVR is a reasonable treatment alternative for this elderly but functional woman with signficant comorbid medical conditions who would likely require root enlargement to because of her small aortic annulus.   Following the decision to proceed with transcatheter aortic valve replacement, a discussion has been held regarding what types of management strategies would be attempted intraoperatively in the event of life-threatening complications, including whether or not the patient would be considered a candidate for the use of cardiopulmonary bypass and/or conversion to open sternotomy for attempted surgical intervention.  The patient has been advised of a variety of complications that might develop including but not limited to risks of death, stroke, paravalvular leak, aortic dissection or  other major vascular complications, aortic annulus rupture, device embolization, cardiac rupture or perforation, mitral regurgitation, acute myocardial infarction, arrhythmia, heart block or bradycardia requiring permanent pacemaker placement, congestive heart failure, respiratory failure, renal failure, pneumonia, infection, other late complications related to structural valve deterioration or migration, or other complications that might ultimately cause a temporary or permanent loss of functional independence or other long term morbidity.  The patient provides full informed consent for the procedure as described and all questions were answered.  She is concerned about her disposition after TAVR and does not want to be a burden to her family. I advised her that we would have a post-operative PT consult to help determine her disposition and whether she would be able to return directly with family support for a short period versus ST-SNF for rehab. She requests [facility] in case she requires ST-SNF. She is tentatively scheduled for TAVR Tuesday January 19, 2014.   Sherren Mocha, MD 01/01/2014 11:17 PM

## 2014-01-19 NOTE — Interval H&P Note (Signed)
History and Physical Interval Note:  01/19/2014 7:34 AM  [name]  has presented today for surgery, with the diagnosis of SEVERE AS  The various methods of treatment have been discussed with the patient and family. After consideration of risks, benefits and other options for treatment, the patient has consented to  Procedure(s): TRANSCATHETER AORTIC VALVE REPLACEMENT, TRANSFEMORAL (N/A) INTRAOPERATIVE TRANSESOPHAGEAL ECHOCARDIOGRAM (N/A) as a surgical intervention .  The patient's history has been reviewed, patient examined, no change in status, stable for surgery.  I have reviewed the patient's chart and labs.  Questions were answered to the patient's satisfaction.    Pt seen and evaluated. No changes to add to H&P above. The patient has been evaluated by the multidisciplinary valve team and we plan TF-TAVR today with a 23 mm Sapien 3 valve.   Sherren Mocha

## 2014-01-19 NOTE — Progress Notes (Addendum)
TCTS BRIEF SICU PROGRESS NOTE  Day of Surgery  S/P Procedure(s) (LRB): TRANSCATHETER AORTIC VALVE REPLACEMENT, TRANSFEMORAL (N/A) INTRAOPERATIVE TRANSESOPHAGEAL ECHOCARDIOGRAM (N/A)   Looks great sitting up in chair NSR w/ stable hemodynamics O2 sats 95-97% UOP adequate  Plan: Continue routine post-op  [name] H 01/19/2014 7:16 PM

## 2014-01-19 NOTE — Transfer of Care (Signed)
Immediate Anesthesia Transfer of Care Note  Patient: [name]  Procedure(s) Performed: Procedure(s): TRANSCATHETER AORTIC VALVE REPLACEMENT, TRANSFEMORAL (N/A) INTRAOPERATIVE TRANSESOPHAGEAL ECHOCARDIOGRAM (N/A)  Patient Location: PACU and SICU  Anesthesia Type:General  Level of Consciousness: awake, alert , oriented and patient cooperative  Airway & Oxygen Therapy: Patient Spontanous Breathing and Patient connected to nasal cannula oxygen  Post-op Assessment: Report given to PACU RN, Post -op Vital signs reviewed and stable and Patient moving all extremities  Post vital signs: Reviewed and stable  Complications: No apparent anesthesia complications

## 2014-01-19 NOTE — Anesthesia Postprocedure Evaluation (Signed)
  Anesthesia Post-op Note  Patient: [name]  Procedure(s) Performed: Procedure(s): TRANSCATHETER AORTIC VALVE REPLACEMENT, TRANSFEMORAL (N/A) INTRAOPERATIVE TRANSESOPHAGEAL ECHOCARDIOGRAM (N/A)  Patient Location: SICU  Anesthesia Type:General  Level of Consciousness: awake, alert  and oriented  Airway and Oxygen Therapy: Patient Spontanous Breathing and Patient connected to nasal cannula oxygen  Post-op Pain: none  Post-op Assessment: Post-op Vital signs reviewed, Patient's Cardiovascular Status Stable, Respiratory Function Stable, Patent Airway and No signs of Nausea or vomiting  Post-op Vital Signs: stable  Last Vitals:  Filed Vitals:   01/19/14 1609  BP:   Pulse:   Temp: 36.7 C  Resp:     Complications: No apparent anesthesia complications

## 2014-01-19 NOTE — Op Note (Signed)
[facility]  TAVR OPERATIVE NOTE   Date of Procedure:  01/19/2014  Preoperative Diagnosis: Severe Aortic Stenosis   Postoperative Diagnosis: Same   Procedure:    Transcatheter Aortic Valve Replacement - Transfemoral Approach  Edwards Sapien 3 THV (size 23 mm, model # [id number], serial # [id number])   Co-Surgeons:  Valentina Gu. Roxy Manns, MD and Sherren Mocha, MD  Assistants:   Gaye Pollack, MD and Lauree Chandler, MD  Anesthesiologist:  Roberts Gaudy, MD  Echocardiographer:  Jenkins Rouge, MD  Pre-operative Echo Findings:  Severe aortic stenosis  Normal left ventricular systolic function  Mild MR  Post-operative Echo Findings:  No paravalvular leak  Normal left ventricular systolic function  Mild MR (unchanged)  BRIEF CLINICAL NOTE AND INDICATIONS FOR SURGERY  80 year-old woman with severe symptomatic AS presents for TAVR. She has been evaluated by the Multidisciplinary Heart Valve team including her primary cardiologist, interventional cardiologist, and 2 cardiac surgeons. After careful review of all studies and extensive discussion with the patient, we have elected to proceed with TAVR via a TF approach.  During the course of the patient's preoperative work up they have been evaluated comprehensively by a multidisciplinary team of specialists coordinated through the [facility] in the [facility].  They have been demonstrated to suffer from symptomatic severe aortic stenosis as noted above. The patient has been counseled extensively as to the relative risks and benefits of all options for the treatment of severe aortic stenosis including long term medical therapy, conventional surgery for aortic valve replacement, and transcatheter aortic valve replacement.  The patient has been independently evaluated by two cardiac surgeons including Dr Roxy Manns and Dr. Cyndia Bent, and they are felt to be at high risk for  conventional surgical aortic valve replacement based upon a predicted risk of mortality using the Society of Thoracic Surgeons risk calculator of 4.6%. Both surgeons indicated the patient would be a poor candidate for conventional surgery (predicted risk of mortality >15% and/or predicted risk of permanent morbidity >50%) because of comorbidities including advanced age, small aortic root/annulus which would potential require root enlargement.   Based upon review of all of the patient's preoperative diagnostic tests they are felt to be candidate for transcatheter aortic valve replacement using the transfemoral approach as an alternative to high risk conventional surgery.    Following the decision to proceed with transcatheter aortic valve replacement, a discussion has been held regarding what types of management strategies would be attempted intraoperatively in the event of life-threatening complications, including whether or not the patient would be considered a candidate for the use of cardiopulmonary bypass and/or conversion to open sternotomy for attempted surgical intervention.  The patient has been advised of a variety of complications that might develop peculiar to this approach including but not limited to risks of death, stroke, paravalvular leak, aortic dissection or other major vascular complications, aortic annulus rupture, device embolization, cardiac rupture or perforation, acute myocardial infarction, arrhythmia, heart block or bradycardia requiring permanent pacemaker placement, congestive heart failure, respiratory failure, renal failure, pneumonia, infection, other late complications related to structural valve deterioration or migration, or other complications that might ultimately cause a temporary or permanent loss of functional independence or other long term morbidity.  The patient provides full informed consent for the procedure as described and all questions were answered  preoperatively.    DETAILS OF THE OPERATIVE PROCEDURE  PREPARATION:    The patient is brought to the operating room on the  above mentioned date and central monitoring was established by the anesthesia team including placement of Swan-Ganz catheter and radial arterial line. The patient is placed in the supine position on the operating table.  Intravenous antibiotics are administered. General endotracheal anesthesia is induced uneventfully. A Foley catheter is placed.  Baseline transesophageal echocardiogram was performed. The patient's chest, abdomen, both groins, and both lower extremities are prepared and draped in a sterile manner. A time out procedure is performed.   PERIPHERAL ACCESS:    Using the modified Seldinger technique, femoral arterial and venous access was obtained with placement of 6 Fr sheaths on the left side.  A pigtail diagnostic catheter was passed through the left femoral arterial sheath under fluoroscopic guidance into the aortic root.  A temporary transvenous pacemaker catheter was passed through the left femoral venous sheath under fluoroscopic guidance into the right ventricle.  The pacemaker was tested to ensure stable lead placement and pacemaker capture. Aortic root angiography was performed in order to determine the optimal angiographic angle for valve deployment.   TRANSFEMORAL ACCESS:   A right femoral arterial cutdown was performed by Dr Roxy Manns. Please see his separate operative note for details. The patient was heparinized systemically and ACT verified > 250 seconds.    A 14 Fr transfemoral E-sheath was introduced into the right femoral artery after progressively dilating over an Amplatz superstiff wire. An AL-2 catheter was used to direct a straight-tip exchange length wire across the native aortic valve into the left ventricle. This was exchanged out for a pigtail catheter and position was confirmed in the LV apex. Simultaneous LV and Ao pressures were recorded.   The pigtail catheter was then exchanged for an Amplatz Extra-stiff wire in the LV apex. At that point, BAV was performed using a 20 mm valvuloplasty balloon.  Once optimal position was achieved, BAV was done under rapid ventricular pacing at 180 bpm. The patient recovered well hemodynamically.   TRANSCATHETER HEART VALVE DEPLOYMENT:  An Edwards Sapien 3 THV (size 23 mm) was prepared and crimped per manufacturer's guidelines, and the proper orientation of the valve is confirmed on the [employer] delivery system. The valve was advanced through the introducer sheath using normal technique until in an appropriate position in the abdominal aorta beyond the sheath tip. The balloon was then retracted and using the fine-tuning wheel was centered on the valve. The valve was then advanced across the aortic arch using appropriate flexion of the catheter. The valve was carefully positioned across the aortic valve annulus. The Commander catheter was retracted using normal technique. Once final position of the valve has been confirmed by angiographic assessment, the valve is deployed while temporarily holding ventilation and during rapid ventricular pacing to maintain systolic blood pressure < 50 mmHg and pulse pressure < 10 mmHg. The balloon inflation is held for >3 seconds after reaching full deployment volume. Once the balloon has fully deflated the balloon is retracted into the ascending aorta and valve function is assessed using TEE. There is felt to be no paravalvular leak and no central aortic insufficiency.  The patient's hemodynamic recovery following valve deployment is good.  The deployment balloon and guidewire are both removed. Echo demostrated acceptable post-procedural gradients, stable mitral valve function, and no AI.   PROCEDURE COMPLETION:  The sheath was then removed and arteriotomy repaired by Dr Roxy Manns. Please see his separate report for details. Distal abdominal aortography was performed to  evaluate for any arterial injury related to the procedure. There was no evidence dissection,  perforation, or other vascular injury in the abdominal aorta, iliac artery, or femoral artery.  Protamine was administered once femoral arterial repair was complete. The temporary pacemaker, pigtail catheters and femoral sheaths were removed with manual pressure used for hemostasis.   The patient tolerated the procedure well and is transported to the surgical intensive care in stable condition. There were no immediate intraoperative complications. All sponge instrument and needle counts are verified correct at completion of the operation.   No blood products were administered during the operation.  The patient received a total of 97 mL of intravenous contrast (Visipaque) during the procedure.  Sherren Mocha MD 01/19/2014 9:42 AM

## 2014-01-20 ENCOUNTER — Inpatient Hospital Stay (HOSPITAL_COMMUNITY): Payer: Medicare Other

## 2014-01-20 ENCOUNTER — Encounter (HOSPITAL_COMMUNITY): Payer: Self-pay | Admitting: Cardiovascular Disease

## 2014-01-20 DIAGNOSIS — I350 Nonrheumatic aortic (valve) stenosis: Secondary | ICD-10-CM

## 2014-01-20 DIAGNOSIS — R011 Cardiac murmur, unspecified: Secondary | ICD-10-CM

## 2014-01-20 DIAGNOSIS — I359 Nonrheumatic aortic valve disorder, unspecified: Secondary | ICD-10-CM

## 2014-01-20 DIAGNOSIS — Z954 Presence of other heart-valve replacement: Secondary | ICD-10-CM

## 2014-01-20 LAB — BASIC METABOLIC PANEL
Anion gap: 13 (ref 5–15)
BUN: 11 mg/dL (ref 6–23)
CALCIUM: 9 mg/dL (ref 8.4–10.5)
CO2: 22 meq/L (ref 19–32)
Chloride: 103 mEq/L (ref 96–112)
Creatinine, Ser: 0.84 mg/dL (ref 0.50–1.10)
GFR calc Af Amer: 71 mL/min — ABNORMAL LOW (ref >90)
GFR calc non Af Amer: 61 mL/min — ABNORMAL LOW (ref >90)
GLUCOSE: 95 mg/dL (ref 70–99)
Potassium: 3.7 mEq/L (ref 3.7–5.3)
SODIUM: 138 meq/L (ref 137–147)

## 2014-01-20 LAB — GLUCOSE, CAPILLARY
GLUCOSE-CAPILLARY: 84 mg/dL (ref 70–99)
GLUCOSE-CAPILLARY: 88 mg/dL (ref 70–99)
Glucose-Capillary: 103 mg/dL — ABNORMAL HIGH (ref 70–99)

## 2014-01-20 LAB — CBC
HEMATOCRIT: 31.2 % — AB (ref 36.0–46.0)
HEMOGLOBIN: 10.4 g/dL — AB (ref 12.0–15.0)
MCH: 29.3 pg (ref 26.0–34.0)
MCHC: 33.3 g/dL (ref 30.0–36.0)
MCV: 87.9 fL (ref 78.0–100.0)
Platelets: 236 10*3/uL (ref 150–400)
RBC: 3.55 MIL/uL — ABNORMAL LOW (ref 3.87–5.11)
RDW: 14.5 % (ref 11.5–15.5)
WBC: 6.9 10*3/uL (ref 4.0–10.5)

## 2014-01-20 LAB — MAGNESIUM: MAGNESIUM: 1.6 mg/dL (ref 1.5–2.5)

## 2014-01-20 MED ORDER — ASPIRIN 81 MG PO CHEW
81.0000 mg | CHEWABLE_TABLET | Freq: Every day | ORAL | Status: DC
  Administered 2014-01-21: 81 mg via ORAL
  Filled 2014-01-20 (×2): qty 1

## 2014-01-20 MED ORDER — AMLODIPINE BESYLATE 10 MG PO TABS
10.0000 mg | ORAL_TABLET | Freq: Every day | ORAL | Status: DC
  Administered 2014-01-20 – 2014-01-22 (×3): 10 mg via ORAL
  Filled 2014-01-20 (×4): qty 1

## 2014-01-20 MED ORDER — POTASSIUM CHLORIDE CRYS ER 20 MEQ PO TBCR
20.0000 meq | EXTENDED_RELEASE_TABLET | ORAL | Status: DC | PRN
  Administered 2014-01-20: 20 meq via ORAL
  Filled 2014-01-20: qty 1

## 2014-01-20 MED FILL — Heparin Sodium (Porcine) Inj 1000 Unit/ML: INTRAMUSCULAR | Qty: 30 | Status: AC

## 2014-01-20 MED FILL — Norepinephrine Bitartrate IV Soln 1 MG/ML (Base Equivalent): INTRAVENOUS | Qty: 4 | Status: AC

## 2014-01-20 MED FILL — Potassium Chloride Inj 2 mEq/ML: INTRAVENOUS | Qty: 40 | Status: AC

## 2014-01-20 MED FILL — Dextrose Inj 5%: INTRAVENOUS | Qty: 250 | Status: AC

## 2014-01-20 MED FILL — Magnesium Sulfate Inj 50%: INTRAMUSCULAR | Qty: 10 | Status: AC

## 2014-01-20 NOTE — Progress Notes (Signed)
Echo Lab  2D Echocardiogram completed.  [place], RDCS 01/20/2014 10:55 AM

## 2014-01-20 NOTE — Progress Notes (Signed)
    Subjective:  No CP or dyspnea. Tired this am. Didn't sleep well last night.   Objective:  Vital Signs in the last 24 hours: Temp:  [96.8 F (36 C)-98.2 F (36.8 C)] 98 F (36.7 C) (12/02 0359) Pulse Rate:  [49-100] 69 (12/02 0700) Resp:  [16-28] 20 (12/02 0700) BP: (108-151)/(32-86) 151/40 mmHg (12/02 0700) SpO2:  [90 %-97 %] 90 % (12/02 0700) Arterial Line BP: (115-143)/(41-52) 143/50 mmHg (12/01 1600) Weight:  [148 lb 3.2 oz (67.223 kg)] 148 lb 3.2 oz (67.223 kg) (12/02 0500)  Intake/Output from previous day: 12/01 0701 - 12/02 0700 In: 2809.8 [I.V.:1724.8; Blood:335; IV Piggyback:750] Out: 2860 [Urine:2660; Blood:200]  Physical Exam: Pt is alert and oriented, sitting up in chair, NAD HEENT: normal Neck: JVP - normal Lungs: CTA bilaterally CV: RRR without murmur or gallop Abd: soft, NT, Positive BS, no hepatomegaly Ext: no C/C/E, distal pulses intact and equal, bilateral groin sites clear except for mild ecchymoses. No hematoma. Skin: warm/dry no rash   Lab Results:  Recent Labs  01/19/14 1045 01/19/14 1058 01/20/14 0318  WBC 6.2  --  6.9  HGB 10.4* 12.9 10.4*  PLT 234  --  236    Recent Labs  01/19/14 0943 01/19/14 1058 01/20/14 0318  NA 137 136* 138  K 3.6* 3.5* 3.7  CL 100  --  103  CO2  --   --  22  GLUCOSE 130* 103* 95  BUN 15  --  11  CREATININE 0.90  --  0.84   No results for input(s): TROPONINI in the last 72 hours.  Invalid input(s): CK, MB  Cardiac Studies: 2D Echo pending  Tele: Personally reviewed: sinus rhythm without significant arrhythmia  Assessment/Plan:  1. Severe aortic stenosis s/p TAVR POD #1 2. Acute on chronic diastolic CHF, NYHA Class 3, euvolemic on exam after TAVR 3. Post-op blood loss anemia, mild, expected 4. HTN, essential: controlled. Resume home meds this am  Dispo: 2D echo today, resume home antihypertensive meds, tx tele bed, Phase 1 cardiac rehab.   Sherren Mocha, M.D. 01/20/2014, 7:49 AM

## 2014-01-20 NOTE — Addendum Note (Signed)
Addendum  created 01/20/14 2114 by Roberts Gaudy, MD   Modules edited: Notes Section   Notes Section:  File: [id number]; Pend: [id number]; Pend: [id number]

## 2014-01-20 NOTE — Progress Notes (Addendum)
Per Dr Burt Knack pt does not need to be a cbg check, no order to discontinue but still ordered on worklist, will pass on to oncoming staff about changes. Sherrie Mustache. 12:13 PM

## 2014-01-20 NOTE — Progress Notes (Signed)
CARDIAC REHAB PHASE I   PRE:  Rate/Rhythm: 77 SR    BP: sitting 140/70    SaO2: 93 RA  MODE:  Ambulation: 550 ft   POST:  Rate/Rhythm: 97 SR    BP: sitting 140/64     SaO2: 93 RA  Pt apparently walked >1000 ft earlier today. Sts her knee is feeling better than normal. Able to get out of bed independently. With standing a bit unsteady due to knee pain. Once holding RW able to walk without problems. Declined complaints. Return to bed after walk. PT to eval. [id number]   [name] CES, ACSM 01/20/2014 3:27 PM

## 2014-01-20 NOTE — Progress Notes (Addendum)
Report called to Marletta Lor RN. Pt transferred to 2W-01 via ambulation. Family aware of room number, meds in chart, belongings at bedside. VS stable at time of transfer. No c/o pain. No current questions or complaints at this time. Bedside handoff to LB, 2W RN.   Fareed Fung L

## 2014-01-20 NOTE — Progress Notes (Signed)
Anesthesiology Follow-up:  Awake and alert, in good spirits, walking with assistance. Moved to 2W today.  VS: T-36.7 BP- 145/56 HR- 76 (SR) RR- 12 O2 Sat 95% on RA  K- 3.7 Na- 138 BUN/Cr. 11/0.84 glucose 95 H/H: 10.4/31.2 Platelets 236,000  Aortic valve looks good on ECHO without AI, mean gradient 14 mm hg.  POD #1 following TAVR. No apparent complications.

## 2014-01-20 NOTE — Evaluation (Signed)
Physical Therapy Evaluation and Discharge Patient Details Name: [name] MRN: [id number] DOB: 1925-04-12 Today's Date: 01/20/2014   History of Present Illness  80 y.o. female s/p TRANSCATHETER AORTIC VALVE REPLACEMENT, TRANSFEMORAL.  Clinical Impression  Patient evaluated by Physical Therapy with no further acute PT needs identified. All education has been completed and the patient has no further questions. Ambulates generally well up to 600 feet this afternoon. Mildly antalgic due to Rt knee pain, improved stability with a rolling walker. Recommend SNF due to recent surgery with no assistance available at home, and continue with cardiac rehabilitation by physical therapy. See below for any follow-up Physial Therapy or equipment needs. PT is signing off. Thank you for this referral.     Follow Up Recommendations SNF    Equipment Recommendations  Rolling walker with 5" wheels    Recommendations for Other Services       Precautions / Restrictions Precautions Precautions: Fall Restrictions Weight Bearing Restrictions: No      Mobility  Bed Mobility Overal bed mobility: Modified Independent                Transfers Overall transfer level: Needs assistance Equipment used: None Transfers: Sit to/from Stand Sit to Stand: Supervision         General transfer comment: Supervision for safety. Safely places hands on stable surface to rise. Mildly off balance initially, reaching for furniture for balance.  Ambulation/Gait Ambulation/Gait assistance: Supervision Ambulation Distance (Feet): 600 Feet Assistive device: Rolling walker (2 wheeled);None Gait Pattern/deviations: Step-through pattern;Decreased stride length;Antalgic;Decreased step length - left;Decreased stance time - right   Gait velocity interpretation: at or above normal speed for age/gender General Gait Details: Supervision for safety,  Moderately antalgic gait on Rt without an assistive device, improves  with use of a rolling walker. Good stability while ambulating. Mildy dyspneic with SPO2 93% and greater.  Stairs            Wheelchair Mobility    Modified Rankin (Stroke Patients Only)       Balance Overall balance assessment: Needs assistance Sitting-balance support: No upper extremity supported;Feet supported Sitting balance-Leahy Scale: Good     Standing balance support: No upper extremity supported Standing balance-Leahy Scale: Fair                               Pertinent Vitals/Pain Pain Assessment: 0-10 Pain Score: 4  Pain Location: Rt groin Pain Descriptors / Indicators: Sore ("pulling") Pain Intervention(s): Monitored during session;Repositioned    Home Living Family/patient expects to be discharged to:: Skilled nursing facility Living Arrangements: Alone Available Help at Discharge:  (None) Type of Home: Apartment Home Access: Level entry     Home Layout: One level Home Equipment: Tub bench      Prior Function Level of Independence: Independent               Hand Dominance   Dominant Hand: Right    Extremity/Trunk Assessment   Upper Extremity Assessment: Defer to OT evaluation           Lower Extremity Assessment: Generalized weakness         Communication   Communication: No difficulties  Cognition Arousal/Alertness: Awake/alert Behavior During Therapy: WFL for tasks assessed/performed Overall Cognitive Status: Within Functional Limits for tasks assessed                      General Comments  Exercises General Exercises - Lower Extremity Ankle Circles/Pumps: AROM;Both;10 reps;Seated Quad Sets: Strengthening;Both;10 reps;Seated Long Arc Quad: Strengthening;Both;10 reps;Seated Hip Flexion/Marching: Strengthening;Both;10 reps;Seated      Assessment/Plan    PT Assessment All further PT needs can be met in the next venue of care  PT Diagnosis Abnormality of gait;Acute pain;Generalized  weakness;Difficulty walking   PT Problem List Decreased strength;Decreased range of motion;Decreased activity tolerance;Decreased balance;Decreased mobility;Pain  PT Treatment Interventions DME instruction;Gait training;Stair training;Functional mobility training;Therapeutic activities;Therapeutic exercise;Neuromuscular re-education;Balance training;Patient/family education;Modalities   PT Goals (Current goals can be found in the Care Plan section) Acute Rehab PT Goals Patient Stated Goal: Go home PT Goal Formulation: With patient    Frequency     Barriers to discharge Decreased caregiver support Lives alone    Co-evaluation               End of Session Equipment Utilized During Treatment: Gait belt Activity Tolerance: Patient tolerated treatment well Patient left: in chair;with call bell/phone within reach Nurse Communication: Mobility status         Time: 7185-5015 PT Time Calculation (min) (ACUTE ONLY): 26 min   Charges:   PT Evaluation $Initial PT Evaluation Tier I: 1 Procedure PT Treatments $Gait Training: 8-22 mins   PT G CodesEllouise Newer 01/20/2014, 6:05 PM  Camille Bal [place], [place]

## 2014-01-20 NOTE — Progress Notes (Signed)
      [address]       [place] [address]             [phone]        CARDIOTHORACIC SURGERY PROGRESS NOTE   R1 Day Post-Op Procedure(s) (LRB): TRANSCATHETER AORTIC VALVE REPLACEMENT, TRANSFEMORAL (N/A) INTRAOPERATIVE TRANSESOPHAGEAL ECHOCARDIOGRAM (N/A)  Subjective: Looks great.  Denies pain other than chronic knee pain..  Objective: Vital signs: BP Readings from Last 1 Encounters:  01/20/14 146/50   Pulse Readings from Last 1 Encounters:  01/20/14 72   Resp Readings from Last 1 Encounters:  01/20/14 15   Temp Readings from Last 1 Encounters:  01/20/14 98.1 F (36.7 C) Oral    Hemodynamics: PAP: (39-44)/(17-23) 43/21 mmHg CO:  [5.2 L/min-6.7 L/min] 5.6 L/min CI:  [3.1 L/min/m2-3.4 L/min/m2] 3.4 L/min/m2  Physical Exam:  Rhythm:   sinus  Breath sounds: clear  Heart sounds:  RRR  Incisions:  Clean and dry  Abdomen:  Soft, non-distended, non-tender  Extremities:  Warm, well-perfused    Intake/Output from previous day: 12/01 0701 - 12/02 0700 In: 2809.8 [I.V.:1724.8; Blood:335; IV Piggyback:750] Out: 2910 [Urine:2710; Blood:200] Intake/Output this shift: Total I/O In: 30 [I.V.:30] Out: 100 [Urine:100]  Lab Results:  CBC: Recent Labs  01/19/14 1045 01/19/14 1058 01/20/14 0318  WBC 6.2  --  6.9  HGB 10.4* 12.9 10.4*  HCT 31.1* 38.0 31.2*  PLT 234  --  236    BMET:  Recent Labs  01/19/14 0943 01/19/14 1058 01/20/14 0318  NA 137 136* 138  K 3.6* 3.5* 3.7  CL 100  --  103  CO2  --   --  22  GLUCOSE 130* 103* 95  BUN 15  --  11  CREATININE 0.90  --  0.84  CALCIUM  --   --  9.0     CBG (last 3)   Recent Labs  01/19/14 2339 01/20/14 0749 01/20/14 1139  GLUCAP 83 88 103*    ABG    Component Value Date/Time   PHART 7.363 01/19/2014 1046   PCO2ART 44.3 01/19/2014 1046   PO2ART 84.0 01/19/2014 1046   HCO3 25.5* 01/19/2014 1046   TCO2 27 01/19/2014 1046   O2SAT 96.0 01/19/2014 1046    CXR: PORTABLE CHEST  - 1 VIEW  COMPARISON: 01/19/2014  FINDINGS: Swan-Ganz catheter has been removed. Negative for heart failure or edema. Mild atelectasis in the lung bases. No significant effusion.  IMPRESSION: Mild bibasilar atelectasis.   Electronically Signed  By: Franchot Gallo M.D.  On: 01/20/2014 07:48   Assessment/Plan: S/P Procedure(s) (LRB): TRANSCATHETER AORTIC VALVE REPLACEMENT, TRANSFEMORAL (N/A) INTRAOPERATIVE TRANSESOPHAGEAL ECHOCARDIOGRAM (N/A)  Doing very well POD1 Expected post op acute blood loss anemia, mild, stable Expected post op atelectasis, mild Severe degenerative arthritis w/ chronic knee pain   Agree w/ plan per Dr Lenise Arena  PT consult - patient likely will need short term placement for recovery because of her mobility issues and lack of continuous support at home   [facility] 01/20/2014

## 2014-01-21 DIAGNOSIS — I350 Nonrheumatic aortic (valve) stenosis: Principal | ICD-10-CM

## 2014-01-21 NOTE — Plan of Care (Signed)
Problem: Phase II - Intermediate Post-Op Goal: Advance Diet Outcome: Completed/Met Date Met:  01/21/14     

## 2014-01-21 NOTE — Clinical Social Work Psychosocial (Signed)
Clinical Social Work Department BRIEF PSYCHOSOCIAL ASSESSMENT 01/21/2014  Patient:  [name], [name]     Account Number:  [id number]     Admit date:  01/19/2014  Clinical Social Worker:  Dian Queen  Date/Time:  01/21/2014 04:38 PM  Referred by:  Physician  Date Referred:  01/21/2014 Referred for  SNF Placement   Other Referral:   Interview type:  Patient Other interview type:   family    PSYCHOSOCIAL DATA Living Status:  ALONE Admitted from facility:   Level of care:   Primary support name:  Erskine Squibb Primary support relationship to patient:  FAMILY Degree of support available:   Patient has a large family and has lots of support from her kids and grandkids.    CURRENT CONCERNS Current Concerns  Post-Acute Placement   Other Concerns:    SOCIAL WORK ASSESSMENT / PLAN Patient is a 80 year old female who lives on her own. Patient is alert and oriented x3, pleasant, and talkative. Patient has family at bedside and are very supportive of her.  Patient would like to go home, but understands she needs to go somewhere for short term rehab to get her strength back.  Patient and family were agreeable to going to SNF for rehab.  Patient and family would like to go to [facility] if beds are available.  Patient will be discharged once medically ready and discharge orders are given.   Assessment/plan status:   Other assessment/ plan:   Information/referral to community resources:    PATIENT'S/FAMILY'S RESPONSE TO PLAN OF CARE: Patient and family agreeable to going to SNF for short term rehab.    Jones Broom. [place], MSW, [place] 01/21/2014 4:43 PM

## 2014-01-21 NOTE — Progress Notes (Signed)
CARDIAC REHAB PHASE I   PRE:  Rate/Rhythm: 86 SR    BP: sitting 140/72    SaO2: 95 RA  MODE:  Ambulation: 890 ft   POST:  Rate/Rhythm: 106 ST    BP: sitting 140/72     SaO2: 95 RA  Pt moving very well. Able to stand independently and walk without RW. No assist needed. Feels well, no c/o for 890 ft. Some SOB with talking. I asked her to rest x1. Discussed groin restrictions and IS. [id number]   Josephina Shih [place] CES, ACSM 01/21/2014 9:28 AM

## 2014-01-21 NOTE — Progress Notes (Signed)
      [address]       [place] [address]             [phone]     CARDIOTHORACIC SURGERY PROGRESS NOTE  2 Days Post-Op  S/P Procedure(s) (LRB): TRANSCATHETER AORTIC VALVE REPLACEMENT, TRANSFEMORAL (N/A) INTRAOPERATIVE TRANSESOPHAGEAL ECHOCARDIOGRAM (N/A)  Subjective: Patient doing well. Only complaint is knee pain secondary to chronic condition. She also reports her IV fell out in middle of night, and she awoke to blood soaked sheets.   Objective: Vital signs in last 24 hours: Temp:  [98 F (36.7 C)-98.3 F (36.8 C)] 98.2 F (36.8 C) (12/03 0435) Pulse Rate:  [72-80] 79 (12/03 0435) Cardiac Rhythm:  [-] Normal sinus rhythm (12/02 2020) Resp:  [12-23] 18 (12/03 0435) BP: (134-156)/(45-116) 156/59 mmHg (12/03 0435) SpO2:  [94 %-98 %] 95 % (12/03 0435) Weight:  [147 lb 4.8 oz (66.815 kg)] 147 lb 4.8 oz (66.815 kg) (12/03 0435)  Physical Exam:  Rhythm:   NSR  Breath sounds: Clear to auscultation bilaterally   Heart sounds:  RRR  Incisions:  Clean and dry  Abdomen:  Soft, non tender, non distended  Extremities:  Warm, well perfused   Intake/Output from previous day: 12/02 0701 - 12/03 0700 In: 390 [P.O.:360; I.V.:30] Out: 500 [Urine:500] Intake/Output this shift:    Lab Results:  Recent Labs  01/19/14 1045 01/19/14 1058 01/20/14 0318  WBC 6.2  --  6.9  HGB 10.4* 12.9 10.4*  HCT 31.1* 38.0 31.2*  PLT 234  --  236   BMET:  Recent Labs  01/19/14 0943 01/19/14 1058 01/20/14 0318  NA 137 136* 138  K 3.6* 3.5* 3.7  CL 100  --  103  CO2  --   --  22  GLUCOSE 130* 103* 95  BUN 15  --  11  CREATININE 0.90  --  0.84  CALCIUM  --   --  9.0    CBG (last 3)   Recent Labs  01/20/14 0337 01/20/14 0749 01/20/14 1139  GLUCAP 84 88 103*   PT/INR:   Recent Labs  01/19/14 1045  LABPROT 15.7*  INR 1.24    CXR:  N/A  Assessment/Plan: S/P Procedure(s) (LRB): TRANSCATHETER AORTIC VALVE REPLACEMENT, TRANSFEMORAL  (N/A) INTRAOPERATIVE TRANSESOPHAGEAL ECHOCARDIOGRAM (N/A)  Patient progressing very well. POD2.  Post op blood loss anemia, mild, stable, Hgb 10.4 on 12/2 Severe degenerative arthritis with chronic knee pain PT consulted yesterday, CM expected to see patient today for SNF placement for short rehab stay. Discharge anticipated tomorrow.   Waylan Rocher, PA-S2 01/21/2014 8:04 AM

## 2014-01-21 NOTE — Plan of Care (Signed)
Problem: Phase II - Intermediate Post-Op Goal: Pain controlled with appropriate interventions Outcome: Completed/Met Date Met:  01/21/14

## 2014-01-21 NOTE — Clinical Social Work Placement (Signed)
Clinical Social Work Department CLINICAL SOCIAL WORK PLACEMENT NOTE 01/21/2014  Patient:  ALAYSA, DIGILIO  Account Number:  [id number] Admit date:  01/19/2014  Clinical Social Worker:  Tranise Forrest, LCSWA  Date/time:  01/21/2014 04:44 PM  Clinical Social Work is seeking post-discharge placement for this patient at the following level of care:   SKILLED NURSING   (*CSW will update this form in Epic as items are completed)   01/21/2014  Patient/family provided with [place] Department of Clinical Social Work's list of facilities offering this level of care within the geographic area requested by the patient (or if unable, by the patient's family).  01/21/2014  Patient/family informed of their freedom to choose among providers that offer the needed level of care, that participate in Medicare, Medicaid or managed care program needed by the patient, have an available bed and are willing to accept the patient.  01/21/2014  Patient/family informed of [facility]' ownership interest in [facility], as well as of the fact that they are under no obligation to receive care at this facility.  PASARR submitted to EDS on 01/21/2014 PASARR number received on 01/21/2014  FL2 transmitted to all facilities in geographic area requested by pt/family on  01/21/2014 FL2 transmitted to all facilities within larger geographic area on 01/21/2014  Patient informed that his/her managed care company has contracts with or will negotiate with  certain facilities, including the following:     Patient/family informed of bed offers received:  01/21/2014 Patient chooses bed at [facility] Physician recommends and patient chooses bed at    Patient to be transferred to  on   Patient to be transferred to facility by  Patient and family notified of transfer on  Name of family member notified:    The following physician request were entered in Epic:   Additional Comments: Physician please sign FL2  on Chart  Rut [place] R. [place], MSW, [place] 01/21/2014 4:45 PM

## 2014-01-21 NOTE — Progress Notes (Signed)
     SUBJECTIVE: Feels great. No chest pain or SOB  BP 156/59 mmHg  Pulse 79  Temp(Src) 98.2 F (36.8 C) (Oral)  Resp 18  Ht 5\' 4"  (1.626 m)  Wt 147 lb 4.8 oz (66.815 kg)  BMI 25.27 kg/m2  SpO2 95%  Intake/Output Summary (Last 24 hours) at 01/21/14 0719 Last data filed at 01/20/14 1700  Gross per 24 hour  Intake    390 ml  Output    500 ml  Net   -110 ml    PHYSICAL EXAM General: Well developed, well nourished, in no acute distress. Alert and oriented x 3.  Psych:  Good affect, responds appropriately Neck: No JVD. No masses noted.  Lungs: Clear bilaterally with no wheezes or rhonci noted.  Heart: RRR with systolic murmur noted. Abdomen: Bowel sounds are present. Soft, non-tender.  Extremities: No lower extremity edema.   LABS: Basic Metabolic Panel:  Recent Labs  01/19/14 0943 01/19/14 1058 01/20/14 0318  NA 137 136* 138  K 3.6* 3.5* 3.7  CL 100  --  103  CO2  --   --  22  GLUCOSE 130* 103* 95  BUN 15  --  11  CREATININE 0.90  --  0.84  CALCIUM  --   --  9.0  MG  --   --  1.6   CBC:  Recent Labs  01/19/14 1045 01/19/14 1058 01/20/14 0318  WBC 6.2  --  6.9  HGB 10.4* 12.9 10.4*  HCT 31.1* 38.0 31.2*  MCV 87.6  --  87.9  PLT 234  --  236   Current Meds: . acetaminophen  1,000 mg Oral 4 times per day  . amLODipine  10 mg Oral Daily  . aspirin  81 mg Oral Daily  . carvedilol  12.5 mg Oral BID  . clopidogrel  75 mg Oral Q breakfast  . pantoprazole  40 mg Oral Daily  . sodium chloride  3 mL Intravenous Q12H   Echo 01/20/14:  Left ventricle: The cavity size was normal. The estimated ejection fraction was 65%. Wall motion was normal; there were no regional wall motion abnormalities. - Aortic valve: The TAVR prosthesis is working well. There was no significant regurgitation.  ASSESSMENT AND PLAN:  1. Severe aortic stenosis:  s/p TAVR POD #2. Doing well. Echo 01/20/14 with normal functioning aortic valve prosthesis. Continue ASA and Plavix.      2. Acute on chronic diastolic CHF, NYHA Class 3: Volume status is stable.   3. Post-op blood loss anemia: Stable.   4. HTN, essential: controlled. No changes.   Dispo: Ready for d/c but awaiting SNF bed. Appreciate PT consult.   Glenda Kunst  12/3/20157:19 AM

## 2014-01-21 NOTE — Plan of Care (Signed)
Problem: Phase II - Intermediate Post-Op Goal: Activity Progressed Outcome: Completed/Met Date Met:  01/21/14

## 2014-01-22 LAB — TYPE AND SCREEN
ABO/RH(D): O POS
ANTIBODY SCREEN: NEGATIVE
UNIT DIVISION: 0
Unit division: 0

## 2014-01-22 MED ORDER — HYDROCHLOROTHIAZIDE 25 MG PO TABS
25.0000 mg | ORAL_TABLET | Freq: Every day | ORAL | Status: DC
  Administered 2014-01-22: 25 mg via ORAL
  Filled 2014-01-22: qty 1

## 2014-01-22 MED ORDER — CLOPIDOGREL BISULFATE 75 MG PO TABS: 75.0000 mg | ORAL_TABLET | Freq: Every day | ORAL | Status: DC

## 2014-01-22 MED ORDER — ACETAMINOPHEN 500 MG PO TABS: 1000.0000 mg | ORAL_TABLET | Freq: Four times a day (QID) | ORAL | Status: DC

## 2014-01-22 MED ORDER — ASPIRIN 81 MG PO CHEW: 81.0000 mg | CHEWABLE_TABLET | Freq: Every day | ORAL | Status: DC

## 2014-01-22 NOTE — Progress Notes (Signed)
    Subjective:  No chest pain or shortness of breath. Feeling well. Walked yesterday without cardiopulmonary symptoms.   Objective:  Vital Signs in the last 24 hours: Temp:  [98.1 F (36.7 C)-98.2 F (36.8 C)] 98.1 F (36.7 C) (12/04 0510) Pulse Rate:  [78-90] 78 (12/04 0510) Resp:  [18] 18 (12/04 0510) BP: (145-157)/(48-65) 156/48 mmHg (12/04 0510) SpO2:  [94 %-96 %] 94 % (12/04 0510) Weight:  [145 lb 8.1 oz (66 kg)] 145 lb 8.1 oz (66 kg) (12/04 0521)  Intake/Output from previous day: 12/03 0701 - 12/04 0700 In: 960 [P.O.:960] Out: 500 [Urine:500]  Physical Exam: Pt is alert and oriented, elderly woman in NAD HEENT: normal Neck: JVP - normal Lungs: CTA bilaterally CV: RRR without murmur or gallop Abd: soft, NT, Positive BS, no hepatomegaly Ext: no C/C/E, distal pulses intact and equal, bilateral groin sites clear with mild ecchymoses Skin: warm/dry no rash   Lab Results:  Recent Labs  01/19/14 1045 01/19/14 1058 01/20/14 0318  WBC 6.2  --  6.9  HGB 10.4* 12.9 10.4*  PLT 234  --  236    Recent Labs  01/19/14 0943 01/19/14 1058 01/20/14 0318  NA 137 136* 138  K 3.6* 3.5* 3.7  CL 100  --  103  CO2  --   --  22  GLUCOSE 130* 103* 95  BUN 15  --  11  CREATININE 0.90  --  0.84   No results for input(s): TROPONINI in the last 72 hours.  Invalid input(s): CK, MB  Cardiac Studies: 2D Echo: Study Conclusions  - Left ventricle: The cavity size was normal. The estimated ejection fraction was 65%. Wall motion was normal; there were no regional wall motion abnormalities. - Aortic valve: The TAVR prosthesis is working well. There was no significant regurgitation.  Tele: Sinus rhythm, personally reviewed.  Assessment/Plan:  1. Severe aortic stenosis s/p TAVR POD #3 2. Acute on chronic diastolic CHF, improved after tx of aortic stenosis 3. Post-op blood loss anemia, mild 4. Essential HTN, with mild systolic HTN in hospital. Will resume HCTZ at  discharge. Continue amlodipine and carvedilol at current doses.   Dispo: Echo reviewed with normal valve function. Pt progressing well. To ST-SNF today. Will arrange PA/NP visit in office in 2 weeks then 30 day Valve Clinic follow-up with echo. Continue ASA/Plavix and other home meds.   Sherren Mocha, M.D. 01/22/2014, 7:41 AM

## 2014-01-22 NOTE — Plan of Care (Signed)
Problem: Surgery Discharge Goal: Pain controlled with appropriate interventions Outcome: Completed/Met Date Met:  01/22/14

## 2014-01-22 NOTE — Plan of Care (Signed)
Problem: Surgery Discharge Goal: Tolerating diet Outcome: Completed/Met Date Met:  01/22/14

## 2014-01-22 NOTE — Discharge Summary (Signed)
CARDIOLOGY DISCHARGE SUMMARY   Patient ID: GINNETTE BERARDINO MRN: [id number] DOB/AGE: [birth year] 78 y.o.  Admit date: 01/19/2014 Discharge date: 01/22/2014  PCP: Ky Barban, MD Primary Cardiologist: Dr. Alla German  Primary Discharge Diagnosis:  S/P TAVR (transcatheter aortic valve replacement) Secondary Discharge Diagnosis:    Severe aortic valve stenosis   Chronic diastolic CHF - weight [id number] pounds at discharge   Hypertension   Osteoarthritis  Consults: TCTS  Procedures: Transcatheter Aortic Valve Replacement - Open Right Transfemoral ApproachEdwards Sapien 3 Transcatheter Heart Valve (size 23 mm, model # 9600TFX, serial # [id number])  Transesophageal echocardiogram intraoperative  2-D echocardiogram postprocedure  Hospital Course: GAONOU HELING is a 80 y.o. female with no previous history of CAD. She had an echocardiogram prior to planned knee replacement and it showed severe aortic stenosis. Dr. Sallyanne Kuster did a right and left heart catheterization which showed no significant CAD but some various. She was evaluated in the TAVR clinic and felt appropriate for the procedure. She came to the hospital for TAVR on 12/01.  She tolerated the procedure well. She was followed both by the surgical team and the cardiology team during her hospital stay. Because of her history of heart failure, her volume status was monitored closely during her hospital stay. Her admission weight and her discharge weight were the same.  Blood pressure was monitored closely during her hospital stay. Her HCTZ had been held but will be resumed at discharge.   She was seen by cardiac rehabilitation and ambulated with them. However, she continued to have problems with chronic knee pain. Skilled nursing placement was recommended, to facilitate continued recovery. She selected [facility] and will go there upon discharge.  On 12/04, she was seen by Dr. Burt Knack and by the surgical team. All data were  reviewed. No further inpatient workup was indicated and she is considered stable for discharge, to follow up as an outpatient  Labs:   Lab Results  Component Value Date   WBC 6.9 01/20/2014   HGB 10.4* 01/20/2014   HCT 31.2* 01/20/2014   MCV 87.9 01/20/2014   PLT 236 01/20/2014    Recent Labs Lab 01/15/14 1236  01/20/14 0318  NA 137  < > 138  K 4.0  < > 3.7  CL 98  < > 103  CO2 24  --  22  BUN 18  < > 11  CREATININE 0.97  < > 0.84  CALCIUM 10.0  --  9.0  PROT 8.2  --   --   BILITOT 0.3  --   --   ALKPHOS 56  --   --   ALT 17  --   --   AST 17  --   --   GLUCOSE 156*  < > 95  < > = values in this interval not displayed.    Radiology:  Dg Chest Port 1 View 01/20/2014   CLINICAL DATA:  Transcatheter aortic valve replacement  EXAM: PORTABLE CHEST - 1 VIEW  COMPARISON:  01/19/2014  FINDINGS: Swan-Ganz catheter has been removed. Negative for heart failure or edema. Mild atelectasis in the lung bases. No significant effusion.  IMPRESSION: Mild bibasilar atelectasis.   Electronically Signed   By: Franchot Gallo M.D.   On: 01/20/2014 07:48   Dg Chest Port 1 View 01/19/2014   CLINICAL DATA:  Aortic valve replacement.  EXAM: PORTABLE CHEST - 1 VIEW  COMPARISON:  01/15/2014  FINDINGS: Heart size is normal. Pulmonary vascularity is at the  upper limits of normal. Prosthetic aortic valve in place. Swan-Ganz catheter tip is in the right main pulmonary artery. Minimal atelectasis at the left lung base. No pneumothorax or pleural effusion.  IMPRESSION: No acute abnormalities other than slight atelectasis at the left lung base.   Electronically Signed   By: Rozetta Nunnery M.D.   On: 01/19/2014 11:25   Echo: 01/20/2014 Conclusions - Left ventricle: The cavity size was normal. The estimated ejection fraction was 65%. Wall motion was normal; there were no regional wall motion abnormalities. - Aortic valve: The TAVR prosthesis is working well. There was no significant regurgitation.  FOLLOW  UP PLANS AND APPOINTMENTS Allergies  Allergen Reactions  . Aspirin Nausea And Vomiting  . Codeine Nausea And Vomiting     Medication List    TAKE these medications        acetaminophen 500 MG tablet  Commonly known as:  TYLENOL  Take 2 tablets (1,000 mg total) by mouth every 6 (six) hours.     ALEVE 220 MG tablet  Generic drug:  naproxen sodium  Take 220 mg by mouth 2 (two) times daily as needed (pain/headache).     amLODipine 10 MG tablet  Commonly known as:  NORVASC  Take 10 mg by mouth daily.     aspirin 81 MG chewable tablet  Chew 1 tablet (81 mg total) by mouth daily.     carvedilol 12.5 MG tablet  Commonly known as:  COREG  Take 12.5 mg by mouth 2 (two) times daily.     cholecalciferol 1000 UNITS tablet  Commonly known as:  VITAMIN D  Take 1,000 Units by mouth 2 (two) times daily.     clopidogrel 75 MG tablet  Commonly known as:  PLAVIX  Take 1 tablet (75 mg total) by mouth daily with breakfast.     fenofibrate micronized 200 MG capsule  Commonly known as:  LOFIBRA  Take 200 mg by mouth daily.     fish oil-omega-3 fatty acids 1000 MG capsule  Take 1 g by mouth daily. With 300 mg of Omega 3     fluticasone 50 MCG/ACT nasal spray  Commonly known as:  FLONASE  Place 1 spray into both nostrils 2 (two) times daily as needed (congestion).     hydrochlorothiazide 25 MG tablet  Commonly known as:  HYDRODIURIL  Take 25 mg by mouth daily.     omeprazole 40 MG capsule  Commonly known as:  PRILOSEC  Take 40 mg by mouth daily.        Discharge Instructions    Diet - low sodium heart healthy    Complete by:  As directed      Increase activity slowly    Complete by:  As directed           Follow-up Information    Follow up with [facility].   Why:  The office will call   Contact information:   [place] [phone]       Follow up with Sherren Mocha, MD On 03/16/2014.   Specialty:  Cardiology   Why:   Echocardiogram at 10:30 AM, M.D. visit at 11:30 AM   Contact information:   [id number] [place] [address] [phone]       BRING ALL MEDICATIONS WITH YOU TO FOLLOW UP APPOINTMENTS  Time spent with patient to include physician time: 46 min Signed: Rosaria Ferries, PA-C 01/22/2014, 12:00 PM Co-Sign MD

## 2014-01-22 NOTE — Progress Notes (Signed)
CARDIAC REHAB PHASE I   PRE:  Rate/Rhythm: 83 SR  BP:  Supine:   Sitting: 110/64  Standing:    SaO2: 96 RA  MODE:  Ambulation: 890 ft   POST:  Rate/Rhythm: 90  BP:  Supine:   Sitting: 108/60  Standing:    SaO2: 96 RA 1105-1135  Assisted X 1 and used walker to ambulate. Gait steady with walker. Pt able to walk 890 feet without c/o of pain or SOB. VS stable. Pt back to recliner after walk with call light in reach and daughter present.  Rodney Langton RN 01/22/2014 11:35 AM

## 2014-01-22 NOTE — Clinical Social Work Note (Signed)
Patient to be d/c'ed today to [facility].  Patient and family agreeable to plans will transport via ems RN to call report.  Family very grateful to the care being provided and are happy to be discharging to [facility].  Evette Cristal, MSW, [place]

## 2014-01-22 NOTE — Progress Notes (Signed)
      [address]       [place] [address]             [phone]      3 Days Post-Op Procedure(s) (LRB): TRANSCATHETER AORTIC VALVE REPLACEMENT, TRANSFEMORAL (N/A) INTRAOPERATIVE TRANSESOPHAGEAL ECHOCARDIOGRAM (N/A) Subjective: Feels very well, some abdominal discomfort during night that is gone now  Objective: Vital signs in last 24 hours: Temp:  [98.1 F (36.7 C)-98.2 F (36.8 C)] 98.1 F (36.7 C) (12/04 0510) Pulse Rate:  [78-90] 78 (12/04 0510) Cardiac Rhythm:  [-] Heart block (12/03 1920) Resp:  [18] 18 (12/04 0510) BP: (145-157)/(48-65) 156/48 mmHg (12/04 0510) SpO2:  [94 %-96 %] 94 % (12/04 0510) Weight:  [145 lb 8.1 oz (66 kg)] 145 lb 8.1 oz (66 kg) (12/04 0521)  Hemodynamic parameters for last 24 hours:    Intake/Output from previous day: 12/03 0701 - 12/04 0700 In: 960 [P.O.:960] Out: 500 [Urine:500] Intake/Output this shift:    General appearance: alert, cooperative and no distress Heart: regular rate and rhythm and 2/6 systolic murmur Lungs: clear to auscultation bilaterally Abdomen: benign Extremities: no edema Wound: incis healing without evid of infection, some echymosis, no hematoma  Lab Results:  Recent Labs  01/19/14 1045 01/19/14 1058 01/20/14 0318  WBC 6.2  --  6.9  HGB 10.4* 12.9 10.4*  HCT 31.1* 38.0 31.2*  PLT 234  --  236   BMET:  Recent Labs  01/19/14 0943 01/19/14 1058 01/20/14 0318  NA 137 136* 138  K 3.6* 3.5* 3.7  CL 100  --  103  CO2  --   --  22  GLUCOSE 130* 103* 95  BUN 15  --  11  CREATININE 0.90  --  0.84  CALCIUM  --   --  9.0    PT/INR:  Recent Labs  01/19/14 1045  LABPROT 15.7*  INR 1.24   ABG    Component Value Date/Time   PHART 7.363 01/19/2014 1046   HCO3 25.5* 01/19/2014 1046   TCO2 27 01/19/2014 1046   O2SAT 96.0 01/19/2014 1046   CBG (last 3)   Recent Labs  01/20/14 0337 01/20/14 0749 01/20/14 1139  GLUCAP 84 88 103*    Meds Scheduled Meds: . acetaminophen   1,000 mg Oral 4 times per day  . amLODipine  10 mg Oral Daily  . aspirin  81 mg Oral Daily  . carvedilol  12.5 mg Oral BID  . clopidogrel  75 mg Oral Q breakfast  . pantoprazole  40 mg Oral Daily  . sodium chloride  3 mL Intravenous Q12H   Continuous Infusions:  PRN Meds:.sodium chloride, fluticasone, metoprolol, ondansetron (ZOFRAN) IV, oxyCODONE, potassium chloride, traMADol  Xrays No results found.  Assessment/Plan: S/P Procedure(s) (LRB): TRANSCATHETER AORTIC VALVE REPLACEMENT, TRANSFEMORAL (N/A) INTRAOPERATIVE TRANSESOPHAGEAL ECHOCARDIOGRAM (N/A) 1 doing very well 2 some systolic hypertension- restart home HCTZ 3 SNF soon   LOS: 3 days    [name],[name] 01/22/2014

## 2014-01-25 ENCOUNTER — Non-Acute Institutional Stay (SKILLED_NURSING_FACILITY): Payer: Medicare Other | Admitting: Internal Medicine

## 2014-01-25 DIAGNOSIS — I5032 Chronic diastolic (congestive) heart failure: Secondary | ICD-10-CM

## 2014-01-25 DIAGNOSIS — R5381 Other malaise: Secondary | ICD-10-CM

## 2014-01-25 DIAGNOSIS — Z954 Presence of other heart-valve replacement: Secondary | ICD-10-CM

## 2014-01-25 DIAGNOSIS — Z952 Presence of prosthetic heart valve: Secondary | ICD-10-CM

## 2014-01-25 DIAGNOSIS — E785 Hyperlipidemia, unspecified: Secondary | ICD-10-CM

## 2014-01-25 DIAGNOSIS — K219 Gastro-esophageal reflux disease without esophagitis: Secondary | ICD-10-CM

## 2014-01-25 DIAGNOSIS — I10 Essential (primary) hypertension: Secondary | ICD-10-CM

## 2014-01-25 DIAGNOSIS — D62 Acute posthemorrhagic anemia: Secondary | ICD-10-CM

## 2014-01-25 DIAGNOSIS — M1990 Unspecified osteoarthritis, unspecified site: Secondary | ICD-10-CM

## 2014-01-25 NOTE — Progress Notes (Signed)
Patient ID: [name], female   DOB: [birth year], 80 y.o.   MRN: [id number]     [facility]   PCP: Ky Barban, MD  Code Status: full code  Allergies  Allergen Reactions  . Aspirin Nausea And Vomiting  . Codeine Nausea And Vomiting    Chief Complaint  Patient presents with  . New Admit To SNF     HPI:  80 y/o female patient is here for STR post hospital admission from 01/19/14-01/22/14 with severe aortic stenosis. She underwent aortic valve replacement. She is seen in her room today. She denies any concerns. She has been working with therapy team She has pmh of CHF, HTN, GERD, severe OA of her knee and HLD.  Review of Systems:  Constitutional: Negative for fever, chills, malaise/fatigue and diaphoresis.  HENT: Negative for congestion  Respiratory: Negative for cough, sputum production, shortness of breath and wheezing.   Cardiovascular: Negative for chest pain, palpitations, leg swelling.  Gastrointestinal: Negative for heartburn, nausea, vomiting, abdominal pain, diarrhea and constipation. appetite is good Genitourinary: Negative for dysuriain.  Musculoskeletal: Negative for back pain, falls Skin: Negative for itching, rash.  Neurological: Negative for dizziness, tingling, focal weakness and headaches.  Psychiatric/Behavioral: Negative for depression  Past Medical History  Diagnosis Date  . Hypertension   . Hyperlipidemia   . GERD (gastroesophageal reflux disease) 05/14/2011  . Arthritis     Both knees R>L  . Chronic diastolic congestive heart failure, NYHA class 2   . Severe aortic stenosis 12/06/2013  . Heart murmur   . Shortness of breath dyspnea   . Anxiety   . S/P TAVR (transcatheter aortic valve replacement) 01/19/2014    23 mm Edwards Sapien 3 transcatheter heart valve placed via open right transfemoral approach   Past Surgical History  Procedure Laterality Date  . Cholecystectomy  2008  . Tonsillectomy      70  years ago  . Refractive surgery Bilateral   . Cataract extraction Bilateral   . Knee arthroscopy Bilateral   . Transcatheter aortic valve replacement, transfemoral N/A 01/19/2014    Procedure: TRANSCATHETER AORTIC VALVE REPLACEMENT, TRANSFEMORAL;  Surgeon: Sherren Mocha, MD;  Location: [facility];  Service: Open Heart Surgery;  Laterality: N/A;  . Intraoperative transesophageal echocardiogram N/A 01/19/2014    Procedure: INTRAOPERATIVE TRANSESOPHAGEAL ECHOCARDIOGRAM;  Surgeon: Sherren Mocha, MD;  Location: [facility] OR;  Service: Open Heart Surgery;  Laterality: N/A;   Social History:   reports that she has never smoked. She does not have any smokeless tobacco history on file. She reports that she does not drink alcohol or use illicit drugs.  Family History  Problem Relation Age of Onset  . Breast cancer Sister     2 sisters  . Colon cancer Neg Hx   . Muscular dystrophy Father     Died at 54    Medications: Patient's Medications  New Prescriptions   No medications on file  Previous Medications   ACETAMINOPHEN (TYLENOL) 500 MG TABLET    Take 2 tablets (1,000 mg total) by mouth every 6 (six) hours.   AMLODIPINE (NORVASC) 10 MG TABLET    Take 10 mg by mouth daily.   ASPIRIN 81 MG CHEWABLE TABLET    Chew 1 tablet (81 mg total) by mouth daily.   CARVEDILOL (COREG) 12.5 MG TABLET    Take 12.5 mg by mouth 2 (two) times daily.    CHOLECALCIFEROL (VITAMIN D) 1000 UNITS TABLET    Take 1,000 Units by  mouth 2 (two) times daily.    CLOPIDOGREL (PLAVIX) 75 MG TABLET    Take 1 tablet (75 mg total) by mouth daily with breakfast.   FENOFIBRATE MICRONIZED (LOFIBRA) 200 MG CAPSULE    Take 200 mg by mouth daily.    FISH OIL-OMEGA-3 FATTY ACIDS 1000 MG CAPSULE    Take 1 g by mouth daily. With 300 mg of Omega 3   FLUTICASONE (FLONASE) 50 MCG/ACT NASAL SPRAY    Place 1 spray into both nostrils 2 (two) times daily as needed (congestion).   HYDROCHLOROTHIAZIDE (HYDRODIURIL) 25 MG TABLET    Take 25 mg by mouth daily.     NAPROXEN SODIUM (ALEVE) 220 MG TABLET    Take 220 mg by mouth 2 (two) times daily as needed (pain/headache).   OMEPRAZOLE (PRILOSEC) 40 MG CAPSULE    Take 40 mg by mouth daily.  Modified Medications   No medications on file  Discontinued Medications   No medications on file     Physical Exam: Filed Vitals:   01/25/14 0945  BP: 135/66  Pulse: 91  Temp: 97.1 F (36.2 C)  Resp: 19  Weight: 148 lb (67.132 kg)  SpO2: 94%   Admission bo 179/88, recheck this am has normal bp  General- elderly female in no acute distress Head- atraumatic, normocephalic Eyes- PERRLA, EOMI, no pallor, no icterus, no discharge Neck- no cervical lymphadenopathy Throat- moist mucus membrane Cardiovascular- normal s1,s2, no murmurs, normal palpable dorsalis pedis Respiratory- bilateral clear to auscultation, no wheeze, no rhonchi, no crackles, no use of accessory muscles Abdomen- bowel sounds present, soft, non tender Musculoskeletal- able to move all 4 extremities, using walker, steady gait, no leg edema Neurological- no focal deficit Skin- warm and dry, incision site in groin area has glue and mild erythema, healing well Psychiatry- alert and oriented to person, place and time, normal mood and affect   Labs reviewed: Basic Metabolic Panel:  Recent Labs  11/30/13 1608 01/15/14 1236  01/19/14 0900 01/19/14 0943 01/19/14 1058 01/20/14 0318  NA 137 137  < > 136* 137 136* 138  K 4.3 4.0  < > 3.5* 3.6* 3.5* 3.7  CL 98 98  < > 102 100  --  103  CO2 28 24  --   --   --   --  22  GLUCOSE 118* 156*  < > 139* 130* 103* 95  BUN 20 18  < > 15 15  --  11  CREATININE 1.06 0.97  < > 0.80 0.90  --  0.84  CALCIUM 10.3 10.0  --   --   --   --  9.0  MG  --   --   --   --   --   --  1.6  < > = values in this interval not displayed. Liver Function Tests:  Recent Labs  01/15/14 1236  AST 17  ALT 17  ALKPHOS 56  BILITOT 0.3  PROT 8.2  ALBUMIN 3.9   No results for input(s): LIPASE, AMYLASE in the  last 8760 hours. No results for input(s): AMMONIA in the last 8760 hours. CBC:  Recent Labs  01/19/14 0554  01/19/14 1045 01/19/14 1058 01/20/14 0318  WBC 5.4  --  6.2  --  6.9  HGB 11.3*  < > 10.4* 12.9 10.4*  HCT 34.6*  < > 31.1* 38.0 31.2*  MCV 86.5  --  87.6  --  87.9  PLT 345  --  234  --  236  < > =  values in this interval not displayed. Cardiac Enzymes: No results for input(s): CKTOTAL, CKMB, CKMBINDEX, TROPONINI in the last 8760 hours. BNP: Invalid input(s): POCBNP CBG:  Recent Labs  01/20/14 0337 01/20/14 0749 01/20/14 1139  GLUCAP 84 88 103*    Radiological Exams: Dg Chest Port 1 View 01/20/2014   CLINICAL DATA:  Transcatheter aortic valve replacement  EXAM: PORTABLE CHEST - 1 VIEW  COMPARISON:  01/19/2014  FINDINGS: Swan-Ganz catheter has been removed. Negative for heart failure or edema. Mild atelectasis in the lung bases. No significant effusion.  IMPRESSION: Mild bibasilar atelectasis.   Electronically Signed   By: Franchot Gallo M.D.   On: 01/20/2014 07:48   Dg Chest Port 1 View 01/19/2014   CLINICAL DATA:  Aortic valve replacement.  EXAM: PORTABLE CHEST - 1 VIEW  COMPARISON:  01/15/2014  FINDINGS: Heart size is normal. Pulmonary vascularity is at the upper limits of normal. Prosthetic aortic valve in place. Swan-Ganz catheter tip is in the right main pulmonary artery. Minimal atelectasis at the left lung base. No pneumothorax or pleural effusion.  IMPRESSION: No acute abnormalities other than slight atelectasis at the left lung base.   Electronically Signed   By: Rozetta Nunnery M.D.   On: 01/19/2014 11:25   Echo: 01/20/2014 Conclusions - Left ventricle: The cavity size was normal. The estimated   ejection fraction was 65%. Wall motion was normal; there were no   regional wall motion abnormalities. - Aortic valve: The TAVR prosthesis is working well. There was no   significant regurgitation  Assessment/Plan  Physical deconditioning Will have patient work  with PT/OT as tolerated to regain strength and restore function.  Fall precautions are in place.  Severe AS S/p AVR.Has f/u with cardiology ad thoracic surgery. Will have patient work with PT/OT as tolerated to regain strength and restore function.  Fall precautions are in place. Continue right groin skin care  Anemia Likely post op from blood loss. Check cbc  HTN On norvasc 10 mg daily, coreg 12.5 mg bid and hctz 25 mg daily. Continue these and monitor bp. Check bmp  CHF euvolemic on exam. Continue coreg.   OA Severe DJD of her knee. Continue vit d supplement and prn aleve  HLD Continue her home regimen fish oil and lofibra  gerd Continue prilosec 40 mg daily  Allergic rhinitis Continue flonase bid and monitor    Family/ staff Communication: reviewed care plan with patient and nursing supervisor   Goals of care: short term rehabilitation    Labs/tests ordered: cbc, bmp 1 week    Blanchie Serve, MD  [facility] [phone] (Monday-Friday 8 am - 5 pm) [phone] (afterhours)

## 2014-01-27 ENCOUNTER — Non-Acute Institutional Stay (SKILLED_NURSING_FACILITY): Payer: Medicare Other | Admitting: Adult Health

## 2014-01-27 DIAGNOSIS — R5381 Other malaise: Secondary | ICD-10-CM

## 2014-01-27 DIAGNOSIS — E785 Hyperlipidemia, unspecified: Secondary | ICD-10-CM

## 2014-01-27 DIAGNOSIS — D62 Acute posthemorrhagic anemia: Secondary | ICD-10-CM

## 2014-01-27 DIAGNOSIS — I5032 Chronic diastolic (congestive) heart failure: Secondary | ICD-10-CM

## 2014-01-27 DIAGNOSIS — Z952 Presence of prosthetic heart valve: Secondary | ICD-10-CM

## 2014-01-27 DIAGNOSIS — I10 Essential (primary) hypertension: Secondary | ICD-10-CM

## 2014-01-27 DIAGNOSIS — J309 Allergic rhinitis, unspecified: Secondary | ICD-10-CM

## 2014-01-27 DIAGNOSIS — Z954 Presence of other heart-valve replacement: Secondary | ICD-10-CM

## 2014-01-27 DIAGNOSIS — K219 Gastro-esophageal reflux disease without esophagitis: Secondary | ICD-10-CM

## 2014-01-28 ENCOUNTER — Encounter (HOSPITAL_COMMUNITY): Payer: Self-pay | Admitting: Cardiovascular Disease

## 2014-01-28 ENCOUNTER — Telehealth: Payer: Self-pay | Admitting: Cardiovascular Disease

## 2014-01-28 DIAGNOSIS — R262 Difficulty in walking, not elsewhere classified: Secondary | ICD-10-CM

## 2014-01-28 DIAGNOSIS — Z48812 Encounter for surgical aftercare following surgery on the circulatory system: Secondary | ICD-10-CM

## 2014-01-28 DIAGNOSIS — I10 Essential (primary) hypertension: Secondary | ICD-10-CM

## 2014-01-28 DIAGNOSIS — I5032 Chronic diastolic (congestive) heart failure: Secondary | ICD-10-CM

## 2014-01-28 DIAGNOSIS — M1990 Unspecified osteoarthritis, unspecified site: Secondary | ICD-10-CM

## 2014-01-28 DIAGNOSIS — D62 Acute posthemorrhagic anemia: Secondary | ICD-10-CM

## 2014-01-28 NOTE — Progress Notes (Signed)
Patient ID: [name], female   DOB: 12/18/1925, 80 y.o.   MRN: [id number]   01/27/14  Facility:  [facility] Home Location:  [place] Room Number: [id number] LEVEL OF CARE:  SNF (31)   Chief Complaint  Patient presents with  . Discharge Note    Physical deconditioning, severe aortic valve stenosis S/P TAVR, hypertension, hyperlipidemia, GERD, allergic rhinitis, anemia and chronic diastolic CHF    HISTORY OF PRESENT ILLNESS:  This is an 80 year old female who is for discharge home with home health PT. She has been admitted to [facility] on 01/22/14 from [facility] with severe aortic valve stenosis status post TAVR (transcatheter aortic valve replacement). Patient was admitted to this facility for short-term rehabilitation after the patient's recent hospitalization.  Patient has completed SNF rehabilitation and therapy has cleared the patient for discharge.  REASSESSMENT OF ONGOING PROBLEMS:  HTN: Pt 's HTN remains stable.  Denies CP, sob, DOE, pedal edema, headaches, dizziness or visual disturbances.  No complications from the medications currently being used.  Last BP : 119/87  ALLERGIC RHINITIS: Allergic rhinitis remains stable.  Patient denies ongoing symptoms such as runny nose sneezing or tearing. No complications reported from the current medication(s) being used.  GERD: pt's GERD is stable.  Denies ongoing heartburn, abd. Pain, nausea or vomiting.  Currently on a PPI & tolerates it without any adverse reactions.   PAST MEDICAL HISTORY:  Past Medical History  Diagnosis Date  . Hypertension   . Hyperlipidemia   . GERD (gastroesophageal reflux disease) 05/14/2011  . Arthritis     Both knees R>L  . Chronic diastolic congestive heart failure, NYHA class 2   . Severe aortic stenosis 12/06/2013  . Heart murmur   . Shortness of breath dyspnea   . Anxiety   . S/P TAVR (transcatheter aortic valve replacement) 01/19/2014    23 mm Edwards  Sapien 3 transcatheter heart valve placed via open right transfemoral approach    CURRENT MEDICATIONS: Reviewed per MAR/see medication list  Allergies  Allergen Reactions  . Aspirin Nausea And Vomiting  . Codeine Nausea And Vomiting     REVIEW OF SYSTEMS:  GENERAL: no change in appetite, no fatigue, no weight changes, no fever, chills or weakness RESPIRATORY: no cough, SOB, DOE, wheezing, hemoptysis CARDIAC: no chest pain, edema or palpitations GI: no abdominal pain, diarrhea, constipation, heart burn, nausea or vomiting  PHYSICAL EXAMINATION  GENERAL: no acute distress, normal body habitus SKIN:  Right groin surgical wound is dry, no erythema EYES: conjunctivae normal, sclerae normal, normal eye lids NECK: supple, trachea midline, no neck masses, no thyroid tenderness, no thyromegaly LYMPHATICS: no LAN in the neck, no supraclavicular LAN RESPIRATORY: breathing is even & unlabored, BS CTAB CARDIAC: RRR, no murmur,no extra heart sounds, no edema GI: abdomen soft, normal BS, no masses, no tenderness, no hepatomegaly, no splenomegaly EXTREMITIES: Able to move all 4 extremities PSYCHIATRIC: the patient is alert & oriented to person, affect & behavior appropriate  LABS/RADIOLOGY: Labs reviewed: Basic Metabolic Panel:  Recent Labs  11/30/13 1608 01/15/14 1236  01/19/14 0900 01/19/14 0943 01/19/14 1058 01/20/14 0318  NA 137 137  < > 136* 137 136* 138  K 4.3 4.0  < > 3.5* 3.6* 3.5* 3.7  CL 98 98  < > 102 100  --  103  CO2 28 24  --   --   --   --  22  GLUCOSE 118* 156*  < > 139* 130*  103* 95  BUN 20 18  < > 15 15  --  11  CREATININE 1.06 0.97  < > 0.80 0.90  --  0.84  CALCIUM 10.3 10.0  --   --   --   --  9.0  MG  --   --   --   --   --   --  1.6  < > = values in this interval not displayed.  Liver Function Tests:  Recent Labs  01/15/14 1236  AST 17  ALT 17  ALKPHOS 56  BILITOT 0.3  PROT 8.2  ALBUMIN 3.9   CBC:  Recent Labs  01/19/14 0554  01/19/14 1045  01/19/14 1058 01/20/14 0318  WBC 5.4  --  6.2  --  6.9  HGB 11.3*  < > 10.4* 12.9 10.4*  HCT 34.6*  < > 31.1* 38.0 31.2*  MCV 86.5  --  87.6  --  87.9  PLT 345  --  234  --  236  < > = values in this interval not displayed.  CBG:  Recent Labs  01/20/14 0337 01/20/14 0749 01/20/14 1139  GLUCAP 84 88 103*    Dg Chest 2 View  01/15/2014   CLINICAL DATA:  Transcatheter aortic valve replacement.  EXAM: CHEST  2 VIEW  COMPARISON:  Chest radiograph 06/25/2007  FINDINGS: The heart size is normal. Thoracic aorta contour is normal. There is atherosclerotic calcification of the thoracic aortic arch. Probable calcified lymph node right hilum. Mitral annulus calcifications visible. Pulmonary vascularity normal. Lungs are well expanded and clear. Negative for pleural effusion or pneumothorax.  The bones appear osteopenic. Vertebral bodies are normal in height and alignment and demonstrate typical degenerative change. There is a mild convex right scoliosis of the lower thoracic/upper lumbar spine. Atherosclerotic calcification of the abdominal aorta is visualized. Cholecystectomy clips noted.  IMPRESSION: No acute cardiopulmonary disease.  Mitral anulus calcifications.  Thoracoabdominal aortic atherosclerosis.   Electronically Signed   By: Curlene Dolphin M.D.   On: 01/15/2014 10:14   Dg Chest Port 1 View  01/20/2014   CLINICAL DATA:  Transcatheter aortic valve replacement  EXAM: PORTABLE CHEST - 1 VIEW  COMPARISON:  01/19/2014  FINDINGS: Swan-Ganz catheter has been removed. Negative for heart failure or edema. Mild atelectasis in the lung bases. No significant effusion.  IMPRESSION: Mild bibasilar atelectasis.   Electronically Signed   By: Franchot Gallo M.D.   On: 01/20/2014 07:48   Dg Chest Port 1 View  01/19/2014   CLINICAL DATA:  Aortic valve replacement.  EXAM: PORTABLE CHEST - 1 VIEW  COMPARISON:  01/15/2014  FINDINGS: Heart size is normal. Pulmonary vascularity is at the upper limits of normal.  Prosthetic aortic valve in place. Swan-Ganz catheter tip is in the right main pulmonary artery. Minimal atelectasis at the left lung base. No pneumothorax or pleural effusion.  IMPRESSION: No acute abnormalities other than slight atelectasis at the left lung base.   Electronically Signed   By: Rozetta Nunnery M.D.   On: 01/19/2014 11:25    ASSESSMENT/PLAN:  Physical deconditioning - for home health PT Severe aortic valve stenosis S/P TAVR - stable; continue Plavix 75 mg by mouth daily and aspirin 81 mg by mouth daily Hypertension - well controlled; continue on Norvasc 10 mg by mouth daily, Coreg 12.5 mg by mouth twice a day and HCTZ 25 mg by mouth daily Hyperlipidemia - continue Lofibra 200 mg by mouth daily and fish oil omega-3 fatty acid 1000 mg by mouth  daily GERD - stable; continue Prilosec 40 mg by mouth daily Allergic rhinitis - stable; continue Flonase when necessary Anemia, acute blood loss - stable; hemoglobin [id number] Chronic diastolic CHF - well compensated; continue Coreg  I have filled out patient's discharge paperwork and written prescriptions.  Patient will receive home health PT.   Total discharge time: Less than 30 minutes  Discharge time involved coordination of the discharge process with Education officer, museum, nursing staff and therapy department. Medical justification for home health services verified.  CPT CODE: 91478    MEDINA-VARGAS,MONINA, [facility] [phone]

## 2014-02-01 NOTE — Telephone Encounter (Signed)
Closed encounter °

## 2014-02-02 ENCOUNTER — Ambulatory Visit (INDEPENDENT_AMBULATORY_CARE_PROVIDER_SITE_OTHER): Payer: Medicare Other | Admitting: Nurse Practitioner

## 2014-02-02 ENCOUNTER — Encounter: Payer: Self-pay | Admitting: Nurse Practitioner

## 2014-02-02 VITALS — BP 142/68 | HR 85 | Wt 145.8 lb

## 2014-02-02 DIAGNOSIS — Z952 Presence of prosthetic heart valve: Secondary | ICD-10-CM

## 2014-02-02 DIAGNOSIS — I350 Nonrheumatic aortic (valve) stenosis: Secondary | ICD-10-CM

## 2014-02-02 DIAGNOSIS — Z954 Presence of other heart-valve replacement: Secondary | ICD-10-CM

## 2014-02-02 LAB — BASIC METABOLIC PANEL
BUN: 21 mg/dL (ref 6–23)
CO2: 26 mEq/L (ref 19–32)
Calcium: 9.7 mg/dL (ref 8.4–10.5)
Chloride: 95 mEq/L — ABNORMAL LOW (ref 96–112)
Creatinine, Ser: 1 mg/dL (ref 0.4–1.2)
GFR: 57.08 mL/min — ABNORMAL LOW (ref >60.00)
Glucose, Bld: 103 mg/dL — ABNORMAL HIGH (ref 70–99)
Potassium: 4 mEq/L (ref 3.5–5.1)
Sodium: 129 mEq/L — ABNORMAL LOW (ref 135–145)

## 2014-02-02 LAB — CBC
HCT: 36.5 % (ref 36.0–46.0)
Hemoglobin: 11.9 g/dL — ABNORMAL LOW (ref 12.0–15.0)
MCHC: 32.7 g/dL (ref 30.0–36.0)
MCV: 86.9 fl (ref 78.0–100.0)
Platelets: 483 10*3/uL — ABNORMAL HIGH (ref 150.0–400.0)
RBC: 4.19 Mil/uL (ref 3.87–5.11)
RDW: 13.7 % (ref 11.5–15.5)
WBC: 8.6 10*3/uL (ref 4.0–10.5)

## 2014-02-02 NOTE — Progress Notes (Signed)
[name] Date of Birth: 11-23-1925 Medical Record [id number]  History of Present Illness: [name] is seen back today for a post hospital visit. She is s/p TAVR. She is seen for Dr. Burt Knack and Dr. Sallyanne Kuster. She is an 80 year old female who has had severe AS, chronic diastolic CHF, HTN, HLD and OA.  Most recently underwent TAVR with a #23 mm, model #[id number]). She did well post op. Her work up was initiated due to an echo prior to knee replaement. She was discharged to [facility] - home last week.   Comes in today. Here with her niece. She has been back home a week. Surgery was 2 weeks ago. She is doing great. No chest pain. Not short of breath. "Bored out of her mind". Has PT coming to her house - she does not feel she needs. Her right knee is what limits her the most. She is still wanting to proceed with her TKR when Dr. Burt Knack gives her the ok. She has no complaint. Feels ok on her medicines.  Current Outpatient Prescriptions  Medication Sig Dispense Refill  . acetaminophen (TYLENOL) 500 MG tablet Take 2 tablets (1,000 mg total) by mouth every 6 (six) hours. 30 tablet 0  . amLODipine (NORVASC) 10 MG tablet Take 10 mg by mouth daily.    Marland Kitchen aspirin 81 MG chewable tablet Chew 1 tablet (81 mg total) by mouth daily.    . carvedilol (COREG) 12.5 MG tablet Take 12.5 mg by mouth 2 (two) times daily.     . cholecalciferol (VITAMIN D) 1000 UNITS tablet Take 1,000 Units by mouth 2 (two) times daily.     . clopidogrel (PLAVIX) 75 MG tablet Take 1 tablet (75 mg total) by mouth daily with breakfast. 30 tablet 11  . fenofibrate micronized (LOFIBRA) 200 MG capsule Take 200 mg by mouth daily.     . fish oil-omega-3 fatty acids 1000 MG capsule Take 1 g by mouth daily. With 300 mg of Omega 3    . fluticasone (FLONASE) 50 MCG/ACT nasal spray Place 1 spray into both nostrils 2 (two) times daily as needed (congestion).    . hydrochlorothiazide (HYDRODIURIL) 25 MG tablet Take 25 mg by mouth daily.    .  naproxen sodium (ALEVE) 220 MG tablet Take 220 mg by mouth 2 (two) times daily as needed (pain/headache).    Marland Kitchen omeprazole (PRILOSEC) 40 MG capsule Take 40 mg by mouth daily.     No current facility-administered medications for this visit.    Allergies  Allergen Reactions  . Aspirin Nausea And Vomiting  . Codeine Nausea And Vomiting    Past Medical History  Diagnosis Date  . Hypertension   . Hyperlipidemia   . GERD (gastroesophageal reflux disease) 05/14/2011  . Arthritis     Both knees R>L  . Chronic diastolic congestive heart failure, NYHA class 2   . Severe aortic stenosis 12/06/2013  . Heart murmur   . Shortness of breath dyspnea   . Anxiety   . 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 Cholecystectomy  2008  . Tonsillectomy      70 years ago  . Refractive surgery Bilateral   . Cataract extraction Bilateral   . Knee arthroscopy Bilateral   . Transcatheter aortic valve replacement, transfemoral N/A 01/19/2014    Procedure: TRANSCATHETER  AORTIC VALVE REPLACEMENT, TRANSFEMORAL;  Surgeon: Sherren Mocha, MD;  Location: [facility];  Service: Open Heart Surgery;  Laterality: N/A;  . Intraoperative transesophageal echocardiogram N/A 01/19/2014    Procedure: INTRAOPERATIVE TRANSESOPHAGEAL ECHOCARDIOGRAM;  Surgeon: Sherren Mocha, MD;  Location: [facility] OR;  Service: Open Heart Surgery;  Laterality: N/A;  . Left and right heart catheterization with coronary angiogram N/A 12/08/2013    Procedure: LEFT AND RIGHT HEART CATHETERIZATION WITH CORONARY ANGIOGRAM;  Surgeon: Sanda Klein, MD;  Location: [facility] CATH LAB;  Service: Cardiovascular;  Laterality: N/A;    History  Smoking status  . Never Smoker   Smokeless tobacco  . Not on file    History  Alcohol Use No    Family History  Problem Relation Age of Onset  . Breast  cancer Sister     2 sisters  . Colon cancer Neg Hx   . Muscular dystrophy Father     Died at 5    Review of Systems: The review of systems is per the HPI.  All other systems were reviewed and are negative.  Physical Exam: BP 142/68 mmHg  Pulse 85  Wt 145 lb 12.8 oz (66.134 kg)  SpO2 96% Patient is very pleasant and in no acute distress. Looks younger than her stated age. Skin is warm and dry. Color is normal.  HEENT is unremarkable. Normocephalic/atraumatic. PERRL. Sclera are nonicteric. Neck is supple. No masses. No JVD. Lungs are clear. Cardiac exam shows a regular rate and rhythm. She has an outflow murmur noted. Abdomen is soft. Extremities are without edema. Gait and ROM are intact. No gross neurologic deficits noted.  Wt Readings from Last 3 Encounters:  02/02/14 145 lb 12.8 oz (66.134 kg)  01/27/14 148 lb (67.132 kg)  01/25/14 148 lb (67.132 kg)    LABORATORY DATA/PROCEDURES: EKG with NSR   Lab Results  Component Value Date   WBC 6.9 01/20/2014   HGB 10.4* 01/20/2014   HCT 31.2* 01/20/2014   PLT 236 01/20/2014   GLUCOSE 95 01/20/2014   ALT 17 01/15/2014   AST 17 01/15/2014   NA 138 01/20/2014   K 3.7 01/20/2014   CL 103 01/20/2014   CREATININE 0.84 01/20/2014   BUN 11 01/20/2014   CO2 22 01/20/2014   INR 1.24 01/19/2014   HGBA1C 5.4 01/15/2014    BNP (last 3 results) No results for input(s): PROBNP in the last 8760 hours.   Assessment / Plan: 1. S/P TAVR - she is doing great. Will let her start driving as of next week. Ok to start increasing her activities as tolerated. Her knee will be her most limiting factor. She looks great! She has her follow up echo and OV with Dr. Burt Knack as planned.   2. Knee pain  3. Anemia -  Recheck lab today.  4. Advanced age  Patient is agreeable to this plan and will call if any problems develop in the interim.   Burtis Junes, RN, [facility] [address] [place], [place]  [phone]

## 2014-02-02 NOTE — Patient Instructions (Signed)
We will be checking the following labs today BMET & CBC  Stay on your current medicines  Ok to start resuming your prior activities - just start gradually and increase as tolerated.   Ok to start driving short distances as of next week  See Dr. Burt Knack as planned with your echo  Call the [place] office at [phone] if you have any questions, problems or concerns.

## 2014-02-03 ENCOUNTER — Other Ambulatory Visit: Payer: Self-pay | Admitting: *Deleted

## 2014-02-10 ENCOUNTER — Other Ambulatory Visit: Payer: Self-pay

## 2014-02-10 MED ORDER — PANTOPRAZOLE SODIUM 40 MG PO TBEC: 40.0000 mg | DELAYED_RELEASE_TABLET | Freq: Every day | ORAL | Status: DC

## 2014-02-22 ENCOUNTER — Other Ambulatory Visit: Payer: Self-pay | Admitting: Adult Health

## 2014-02-23 ENCOUNTER — Telehealth: Payer: Self-pay | Admitting: Cardiovascular Disease

## 2014-02-23 MED ORDER — OMEPRAZOLE 40 MG PO CPDR: 40.0000 mg | DELAYED_RELEASE_CAPSULE | Freq: Every day | ORAL | Status: DC

## 2014-02-23 NOTE — Telephone Encounter (Signed)
The pt was taking prilosec 40mg  daily prior to TAVR.  Due to the pt being started on plavix she was switched to pantoprazole 40mg  daily.  The pt is having issues with reflux while on this medication.  The pt's reflux was controlled on prilosec.  I will forward this message to Dr Burt Knack for review.

## 2014-02-23 NOTE — Telephone Encounter (Signed)
I spoke with the pt and made her aware that she can stop pantoprazole and restart omeprazole 40mg  daily.  Pt was very appreciative of this change.

## 2014-02-23 NOTE — Telephone Encounter (Signed)
Left message on machine for pt to contact the office.   

## 2014-02-23 NOTE — Telephone Encounter (Signed)
Should be ok to go back on prilosec. thx

## 2014-02-23 NOTE — Telephone Encounter (Signed)
New message   Surgery on  12/1  Valve replacement.   Pt c/o medication issue:   1. Name of Medication: pantoprazole   2. How are you currently taking this medication (dosage and times per day)? 40 mg take 1 tablet in am   3. Are you having a reaction (difficulty breathing--STAT)? Started on yesterday ingestion, burning, sour spitting up  .   4. What is your medication issue?  Unable to take medication.

## 2014-02-25 ENCOUNTER — Other Ambulatory Visit: Payer: Self-pay | Admitting: Adult Health

## 2014-02-26 ENCOUNTER — Other Ambulatory Visit: Payer: Self-pay

## 2014-02-26 MED ORDER — CLOPIDOGREL BISULFATE 75 MG PO TABS: 75.0000 mg | ORAL_TABLET | Freq: Every day | ORAL | Status: DC

## 2014-03-16 ENCOUNTER — Other Ambulatory Visit: Payer: Self-pay

## 2014-03-16 ENCOUNTER — Ambulatory Visit (INDEPENDENT_AMBULATORY_CARE_PROVIDER_SITE_OTHER): Payer: Medicare Other | Admitting: Cardiovascular Disease

## 2014-03-16 ENCOUNTER — Ambulatory Visit (HOSPITAL_COMMUNITY): Payer: Medicare Other | Attending: Cardiology | Admitting: Radiology

## 2014-03-16 ENCOUNTER — Telehealth (HOSPITAL_COMMUNITY): Payer: Self-pay | Admitting: Cardiac Rehabilitation

## 2014-03-16 ENCOUNTER — Encounter: Payer: Self-pay | Admitting: Cardiovascular Disease

## 2014-03-16 VITALS — BP 152/82 | HR 74 | Ht 64.0 in | Wt 146.0 lb

## 2014-03-16 DIAGNOSIS — I359 Nonrheumatic aortic valve disorder, unspecified: Secondary | ICD-10-CM

## 2014-03-16 DIAGNOSIS — E871 Hypo-osmolality and hyponatremia: Secondary | ICD-10-CM

## 2014-03-16 DIAGNOSIS — Z954 Presence of other heart-valve replacement: Secondary | ICD-10-CM | POA: Diagnosis not present

## 2014-03-16 DIAGNOSIS — R42 Dizziness and giddiness: Secondary | ICD-10-CM

## 2014-03-16 DIAGNOSIS — I350 Nonrheumatic aortic (valve) stenosis: Secondary | ICD-10-CM | POA: Insufficient documentation

## 2014-03-16 DIAGNOSIS — Z952 Presence of prosthetic heart valve: Secondary | ICD-10-CM

## 2014-03-16 LAB — CBC
HEMATOCRIT: 37.8 % (ref 36.0–46.0)
HEMOGLOBIN: 12.9 g/dL (ref 12.0–15.0)
MCHC: 34.2 g/dL (ref 30.0–36.0)
MCV: 83.8 fl (ref 78.0–100.0)
PLATELETS: 353 10*3/uL (ref 150.0–400.0)
RBC: 4.51 Mil/uL (ref 3.87–5.11)
RDW: 14.3 % (ref 11.5–15.5)
WBC: 7.6 10*3/uL (ref 4.0–10.5)

## 2014-03-16 LAB — BASIC METABOLIC PANEL
BUN: 23 mg/dL (ref 6–23)
CALCIUM: 10.3 mg/dL (ref 8.4–10.5)
CO2: 28 mEq/L (ref 19–32)
CREATININE: 1.05 mg/dL (ref 0.40–1.20)
Chloride: 101 mEq/L (ref 96–112)
GFR: 52.7 mL/min — ABNORMAL LOW (ref >60.00)
Glucose, Bld: 101 mg/dL — ABNORMAL HIGH (ref 70–99)
Potassium: 4.6 mEq/L (ref 3.5–5.1)
SODIUM: 136 meq/L (ref 135–145)

## 2014-03-16 NOTE — Telephone Encounter (Signed)
pc to pt to enroll in cardiac rehab.  Pt is not interested at this time. She is awaiting knee surgery and would like complete that process first.  Pt states she will call cardiac rehab when she is ready to enroll.

## 2014-03-16 NOTE — Progress Notes (Signed)
Echocardiogram performed.  

## 2014-03-16 NOTE — Progress Notes (Addendum)
Cardiology Office Note   Date:  03/18/2014   ID:  Chenise, Perilli 11-03-1925, MRN [id number]  PCP:  No primary care provider on file.  Cardiologist:  Sherren Mocha, MD    Chief Complaint  Patient presents with  . Follow-up    echo results     History of Present Illness: [name] is a 81 y.o. female who presents for follow-up of aortic valve stenosis now s/p TAVR 01/19/2014. The patient was noted to have severe symptomatic aortic stenosis after a loud heart murmur was appreciated on preoperative exam for planned knee replacement surgery. She underwent extensive evaluation leading to an uncomplicated TAVR procedure about 7 weeks ago. She was treated with a 23 mm Sapien 3 Transcatheter Heart Valve bioprosthesis.   The patient is doing very well. She is primarily limited by knee pain. She does experience some dizziness in the mornings, but has no other complaints. She has been limiting fluids because of hyponatremia. No chest pain, dyspnea, edema, orthopnea, or PND.   Past Medical History  Diagnosis Date  . Hypertension   . Hyperlipidemia   . GERD (gastroesophageal reflux disease) 05/14/2011  . Arthritis     Both knees R>L  . Chronic diastolic congestive heart failure, NYHA class 2   . Severe aortic stenosis 12/06/2013  . Heart murmur   . Shortness of breath dyspnea   . Anxiety   . 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 Cholecystectomy  2008  . Tonsillectomy      70 years ago  . Refractive surgery Bilateral   . Cataract extraction Bilateral   . Knee arthroscopy Bilateral   . Transcatheter aortic valve replacement, transfemoral N/A 01/19/2014    Procedure: TRANSCATHETER AORTIC VALVE REPLACEMENT, TRANSFEMORAL;  Surgeon: Sherren Mocha, MD;  Location: [facility];  Service: Open Heart Surgery;   Laterality: N/A;  . Intraoperative transesophageal echocardiogram N/A 01/19/2014    Procedure: INTRAOPERATIVE TRANSESOPHAGEAL ECHOCARDIOGRAM;  Surgeon: Sherren Mocha, MD;  [facility];  Service: Open Heart Surgery;  Laterality: N/A;  . Left and right heart catheterization with coronary angiogram N/A 12/08/2013    Procedure: LEFT AND RIGHT HEART CATHETERIZATION WITH CORONARY ANGIOGRAM;  Surgeon: Sanda Klein, MD;  Location: [place] CATH LAB;  Service: Cardiovascular;  Laterality: N/A;    Current Outpatient Prescriptions  Medication Sig Dispense Refill  . acetaminophen (TYLENOL) 500 MG tablet Take 2 tablets (1,000 mg total) by mouth every 6 (six) hours. 30 tablet 0  . amLODipine (NORVASC) 10 MG tablet Take 10 mg by mouth daily.    . carvedilol (COREG) 12.5 MG tablet Take 12.5 mg by mouth 2 (two) times daily.     . cholecalciferol (VITAMIN D) 1000 UNITS tablet Take 1,000 Units by mouth 2 (two) times daily.     . clopidogrel (PLAVIX) 75 MG tablet Take 1 tablet (75 mg total) by mouth daily with breakfast. 30 tablet 10  . fenofibrate micronized (LOFIBRA) 200 MG capsule Take 200 mg by mouth daily.     . fish oil-omega-3 fatty acids 1000 MG capsule Take 1 g by mouth daily. With 300 mg of Omega 3    . fluticasone (FLONASE) 50 MCG/ACT nasal spray Place 1 spray into both nostrils 2 (two) times daily as needed (congestion).    . hydrochlorothiazide (HYDRODIURIL) 25 MG tablet Take 12.5 mg  by mouth daily.    . naproxen sodium (ALEVE) 220 MG tablet Take 220 mg by mouth 2 (two) times daily as needed (pain/headache).    Marland Kitchen omeprazole (PRILOSEC) 40 MG capsule Take 1 capsule (40 mg total) by mouth daily.     No current facility-administered medications for this visit.    Allergies:   Aspirin and Codeine   Social History:  The patient  reports that she has never smoked. She does not have any smokeless tobacco history on file. She reports that she does not drink alcohol or use illicit drugs.   Family History:   [name] includes Breast cancer in her sister; Muscular dystrophy in her father. There is no history of Colon cancer.    ROS:  Please see the history of present illness.  Otherwise, review of systems is positive for hearing loss, gait instability, dizziness, leg pain.  All other systems are reviewed and negative.    PHYSICAL EXAM: VS:  BP 152/82 mmHg  Pulse 74  Ht 5\' 4"  (1.626 m)  Wt 146 lb (66.225 kg)  BMI 25.05 kg/m2  SpO2 97% , BMI Body mass index is 25.05 kg/(m^2). GEN: Well nourished, well developed, pleasant elderly woman in no acute distress HEENT: normal Neck: no JVD, carotid bruits, or masses Cardiac: RRR; 2/6 early peaking systolic murmur at the LSB Respiratory:  clear to auscultation bilaterally, normal work of breathing GI: soft, nontender, nondistended, + BS MS: no deformity or atrophy, bilateral groin sites clear Skin: warm and dry, no rash Neuro:  Strength and sensation are intact Psych: euthymic mood, full affect  EKG:  EKG is not ordered today.  Recent Labs: 01/15/2014: ALT 17 01/20/2014: Magnesium 1.6 03/16/2014: BUN 23; Creatinine 1.05; Hemoglobin 12.9; Platelets 353.0; Potassium 4.6; Sodium 136   Lipid Panel  No results found for: CHOL, TRIG, HDL, CHOLHDL, VLDL, LDLCALC, LDLDIRECT    Wt Readings from Last 3 Encounters:  03/16/14 146 lb (66.225 kg)  02/02/14 145 lb 12.8 oz (66.134 kg)  01/27/14 148 lb (67.132 kg)     Other studies Reviewed: 2D Echo 03/16/2014: Study Conclusions  - Left ventricle: The cavity size was normal. There was mild concentric hypertrophy with moderate basal septal hypertrophy. Systolic function was vigorous. The estimated ejection fraction was in the range of 65% to 70%. Wall motion was normal; there were no regional wall motion abnormalities. Doppler parameters are consistent with abnormal left ventricular relaxation (grade 1 diastolic dysfunction). Doppler parameters are consistent  with high ventricular filling pressure. - Aortic valve: TAVR: Sapien -44mm placed on 01/19/14. Bioprosthetic valve. There was no perivalvular leak. Valve is functioning normally. Peak velocity (S): 265 cm/s. - Mitral valve: Severely calcified annulus. Mildly thickened leaflets . Leaflet separation was reduced. The findings are consistent with mild stenosis. Mean gradient (D): 7 mm Hg. - Left atrium: The atrium was moderately dilated.  Impressions:  - Compared to the prior study, there has been no significant interval change.  ASSESSMENT AND PLAN: 1.  Aortic Valve Disorder s/p TAVR. Pt is doing very well, now with NYHA functional class I symptoms. She should continue her current medication without change. She is very eager to have knee replacement. I think she can proceed at low cardiac risk when she is out to 3 months from TAVR. At that point she would hold ASA and plavix for 5-7 days prior to surgery. I will confer with Dr Roxy Manns to make sure he is comfortable with that approach.  2.  HTN -  continue current Rx. Advised to push fluids to improve am dizziness. A metabolic panel is checked today and shows improvement in sodium levels with reduction of HCTZ dose.   Current medicines are reviewed with the patient today.  The patient does not have concerns regarding medicines.  The following changes have been made:  no change  Labs/ tests ordered today include:   Orders Placed This Encounter  Procedures  . Basic Metabolic Panel (BMET)  . CBC  . 2D Echocardiogram without contrast    Disposition:   FU with Dr Sallyanne Kuster in 3 months. FU with me for 12 month echo and valve clinic appointment.   Signed, Sherren Mocha, MD  03/18/2014 2:09 PM    [facility] [place], [place], [place]  [address] Phone: [phone]; Fax: [phone]   ADDENDUM 03/24/2014: Called regarding surgical clearance. See discussion above. Pt at low cardiac risk and can proceed  with knee replacement without further testing. OK to hold ASA and plavix x 5-7 days before surgery as necessary. Resume when safe after surgery. If she goes on DVT-dose Xarelto post-operatively, would not resume plavix.  Sherren Mocha 03/24/2014 1:07 PM

## 2014-03-16 NOTE — Patient Instructions (Signed)
Your physician recommends that you have lab work today: BMP and CBC  Your physician recommends that you schedule a follow-up appointment in: 3 MONTHS with Dr Sallyanne Kuster  Your physician wants you to follow-up in: December with Dr Burt Knack.  You will receive a reminder letter in the mail two months in advance. If you don't receive a letter, please call our office to schedule the follow-up appointment.  Your physician has requested that you have an echocardiogram in December. Echocardiography is a painless test that uses sound waves to create images of your heart. It provides your doctor with information about the size and shape of your heart and how well your heart's chambers and valves are working. This procedure takes approximately one hour. There are no restrictions for this procedure.  Your physician recommends that you continue on your current medications as directed. Please refer to the Current Medication list given to you today.

## 2014-03-24 ENCOUNTER — Telehealth: Payer: Self-pay | Admitting: Cardiovascular Disease

## 2014-03-24 NOTE — Telephone Encounter (Signed)
See addendum to recent office note. Please fax to Dr Debroah Loop office. thx

## 2014-03-24 NOTE — Telephone Encounter (Signed)
Phone line busy 2/3 @10 :40 am/pe

## 2014-03-24 NOTE — Telephone Encounter (Signed)
Informed patient that Dr. Burt Knack is working in hospital today but would return tomorrow, 2/4.  Message routed to Dr. Burt Knack.  Also, patient stated that she has called Mamie Levers, Dr. Louie Casa surgical coordinator four separate times to follow up on her surgery date with no response. Offered to call Dr. Louie Casa office to ask them to call patient and provide correct phone number for patient. Completed. Judeen Hammans is out of office today however another staff member stated they would review and contact patient today. I notified patient of this call and she expressed appreciation for assistance reaching Dr. Louie Casa office.

## 2014-03-24 NOTE — Telephone Encounter (Signed)
New Message  Pt called to discuss status of surgical clearance for Knee surgery w/ Dr. Louie Casa office.  Per pt- was told by Dr. Burt Knack that information would be faxed by this Monday. As of now pt was told they have received no info. Please call back and discuss.

## 2014-03-24 NOTE — Telephone Encounter (Signed)
Spoke with pt and informed her that I had the information for her surgical clearance and sent it over to [facility] and [place] office. Pt verbalized understanding and was very appreciative for call. Faxed over clearance to [employer] office and called and left message on [employer] voicemail letting her know that this has been faxed and to call the office if there are any issues.

## 2014-04-13 ENCOUNTER — Other Ambulatory Visit: Payer: Self-pay | Admitting: Physician Assistant

## 2014-04-13 NOTE — H&P (Signed)
TOTAL KNEE ADMISSION H&P  Patient is being admitted for right total knee arthroplasty.  Subjective:  Chief Complaint:right knee pain.  HPI: [name], 81 y.o. female, has a history of pain and functional disability in the right knee due to arthritis and has failed non-surgical conservative treatments for greater than 12 weeks to includeNSAID's and/or analgesics and corticosteriod injections.  Onset of symptoms was gradual, starting 2 years ago with rapidlly worsening course since that time. The patient noted prior procedures on the knee to include  arthroscopy and menisectomy on the right knee(s).  Patient currently rates pain in the right knee(s) at 8 out of 10 with activity. Patient has night pain, worsening of pain with activity and weight bearing, pain that interferes with activities of daily living and joint swelling.  Patient has evidence of subchondral sclerosis, periarticular osteophytes and joint space narrowing by imaging studies. There is no active infection.  Patient Active Problem List   Diagnosis Date Noted  . 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 Severe aortic valve stenosis 01/19/2014  . Aortic stenosis 12/14/2013  . Arthritis   . Chronic diastolic congestive heart failure, NYHA class 2   . Severe aortic stenosis 12/06/2013  . Essential hypertension 11/18/2013  . Hyperlipidemia 11/18/2013  . Aortic heart murmur 11/18/2013  . Preoperative clearance 11/18/2013  . GERD (gastroesophageal reflux disease) 05/14/2011  . Dysphagia 05/14/2011   Past Medical History  Diagnosis Date  . Hypertension   . Hyperlipidemia   . GERD (gastroesophageal reflux disease) 05/14/2011  . Arthritis     Both knees R>L  . Chronic diastolic congestive heart failure, NYHA class 2   . Severe aortic stenosis 12/06/2013  . Heart murmur   . Shortness of breath dyspnea   . Anxiety   . 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 Cholecystectomy  2008  . Tonsillectomy      70 years ago  . Refractive surgery Bilateral   . Cataract extraction Bilateral   . Knee arthroscopy Bilateral   . Transcatheter aortic valve replacement, transfemoral N/A 01/19/2014    Procedure: TRANSCATHETER AORTIC VALVE REPLACEMENT, TRANSFEMORAL;  Surgeon: Sherren Mocha, MD;  Location: [place];  Service: Open Heart Surgery;  Laterality: N/A;  . Intraoperative transesophageal echocardiogram N/A 01/19/2014    Procedure: INTRAOPERATIVE TRANSESOPHAGEAL ECHOCARDIOGRAM;  Surgeon: Sherren Mocha, MD;  Location: [facility] OR;  Service: Open Heart Surgery;  Laterality: N/A;  . Left and right heart catheterization with coronary angiogram N/A 12/08/2013    Procedure: LEFT AND RIGHT HEART CATHETERIZATION WITH CORONARY ANGIOGRAM;  Surgeon: Sanda Klein, MD;  Location: [place] CATH LAB;  Service: Cardiovascular;  Laterality: N/A;     (Not in a hospital admission) Allergies  Allergen Reactions  . Aspirin Nausea And Vomiting  . Codeine Nausea And Vomiting    History  Substance Use Topics  . Smoking status: Never Smoker   . Smokeless tobacco: Not on file  . Alcohol Use: No    Family History  Problem Relation Age of Onset  . Breast cancer Sister     2 sisters  . Colon cancer Neg Hx   . Muscular dystrophy Father     Died at 62     Review of Systems  Constitutional: Negative.   HENT: Positive for hearing loss.   Eyes: Negative.   Respiratory:  Negative.   Cardiovascular: Negative.   Gastrointestinal: Negative.   Genitourinary: Negative.   Musculoskeletal: Positive for joint pain.  Skin: Negative.   Neurological: Negative.  Negative for headaches.  Endo/Heme/Allergies: Negative.   Psychiatric/Behavioral: Negative.     Objective:  Physical Exam  Constitutional: She is oriented to person, place, and time. She appears  well-developed and well-nourished.  HENT:  Head: Normocephalic and atraumatic.  Eyes: EOM are normal. Pupils are equal, round, and reactive to light.  Neck: Normal range of motion. Neck supple.  Cardiovascular: Normal rate and regular rhythm.   Respiratory: Effort normal and breath sounds normal. No respiratory distress. She has no wheezes. She has no rales.  GI: Soft. Bowel sounds are normal.  Musculoskeletal:  Exam of her right knee reveals trace effusion range of motion 5-95 degrees.  Varus thrust. She is neurovascularly intact distally.  Neurological: She is alert and oriented to person, place, and time.  Skin: Skin is warm and dry.  Psychiatric: She has a normal mood and affect. Her behavior is normal. Judgment and thought content normal.    Vital signs in last 24 hours: @VSRANGES @  Labs:   Estimated body mass index is 25.05 kg/(m^2) as calculated from the following:   Height as of 03/16/14: 5\' 4"  (1.626 m).   Weight as of 03/16/14: 66.225 kg (146 lb).   Imaging Review Plain radiographs demonstrate severe degenerative joint disease of the right knee(s). The overall alignment ismild varus. The bone quality appears to be fair for age and reported activity level.  Assessment/Plan:  End stage arthritis, right knee   The patient history, physical examination, clinical judgment of the provider and imaging studies are consistent with end stage degenerative joint disease of the right knee(s) and total knee arthroplasty is deemed medically necessary. The treatment options including medical management, injection therapy arthroscopy and arthroplasty were discussed at length. The risks and benefits of total knee arthroplasty were presented and reviewed. The risks due to aseptic loosening, infection, stiffness, patella tracking problems, thromboembolic complications and other imponderables were discussed. The patient acknowledged the explanation, agreed to proceed with the plan and consent was  signed. Patient is being admitted for inpatient treatment for surgery, pain control, PT, OT, prophylactic antibiotics, VTE prophylaxis, progressive ambulation and ADL's and discharge planning. The patient is planning to be discharged to skilled nursing facility

## 2014-04-19 ENCOUNTER — Encounter (HOSPITAL_COMMUNITY)
Admission: RE | Admit: 2014-04-19 | Discharge: 2014-04-19 | Disposition: A | Discharge status: Home / Self Care | Payer: Medicare Other | Source: Ambulatory Visit | Attending: Orthopedic Surgery | Admitting: Orthopedic Surgery

## 2014-04-19 ENCOUNTER — Encounter (HOSPITAL_COMMUNITY): Payer: Self-pay

## 2014-04-19 DIAGNOSIS — M179 Osteoarthritis of knee, unspecified: Secondary | ICD-10-CM | POA: Insufficient documentation

## 2014-04-19 DIAGNOSIS — Z01812 Encounter for preprocedural laboratory examination: Secondary | ICD-10-CM | POA: Diagnosis present

## 2014-04-19 LAB — CBC WITH DIFFERENTIAL/PLATELET
BASOS PCT: 0 % (ref 0–1)
Basophils Absolute: 0 10*3/uL (ref 0.0–0.1)
Eosinophils Absolute: 0.2 10*3/uL (ref 0.0–0.7)
Eosinophils Relative: 3 % (ref 0–5)
HCT: 34.2 % — ABNORMAL LOW (ref 36.0–46.0)
Hemoglobin: 11.4 g/dL — ABNORMAL LOW (ref 12.0–15.0)
LYMPHS PCT: 31 % (ref 12–46)
Lymphs Abs: 1.9 10*3/uL (ref 0.7–4.0)
MCH: 29.2 pg (ref 26.0–34.0)
MCHC: 33.3 g/dL (ref 30.0–36.0)
MCV: 87.5 fL (ref 78.0–100.0)
Monocytes Absolute: 0.6 10*3/uL (ref 0.1–1.0)
Monocytes Relative: 10 % (ref 3–12)
NEUTROS ABS: 3.4 10*3/uL (ref 1.7–7.7)
NEUTROS PCT: 56 % (ref 43–77)
PLATELETS: 351 10*3/uL (ref 150–400)
RBC: 3.91 MIL/uL (ref 3.87–5.11)
RDW: 14 % (ref 11.5–15.5)
WBC: 6.1 10*3/uL (ref 4.0–10.5)

## 2014-04-19 LAB — COMPREHENSIVE METABOLIC PANEL
ALBUMIN: 3.8 g/dL (ref 3.5–5.2)
ALK PHOS: 50 U/L (ref 39–117)
ALT: 17 U/L (ref 0–35)
AST: 21 U/L (ref 0–37)
Anion gap: 7 (ref 5–15)
BUN: 18 mg/dL (ref 6–23)
CHLORIDE: 101 mmol/L (ref 96–112)
CO2: 26 mmol/L (ref 19–32)
Calcium: 9.4 mg/dL (ref 8.4–10.5)
Creatinine, Ser: 1.18 mg/dL — ABNORMAL HIGH (ref 0.50–1.10)
GFR, EST AFRICAN AMERICAN: 47 mL/min — AB (ref >90)
GFR, EST NON AFRICAN AMERICAN: 40 mL/min — AB (ref >90)
GLUCOSE: 158 mg/dL — AB (ref 70–99)
POTASSIUM: 3.7 mmol/L (ref 3.5–5.1)
Sodium: 134 mmol/L — ABNORMAL LOW (ref 135–145)
Total Bilirubin: 0.6 mg/dL (ref 0.3–1.2)
Total Protein: 7.3 g/dL (ref 6.0–8.3)

## 2014-04-19 LAB — URINALYSIS, ROUTINE W REFLEX MICROSCOPIC
Bilirubin Urine: NEGATIVE
Glucose, UA: NEGATIVE mg/dL
Hgb urine dipstick: NEGATIVE
KETONES UR: NEGATIVE mg/dL
Leukocytes, UA: NEGATIVE
NITRITE: NEGATIVE
PH: 6 (ref 5.0–8.0)
PROTEIN: NEGATIVE mg/dL
SPECIFIC GRAVITY, URINE: 1.008 (ref 1.005–1.030)
Urobilinogen, UA: 0.2 mg/dL (ref 0.0–1.0)

## 2014-04-19 LAB — APTT: APTT: 25 s (ref 24–37)

## 2014-04-19 LAB — SURGICAL PCR SCREEN
MRSA, PCR: NEGATIVE
Staphylococcus aureus: NEGATIVE

## 2014-04-19 LAB — PROTIME-INR
INR: 1.04 (ref 0.00–1.49)
PROTHROMBIN TIME: 13.7 s (ref 11.6–15.2)

## 2014-04-19 MED ORDER — CHLORHEXIDINE GLUCONATE 4 % EX LIQD: 60.0000 mL | Freq: Once | CUTANEOUS | Status: DC

## 2014-04-19 NOTE — Pre-Procedure Instructions (Signed)
KEJA GRESSLEY  04/19/2014   Your procedure is scheduled on: Wednesday, April 28, 2014  Report to [facility] Admitting at 11:00 AM.  Call this number if you have problems the morning of surgery: [phone]   Remember: [place] on 04/23/14   Do not eat food or drink liquids after midnight Tuesday, April 27, 2014   Take these medicines the morning of surgery with A SIP OF WATER: acetaminophen (TYLENOL),  amLODipine (NORVASC), carvedilol (COREG), omeprazole (PRILOSEC), if needed: fluticasone (FLONASE) nasal spray for congestion  Stop taking Aspirin, vitamins, and herbal medications such as  fish oil-omega-3 fatty acids.  Do not take any NSAIDs ie: Ibuprofen, Advil, Naproxen ( Aleve) or any medication containing Aspirin; stop 1 week prior to procedure ( Wednesday, April 21, 2014 ) .   Do not wear jewelry, make-up or nail polish.  Do not wear lotions, powders, or perfumes. You may not wear deodorant.  Do not shave 48 hours prior to surgery.  Do not bring valuables to the hospital.  [facility] is not responsible for any belongings or valuables.               Contacts, dentures or bridgework may not be worn into surgery.  Leave suitcase in the car. After surgery it may be brought to your room.  For patients admitted to the hospital, discharge time is determined by your treatment team.               Patients discharged the day of surgery will not be allowed to drive home.  Name and phone number of your driver:   Special Instructions:  Special Instructions:Special Instructions: [facility] - Preparing for Surgery  Before surgery, you can play an important role.  Because skin is not sterile, your skin needs to be as free of germs as possible.  You can reduce the number of germs on you skin by washing with CHG (chlorahexidine gluconate) soap before surgery.  CHG is an antiseptic cleaner which kills germs and bonds with the skin to continue killing  germs even after washing.  Please DO NOT use if you have an allergy to CHG or antibacterial soaps.  If your skin becomes reddened/irritated stop using the CHG and inform your nurse when you arrive at Short Stay.  Do not shave (including legs and underarms) for at least 48 hours prior to the first CHG shower.  You may shave your face.  Please follow these instructions carefully:   1.  Shower with CHG Soap the night before surgery and the morning of Surgery.  2.  If you choose to wash your hair, wash your hair first as usual with your normal shampoo.  3.  After you shampoo, rinse your hair and body thoroughly to remove the Shampoo.  4.  Use CHG as you would any other liquid soap.  You can apply chg directly  to the skin and wash gently with scrungie or a clean washcloth.  5.  Apply the CHG Soap to your body ONLY FROM THE NECK DOWN.  Do not use on open wounds or open sores.  Avoid contact with your eyes, ears, mouth and genitals (private parts).  Wash genitals (private parts) with your normal soap.  6.  Wash thoroughly, paying special attention to the area where your surgery will be performed.  7.  Thoroughly rinse your body with warm water from the neck down.  8.  DO NOT shower/wash with your normal  soap after using and rinsing off the CHG Soap.  9.  Pat yourself dry with a clean towel.            10.  Wear clean pajamas.            11.  Place clean sheets on your bed the night of your first shower and do not sleep with pets.  Day of Surgery  Do not apply any lotions/deodorants the morning of surgery.  Please wear clean clothes to the hospital/surgery center.   Please read over the following fact sheets that you were given: Pain Booklet, Coughing and Deep Breathing, Blood Transfusion Information, Total Joint Packet, MRSA Information and Surgical Site Infection Prevention

## 2014-04-20 LAB — URINE CULTURE

## 2014-04-20 LAB — TYPE AND SCREEN
ABO/RH(D): O POS
Antibody Screen: NEGATIVE

## 2014-04-20 NOTE — Progress Notes (Signed)
Anesthesia chart review: Patient is a 81 year old female scheduled for right TKR on 04/28/14 by Dr. Kathryne Hitch.  History includes non-smoker, HTN, HLD, GERD, severe AS s/p TAVR 01/19/14 (Dr. Burt Knack), chronic diastolic CHF, anxiety, arthritis, exertional dyspnea. PCP is Dr. Parks Ranger. Primary cardiologist is Dr. Sallyanne Kuster, but has most recently been followed by Dr. Burt Knack who performed her TAVR.  He cleared her for this procedure at low cardiac risk with permission to hold ASA and Plavix 5-7 days prior to surgery as necessary. If Xarelto is started post-operatively for DVT prophylaxis then he would not resume Plavix.   Meds include amlodipine, Coreg, Plavix, Lofibra, Flonase, HCTZ, Prilosec.  02/02/14 EKG: NSr, possible LAE.   Echo on 03/16/13:  - Left ventricle: The cavity size was normal. There was mild concentric hypertrophy with moderate basal septal hypertrophy. Systolic function was vigorous. The estimated ejection fraction was in the range of 65% to 70%. Wall motion was normal; there were no regional wall motion abnormalities. Doppler parameters are consistent with abnormal left ventricular relaxation (grade 1 diastolic dysfunction). Doppler parameters are consistent with high ventricular filling pressure. - Aortic valve: TAVR: Sapien -8mm placed on 01/19/14. Bioprosthetic valve. There was no perivalvular leak. Valve is functioning normally. Peak velocity (S): 265 cm/s. - Mitral valve: Severely calcified annulus. Mildly thickened leaflets . Leaflet separation was reduced. The findings are consistent with mild stenosis. Mean gradient (D): 7 mm Hg. - Left atrium: The atrium was moderately dilated. Impressions: Compared to the prior study  (01/20/14), there has been no significant interval change.  Cardiac cath on 12/08/13: IMPRESSIONS:  Severe aortic stenosis. Mild coronary atherosclerosis, but without significant stenoses. Elevated LVEDP, mild pulmonary  artery HTN. RECOMMENDATION: Aortic valve replacement with a biological prosthesis.  01/20/14 1V CXR: Mild bibasilar atelectasis.  Preoperative labs noted. Urine culture is pending.  If no acute changes then I would anticipate that she could proceed as planned.  George Hugh [facility] Short Stay Center/Anesthesiology Phone [phone] 04/20/2014 4:44 PM

## 2014-04-27 MED ORDER — CEFAZOLIN SODIUM-DEXTROSE 2-3 GM-% IV SOLR
2.0000 g | INTRAVENOUS | Status: AC
  Administered 2014-04-28: 2 g via INTRAVENOUS
  Filled 2014-04-27: qty 50

## 2014-04-28 ENCOUNTER — Inpatient Hospital Stay (HOSPITAL_COMMUNITY): Payer: Medicare Other

## 2014-04-28 ENCOUNTER — Inpatient Hospital Stay (HOSPITAL_COMMUNITY)
Admission: RE | Admit: 2014-04-28 | Discharge: 2014-05-01 | DRG: 470 | Disposition: A | Discharge status: Skilled Nursing Facility | Payer: Medicare Other | Source: Ambulatory Visit | Attending: Orthopedic Surgery | Admitting: Orthopedic Surgery

## 2014-04-28 ENCOUNTER — Encounter (HOSPITAL_COMMUNITY): Payer: Self-pay | Admitting: Certified Registered Nurse Anesthetist

## 2014-04-28 ENCOUNTER — Inpatient Hospital Stay (HOSPITAL_COMMUNITY): Payer: Medicare Other | Admitting: Certified Registered Nurse Anesthetist

## 2014-04-28 ENCOUNTER — Encounter (HOSPITAL_COMMUNITY)
Admission: RE | Disposition: A | Discharge status: Skilled Nursing Facility | Payer: Self-pay | Source: Ambulatory Visit | Attending: Orthopedic Surgery

## 2014-04-28 ENCOUNTER — Inpatient Hospital Stay (HOSPITAL_COMMUNITY): Payer: Medicare Other | Admitting: Vascular Surgery

## 2014-04-28 DIAGNOSIS — Z96659 Presence of unspecified artificial knee joint: Secondary | ICD-10-CM

## 2014-04-28 DIAGNOSIS — Z7902 Long term (current) use of antithrombotics/antiplatelets: Secondary | ICD-10-CM

## 2014-04-28 DIAGNOSIS — I10 Essential (primary) hypertension: Secondary | ICD-10-CM | POA: Diagnosis present

## 2014-04-28 DIAGNOSIS — E785 Hyperlipidemia, unspecified: Secondary | ICD-10-CM | POA: Diagnosis present

## 2014-04-28 DIAGNOSIS — M1710 Unilateral primary osteoarthritis, unspecified knee: Secondary | ICD-10-CM | POA: Diagnosis present

## 2014-04-28 DIAGNOSIS — Z96652 Presence of left artificial knee joint: Secondary | ICD-10-CM

## 2014-04-28 DIAGNOSIS — M810 Age-related osteoporosis without current pathological fracture: Secondary | ICD-10-CM | POA: Diagnosis present

## 2014-04-28 DIAGNOSIS — M179 Osteoarthritis of knee, unspecified: Secondary | ICD-10-CM | POA: Diagnosis present

## 2014-04-28 DIAGNOSIS — Z952 Presence of prosthetic heart valve: Secondary | ICD-10-CM | POA: Diagnosis not present

## 2014-04-28 DIAGNOSIS — I5032 Chronic diastolic (congestive) heart failure: Secondary | ICD-10-CM | POA: Diagnosis present

## 2014-04-28 DIAGNOSIS — M25561 Pain in right knee: Secondary | ICD-10-CM | POA: Diagnosis present

## 2014-04-28 DIAGNOSIS — K219 Gastro-esophageal reflux disease without esophagitis: Secondary | ICD-10-CM | POA: Diagnosis present

## 2014-04-28 DIAGNOSIS — M1711 Unilateral primary osteoarthritis, right knee: Principal | ICD-10-CM | POA: Diagnosis present

## 2014-04-28 DIAGNOSIS — D62 Acute posthemorrhagic anemia: Secondary | ICD-10-CM | POA: Diagnosis not present

## 2014-04-28 HISTORY — PX: TOTAL KNEE ARTHROPLASTY: SHX125

## 2014-04-28 SURGERY — ARTHROPLASTY, KNEE, TOTAL
Anesthesia: General | Site: Knee | Laterality: Right

## 2014-04-28 MED ORDER — ACETAMINOPHEN 325 MG PO TABS
650.0000 mg | ORAL_TABLET | Freq: Four times a day (QID) | ORAL | Status: DC | PRN
  Administered 2014-04-30 – 2014-05-01 (×2): 650 mg via ORAL
  Filled 2014-04-28 (×2): qty 2

## 2014-04-28 MED ORDER — METHOCARBAMOL 1000 MG/10ML IJ SOLN
500.0000 mg | Freq: Four times a day (QID) | INTRAVENOUS | Status: DC | PRN
  Filled 2014-04-28: qty 5

## 2014-04-28 MED ORDER — ONDANSETRON HCL 4 MG/2ML IJ SOLN
INTRAMUSCULAR | Status: AC
  Filled 2014-04-28: qty 2

## 2014-04-28 MED ORDER — METOCLOPRAMIDE HCL 10 MG PO TABS: 5.0000 mg | ORAL_TABLET | Freq: Three times a day (TID) | ORAL | Status: DC | PRN

## 2014-04-28 MED ORDER — FENOFIBRATE 160 MG PO TABS
160.0000 mg | ORAL_TABLET | Freq: Every day | ORAL | Status: DC
  Administered 2014-04-30 – 2014-05-01 (×2): 160 mg via ORAL
  Filled 2014-04-28 (×2): qty 1

## 2014-04-28 MED ORDER — LIDOCAINE HCL (CARDIAC) 20 MG/ML IV SOLN
INTRAVENOUS | Status: AC
  Filled 2014-04-28: qty 5

## 2014-04-28 MED ORDER — HYDROMORPHONE HCL 1 MG/ML IJ SOLN
INTRAMUSCULAR | Status: AC
  Filled 2014-04-28: qty 1

## 2014-04-28 MED ORDER — 0.9 % SODIUM CHLORIDE (POUR BTL) OPTIME
TOPICAL | Status: DC | PRN
  Administered 2014-04-28: 1000 mL

## 2014-04-28 MED ORDER — PHENOL 1.4 % MT LIQD: 1.0000 | OROMUCOSAL | Status: DC | PRN

## 2014-04-28 MED ORDER — MORPHINE SULFATE 15 MG PO TABS: 15.0000 mg | ORAL_TABLET | ORAL | Status: DC | PRN

## 2014-04-28 MED ORDER — BUPIVACAINE LIPOSOME 1.3 % IJ SUSP
INTRAMUSCULAR | Status: DC | PRN
  Administered 2014-04-28: 20 mL

## 2014-04-28 MED ORDER — HYDRALAZINE HCL 20 MG/ML IJ SOLN
INTRAMUSCULAR | Status: DC | PRN
  Administered 2014-04-28: 10 mg via INTRAVENOUS

## 2014-04-28 MED ORDER — DIPHENHYDRAMINE HCL 12.5 MG/5ML PO ELIX: 12.5000 mg | ORAL_SOLUTION | ORAL | Status: DC | PRN

## 2014-04-28 MED ORDER — MENTHOL 3 MG MT LOZG: 1.0000 | LOZENGE | OROMUCOSAL | Status: DC | PRN

## 2014-04-28 MED ORDER — ROCURONIUM BROMIDE 100 MG/10ML IV SOLN
INTRAVENOUS | Status: DC | PRN
  Administered 2014-04-28: 30 mg via INTRAVENOUS

## 2014-04-28 MED ORDER — CHLORHEXIDINE GLUCONATE 4 % EX LIQD
60.0000 mL | Freq: Once | CUTANEOUS | Status: DC
  Filled 2014-04-28: qty 60

## 2014-04-28 MED ORDER — BUPIVACAINE HCL 0.5 % IJ SOLN
INTRAMUSCULAR | Status: DC | PRN
  Administered 2014-04-28: 10 mL

## 2014-04-28 MED ORDER — LIDOCAINE HCL (CARDIAC) 20 MG/ML IV SOLN
INTRAVENOUS | Status: DC | PRN
  Administered 2014-04-28: 70 mg via INTRAVENOUS

## 2014-04-28 MED ORDER — CEFAZOLIN SODIUM 1-5 GM-% IV SOLN
1.0000 g | Freq: Four times a day (QID) | INTRAVENOUS | Status: AC
  Administered 2014-04-29: 1 g via INTRAVENOUS
  Filled 2014-04-28 (×2): qty 50

## 2014-04-28 MED ORDER — SODIUM CHLORIDE 0.9 % IJ SOLN
INTRAMUSCULAR | Status: DC | PRN
  Administered 2014-04-28: 40 mL

## 2014-04-28 MED ORDER — FENTANYL CITRATE 0.05 MG/ML IJ SOLN
INTRAMUSCULAR | Status: DC | PRN
  Administered 2014-04-28: 25 ug via INTRAVENOUS
  Administered 2014-04-28 (×2): 50 ug via INTRAVENOUS
  Administered 2014-04-28: 25 ug via INTRAVENOUS
  Administered 2014-04-28: 50 ug via INTRAVENOUS

## 2014-04-28 MED ORDER — GLYCOPYRROLATE 0.2 MG/ML IJ SOLN
INTRAMUSCULAR | Status: DC | PRN
  Administered 2014-04-28: 0.4 mg via INTRAVENOUS

## 2014-04-28 MED ORDER — ONDANSETRON HCL 4 MG PO TABS: 4.0000 mg | ORAL_TABLET | Freq: Three times a day (TID) | ORAL | Status: DC | PRN

## 2014-04-28 MED ORDER — SODIUM CHLORIDE 0.9 % IR SOLN
Status: DC | PRN
  Administered 2014-04-28: 1000 mL

## 2014-04-28 MED ORDER — BISACODYL 5 MG PO TBEC: 5.0000 mg | DELAYED_RELEASE_TABLET | Freq: Every day | ORAL | Status: DC | PRN

## 2014-04-28 MED ORDER — DOCUSATE SODIUM 100 MG PO CAPS
100.0000 mg | ORAL_CAPSULE | Freq: Two times a day (BID) | ORAL | Status: DC
  Administered 2014-04-28 – 2014-05-01 (×5): 100 mg via ORAL
  Filled 2014-04-28 (×6): qty 1

## 2014-04-28 MED ORDER — PROMETHAZINE HCL 25 MG/ML IJ SOLN: 6.2500 mg | INTRAMUSCULAR | Status: DC | PRN

## 2014-04-28 MED ORDER — ONDANSETRON HCL 4 MG/2ML IJ SOLN
4.0000 mg | Freq: Four times a day (QID) | INTRAMUSCULAR | Status: DC | PRN
  Administered 2014-04-28 – 2014-04-29 (×3): 4 mg via INTRAVENOUS
  Filled 2014-04-28 (×3): qty 2

## 2014-04-28 MED ORDER — AMLODIPINE BESYLATE 10 MG PO TABS
10.0000 mg | ORAL_TABLET | Freq: Every day | ORAL | Status: DC
  Administered 2014-04-30 – 2014-05-01 (×2): 10 mg via ORAL
  Filled 2014-04-28 (×2): qty 1

## 2014-04-28 MED ORDER — FENTANYL CITRATE 0.05 MG/ML IJ SOLN
INTRAMUSCULAR | Status: AC
  Filled 2014-04-28: qty 5

## 2014-04-28 MED ORDER — EPHEDRINE SULFATE 50 MG/ML IJ SOLN
INTRAMUSCULAR | Status: DC | PRN
  Administered 2014-04-28: 5 mg via INTRAVENOUS

## 2014-04-28 MED ORDER — HYDROCHLOROTHIAZIDE 25 MG PO TABS
12.5000 mg | ORAL_TABLET | Freq: Every day | ORAL | Status: DC
  Administered 2014-04-30 – 2014-05-01 (×2): 12.5 mg via ORAL
  Filled 2014-04-28 (×2): qty 1

## 2014-04-28 MED ORDER — DEXAMETHASONE SODIUM PHOSPHATE 10 MG/ML IJ SOLN
10.0000 mg | Freq: Once | INTRAMUSCULAR | Status: AC
  Administered 2014-04-29: 10 mg via INTRAVENOUS
  Filled 2014-04-28: qty 1

## 2014-04-28 MED ORDER — LACTATED RINGERS IV SOLN
INTRAVENOUS | Status: DC | PRN
  Administered 2014-04-28 (×2): via INTRAVENOUS

## 2014-04-28 MED ORDER — HYDROMORPHONE HCL 1 MG/ML IJ SOLN
0.2500 mg | INTRAMUSCULAR | Status: DC | PRN
  Administered 2014-04-28 (×4): 0.25 mg via INTRAVENOUS

## 2014-04-28 MED ORDER — ROCURONIUM BROMIDE 50 MG/5ML IV SOLN
INTRAVENOUS | Status: AC
  Filled 2014-04-28: qty 1

## 2014-04-28 MED ORDER — PANTOPRAZOLE SODIUM 40 MG PO TBEC
80.0000 mg | DELAYED_RELEASE_TABLET | Freq: Every day | ORAL | Status: DC
  Administered 2014-04-29 – 2014-05-01 (×3): 80 mg via ORAL
  Filled 2014-04-28 (×3): qty 2

## 2014-04-28 MED ORDER — MORPHINE SULFATE 15 MG PO TABS
15.0000 mg | ORAL_TABLET | ORAL | Status: DC | PRN
  Administered 2014-04-28 – 2014-04-29 (×2): 15 mg via ORAL
  Filled 2014-04-28 (×2): qty 1

## 2014-04-28 MED ORDER — FLUTICASONE PROPIONATE 50 MCG/ACT NA SUSP
1.0000 | Freq: Two times a day (BID) | NASAL | Status: DC | PRN
Start: 2014-04-28 — End: 2014-05-01

## 2014-04-28 MED ORDER — NEOSTIGMINE METHYLSULFATE 10 MG/10ML IV SOLN
INTRAVENOUS | Status: DC | PRN
  Administered 2014-04-28: 3 mg via INTRAVENOUS

## 2014-04-28 MED ORDER — ACETAMINOPHEN 650 MG RE SUPP: 650.0000 mg | Freq: Four times a day (QID) | RECTAL | Status: DC | PRN

## 2014-04-28 MED ORDER — PROPOFOL 10 MG/ML IV BOLUS
INTRAVENOUS | Status: DC | PRN
  Administered 2014-04-28: 90 mg via INTRAVENOUS

## 2014-04-28 MED ORDER — HYDROMORPHONE HCL 1 MG/ML IJ SOLN
0.5000 mg | INTRAMUSCULAR | Status: DC | PRN
  Administered 2014-04-28: 1 mg via INTRAVENOUS
  Administered 2014-04-28: 0.5 mg via INTRAVENOUS
  Administered 2014-04-28 – 2014-04-29 (×3): 1 mg via INTRAVENOUS
  Filled 2014-04-28 (×4): qty 1

## 2014-04-28 MED ORDER — METHOCARBAMOL 500 MG PO TABS
500.0000 mg | ORAL_TABLET | Freq: Four times a day (QID) | ORAL | Status: DC | PRN
  Administered 2014-05-01 (×2): 500 mg via ORAL
  Filled 2014-04-28 (×2): qty 1

## 2014-04-28 MED ORDER — BUPIVACAINE LIPOSOME 1.3 % IJ SUSP
20.0000 mL | INTRAMUSCULAR | Status: DC
  Filled 2014-04-28: qty 20

## 2014-04-28 MED ORDER — PROPOFOL 10 MG/ML IV BOLUS
INTRAVENOUS | Status: AC
  Filled 2014-04-28: qty 20

## 2014-04-28 MED ORDER — METOCLOPRAMIDE HCL 5 MG/ML IJ SOLN
5.0000 mg | Freq: Three times a day (TID) | INTRAMUSCULAR | Status: DC | PRN
  Administered 2014-04-29: 10 mg via INTRAVENOUS
  Filled 2014-04-28 (×2): qty 2

## 2014-04-28 MED ORDER — POTASSIUM CHLORIDE IN NACL 20-0.9 MEQ/L-% IV SOLN
INTRAVENOUS | Status: DC
  Administered 2014-04-28 – 2014-04-29 (×2): via INTRAVENOUS
  Filled 2014-04-28 (×6): qty 1000

## 2014-04-28 MED ORDER — ONDANSETRON HCL 4 MG PO TABS: 4.0000 mg | ORAL_TABLET | Freq: Four times a day (QID) | ORAL | Status: DC | PRN

## 2014-04-28 MED ORDER — LACTATED RINGERS IV SOLN
INTRAVENOUS | Status: DC
  Administered 2014-04-28: 10:00:00 via INTRAVENOUS

## 2014-04-28 MED ORDER — ONDANSETRON HCL 4 MG/2ML IJ SOLN
INTRAMUSCULAR | Status: DC | PRN
  Administered 2014-04-28: 4 mg via INTRAVENOUS

## 2014-04-28 MED ORDER — CLOPIDOGREL BISULFATE 75 MG PO TABS
75.0000 mg | ORAL_TABLET | Freq: Every day | ORAL | Status: DC
  Administered 2014-04-29 – 2014-05-01 (×3): 75 mg via ORAL
  Filled 2014-04-28 (×3): qty 1

## 2014-04-28 MED ORDER — CARVEDILOL 12.5 MG PO TABS
12.5000 mg | ORAL_TABLET | Freq: Two times a day (BID) | ORAL | Status: DC
  Administered 2014-04-28 – 2014-05-01 (×5): 12.5 mg via ORAL
  Filled 2014-04-28 (×6): qty 1

## 2014-04-28 MED ORDER — GLYCOPYRROLATE 0.2 MG/ML IJ SOLN
INTRAMUSCULAR | Status: AC
  Filled 2014-04-28: qty 2

## 2014-04-28 MED ORDER — HYDRALAZINE HCL 20 MG/ML IJ SOLN
INTRAMUSCULAR | Status: AC
  Filled 2014-04-28: qty 1

## 2014-04-28 SURGICAL SUPPLY — 68 items
BANDAGE ELASTIC 4 VELCRO ST LF (GAUZE/BANDAGES/DRESSINGS) ×3 IMPLANT
BANDAGE ELASTIC 6 VELCRO ST LF (GAUZE/BANDAGES/DRESSINGS) ×3 IMPLANT
BANDAGE ESMARK 6X9 LF (GAUZE/BANDAGES/DRESSINGS) ×1 IMPLANT
BENZOIN TINCTURE PRP APPL 2/3 (GAUZE/BANDAGES/DRESSINGS) ×3 IMPLANT
BLADE SAG 18X100X1.27 (BLADE) ×6 IMPLANT
BNDG ESMARK 6X9 LF (GAUZE/BANDAGES/DRESSINGS) ×3
BOWL SMART MIX CTS (DISPOSABLE) ×3 IMPLANT
CAPT KNEE TOTAL 3 ×3 IMPLANT
CEMENT BONE SIMPLEX SPEEDSET (Cement) ×6 IMPLANT
CLOSURE WOUND 1/2 X4 (GAUZE/BANDAGES/DRESSINGS) ×1
COVER SURGICAL LIGHT HANDLE (MISCELLANEOUS) ×3 IMPLANT
CUFF TOURNIQUET SINGLE 34IN LL (TOURNIQUET CUFF) ×3 IMPLANT
DRAPE EXTREMITY T 121X128X90 (DRAPE) ×3 IMPLANT
DRAPE IMP U-DRAPE 54X76 (DRAPES) ×3 IMPLANT
DRAPE PROXIMA HALF (DRAPES) ×3 IMPLANT
DRAPE U-SHAPE 47X51 STRL (DRAPES) ×3 IMPLANT
DRSG PAD ABDOMINAL 8X10 ST (GAUZE/BANDAGES/DRESSINGS) ×3 IMPLANT
DURAPREP 26ML APPLICATOR (WOUND CARE) ×6 IMPLANT
ELECT CAUTERY BLADE 6.4 (BLADE) ×6 IMPLANT
ELECT REM PT RETURN 9FT ADLT (ELECTROSURGICAL) ×3
ELECTRODE REM PT RTRN 9FT ADLT (ELECTROSURGICAL) ×1 IMPLANT
EVACUATOR 1/8 PVC DRAIN (DRAIN) ×3 IMPLANT
FACESHIELD WRAPAROUND (MASK) ×6 IMPLANT
GAUZE SPONGE 4X4 12PLY STRL (GAUZE/BANDAGES/DRESSINGS) ×3 IMPLANT
GLOVE BIOGEL PI IND STRL 7.0 (GLOVE) ×2 IMPLANT
GLOVE BIOGEL PI INDICATOR 7.0 (GLOVE) ×4
GLOVE ECLIPSE 6.5 STRL STRAW (GLOVE) ×6 IMPLANT
GLOVE ORTHO TXT STRL SZ7.5 (GLOVE) ×3 IMPLANT
GOWN STRL REUS W/ TWL LRG LVL3 (GOWN DISPOSABLE) ×2 IMPLANT
GOWN STRL REUS W/ TWL XL LVL3 (GOWN DISPOSABLE) ×1 IMPLANT
GOWN STRL REUS W/TWL LRG LVL3 (GOWN DISPOSABLE) ×4
GOWN STRL REUS W/TWL XL LVL3 (GOWN DISPOSABLE) ×2
HANDPIECE INTERPULSE COAX TIP (DISPOSABLE) ×2
IMMOBILIZER KNEE 22 (SOFTGOODS) ×3 IMPLANT
IMMOBILIZER KNEE 22 UNIV (SOFTGOODS) ×3 IMPLANT
IMMOBILIZER KNEE 24 THIGH 36 (MISCELLANEOUS) IMPLANT
IMMOBILIZER KNEE 24 UNIV (MISCELLANEOUS)
KIT BASIN OR (CUSTOM PROCEDURE TRAY) ×3 IMPLANT
KIT ROOM TURNOVER OR (KITS) ×3 IMPLANT
MANIFOLD NEPTUNE II (INSTRUMENTS) ×3 IMPLANT
NEEDLE 18GX1X1/2 (RX/OR ONLY) (NEEDLE) ×3 IMPLANT
NEEDLE HYPO 25GX1X1/2 BEV (NEEDLE) ×3 IMPLANT
NS IRRIG 1000ML POUR BTL (IV SOLUTION) ×3 IMPLANT
PACK TOTAL JOINT (CUSTOM PROCEDURE TRAY) ×3 IMPLANT
PACK UNIVERSAL I (CUSTOM PROCEDURE TRAY) ×3 IMPLANT
PAD ARMBOARD 7.5X6 YLW CONV (MISCELLANEOUS) ×6 IMPLANT
PADDING CAST ABS 4INX4YD NS (CAST SUPPLIES) ×2
PADDING CAST ABS 6INX4YD NS (CAST SUPPLIES) ×2
PADDING CAST ABS COTTON 4X4 ST (CAST SUPPLIES) ×1 IMPLANT
PADDING CAST ABS COTTON 6X4 NS (CAST SUPPLIES) ×1 IMPLANT
PENCIL BUTTON HOLSTER BLD 10FT (ELECTRODE) ×3 IMPLANT
SET HNDPC FAN SPRY TIP SCT (DISPOSABLE) ×1 IMPLANT
SPONGE GAUZE 4X4 12PLY STER LF (GAUZE/BANDAGES/DRESSINGS) ×3 IMPLANT
STRIP CLOSURE SKIN 1/2X4 (GAUZE/BANDAGES/DRESSINGS) ×2 IMPLANT
SUCTION FRAZIER TIP 10 FR DISP (SUCTIONS) ×3 IMPLANT
SUT ETHIBOND NAB CT1 #1 30IN (SUTURE) ×3 IMPLANT
SUT MNCRL AB 4-0 PS2 18 (SUTURE) ×3 IMPLANT
SUT VIC AB 0 CT1 27 (SUTURE)
SUT VIC AB 0 CT1 27XBRD ANBCTR (SUTURE) IMPLANT
SUT VIC AB 1 CT1 27 (SUTURE) ×4
SUT VIC AB 1 CT1 27XBRD ANBCTR (SUTURE) ×2 IMPLANT
SUT VIC AB 2-0 CT1 27 (SUTURE) ×4
SUT VIC AB 2-0 CT1 TAPERPNT 27 (SUTURE) ×2 IMPLANT
SYR 50ML LL SCALE MARK (SYRINGE) ×3 IMPLANT
SYR CONTROL 10ML LL (SYRINGE) ×3 IMPLANT
TOWEL OR 17X24 6PK STRL BLUE (TOWEL DISPOSABLE) ×3 IMPLANT
TOWEL OR 17X26 10 PK STRL BLUE (TOWEL DISPOSABLE) ×3 IMPLANT
WATER STERILE IRR 1000ML POUR (IV SOLUTION) ×6 IMPLANT

## 2014-04-28 NOTE — H&P (View-Only) (Signed)
TOTAL KNEE ADMISSION H&P  Patient is being admitted for right total knee arthroplasty.  Subjective:  Chief Complaint:right knee pain.  HPI: [name], 81 y.o. female, has a history of pain and functional disability in the right knee due to arthritis and has failed non-surgical conservative treatments for greater than 12 weeks to includeNSAID's and/or analgesics and corticosteriod injections.  Onset of symptoms was gradual, starting 2 years ago with rapidlly worsening course since that time. The patient noted prior procedures on the knee to include  arthroscopy and menisectomy on the right knee(s).  Patient currently rates pain in the right knee(s) at 8 out of 10 with activity. Patient has night pain, worsening of pain with activity and weight bearing, pain that interferes with activities of daily living and joint swelling.  Patient has evidence of subchondral sclerosis, periarticular osteophytes and joint space narrowing by imaging studies. There is no active infection.  Patient Active Problem List   Diagnosis Date Noted  . 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 Severe aortic valve stenosis 01/19/2014  . Aortic stenosis 12/14/2013  . Arthritis   . Chronic diastolic congestive heart failure, NYHA class 2   . Severe aortic stenosis 12/06/2013  . Essential hypertension 11/18/2013  . Hyperlipidemia 11/18/2013  . Aortic heart murmur 11/18/2013  . Preoperative clearance 11/18/2013  . GERD (gastroesophageal reflux disease) 05/14/2011  . Dysphagia 05/14/2011   Past Medical History  Diagnosis Date  . Hypertension   . Hyperlipidemia   . GERD (gastroesophageal reflux disease) 05/14/2011  . Arthritis     Both knees R>L  . Chronic diastolic congestive heart failure, NYHA class 2   . Severe aortic stenosis 12/06/2013  . Heart murmur   . Shortness of breath dyspnea   . Anxiety   . 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 Cholecystectomy  2008  . Tonsillectomy      70 years ago  . Refractive surgery Bilateral   . Cataract extraction Bilateral   . Knee arthroscopy Bilateral   . Transcatheter aortic valve replacement, transfemoral N/A 01/19/2014    Procedure: TRANSCATHETER AORTIC VALVE REPLACEMENT, TRANSFEMORAL;  Surgeon: Sherren Mocha, MD;  Location: [place];  Service: Open Heart Surgery;  Laterality: N/A;  . Intraoperative transesophageal echocardiogram N/A 01/19/2014    Procedure: INTRAOPERATIVE TRANSESOPHAGEAL ECHOCARDIOGRAM;  Surgeon: Sherren Mocha, MD;  Location: [facility] OR;  Service: Open Heart Surgery;  Laterality: N/A;  . Left and right heart catheterization with coronary angiogram N/A 12/08/2013    Procedure: LEFT AND RIGHT HEART CATHETERIZATION WITH CORONARY ANGIOGRAM;  Surgeon: Sanda Klein, MD;  Location: [place] CATH LAB;  Service: Cardiovascular;  Laterality: N/A;     (Not in a hospital admission) Allergies  Allergen Reactions  . Aspirin Nausea And Vomiting  . Codeine Nausea And Vomiting    History  Substance Use Topics  . Smoking status: Never Smoker   . Smokeless tobacco: Not on file  . Alcohol Use: No    Family History  Problem Relation Age of Onset  . Breast cancer Sister     2 sisters  . Colon cancer Neg Hx   . Muscular dystrophy Father     Died at 43     Review of Systems  Constitutional: Negative.   HENT: Positive for hearing loss.   Eyes: Negative.   Respiratory:  Negative.   Cardiovascular: Negative.   Gastrointestinal: Negative.   Genitourinary: Negative.   Musculoskeletal: Positive for joint pain.  Skin: Negative.   Neurological: Negative.  Negative for headaches.  Endo/Heme/Allergies: Negative.   Psychiatric/Behavioral: Negative.     Objective:  Physical Exam  Constitutional: She is oriented to person, place, and time. She appears  well-developed and well-nourished.  HENT:  Head: Normocephalic and atraumatic.  Eyes: EOM are normal. Pupils are equal, round, and reactive to light.  Neck: Normal range of motion. Neck supple.  Cardiovascular: Normal rate and regular rhythm.   Respiratory: Effort normal and breath sounds normal. No respiratory distress. She has no wheezes. She has no rales.  GI: Soft. Bowel sounds are normal.  Musculoskeletal:  Exam of her right knee reveals trace effusion range of motion 5-95 degrees.  Varus thrust. She is neurovascularly intact distally.  Neurological: She is alert and oriented to person, place, and time.  Skin: Skin is warm and dry.  Psychiatric: She has a normal mood and affect. Her behavior is normal. Judgment and thought content normal.    Vital signs in last 24 hours: @VSRANGES @  Labs:   Estimated body mass index is 25.05 kg/(m^2) as calculated from the following:   Height as of 03/16/14: 5\' 4"  (1.626 m).   Weight as of 03/16/14: 66.225 kg (146 lb).   Imaging Review Plain radiographs demonstrate severe degenerative joint disease of the right knee(s). The overall alignment ismild varus. The bone quality appears to be fair for age and reported activity level.  Assessment/Plan:  End stage arthritis, right knee   The patient history, physical examination, clinical judgment of the provider and imaging studies are consistent with end stage degenerative joint disease of the right knee(s) and total knee arthroplasty is deemed medically necessary. The treatment options including medical management, injection therapy arthroscopy and arthroplasty were discussed at length. The risks and benefits of total knee arthroplasty were presented and reviewed. The risks due to aseptic loosening, infection, stiffness, patella tracking problems, thromboembolic complications and other imponderables were discussed. The patient acknowledged the explanation, agreed to proceed with the plan and consent was  signed. Patient is being admitted for inpatient treatment for surgery, pain control, PT, OT, prophylactic antibiotics, VTE prophylaxis, progressive ambulation and ADL's and discharge planning. The patient is planning to be discharged to skilled nursing facility

## 2014-04-28 NOTE — Anesthesia Procedure Notes (Addendum)
Procedure Name: Intubation Date/Time: 04/28/2014 11:27 AM Performed by: Garrison Columbus T Pre-anesthesia Checklist: Patient identified, Emergency Drugs available, Suction available, Patient being monitored and Timeout performed Patient Re-evaluated:Patient Re-evaluated prior to inductionOxygen Delivery Method: Circle system utilized Preoxygenation: Pre-oxygenation with 100% oxygen Intubation Type: IV induction Ventilation: Mask ventilation without difficulty Laryngoscope Size: Mac and 3 Grade View: Grade I Tube type: Oral Tube size: 7.5 mm Number of attempts: 1 Airway Equipment and Method: Stylet Placement Confirmation: ETT inserted through vocal cords under direct vision,  positive ETCO2 and breath sounds checked- equal and bilateral Secured at: 21 cm Tube secured with: Tape Dental Injury: Teeth and Oropharynx as per pre-operative assessment  Comments: AOI per E. Gilford Rile, Immunologist.

## 2014-04-28 NOTE — Progress Notes (Signed)
Utilization review completed.  

## 2014-04-28 NOTE — Evaluation (Signed)
Physical Therapy Evaluation Patient Details Name: [name] MRN: [id number] DOB: 1925-09-02 Today's Date: 04/28/2014   History of Present Illness  81 y.o. female admitted to [facility] on 04/28/14 s/p elective R TKA.  She has significant PMHx of HTN, CHF, aortic stenosis, SOB, anxiety, s/p transcatheter aortic valve replacement 01/2014), and Bil knee arthroscopy.  Pt self reports HOH.   Clinical Impression  Pt is POD #0 and is moving well, however, she is severely limited by nausea this PM.  She has already been given IV nausea meds by RN without much relief.  Pt is appropriate for SNF level rehab at discharge with her current physical presentation and knowing that she lives alone she would need to be modified independent with mobility and ADLs to go home safely.   PT to follow acutely for deficits listed below.       Follow Up Recommendations SNF (asking for [facility])    Equipment Recommendations  None recommended by PT    Recommendations for Other Services   NA    Precautions / Restrictions Precautions Precautions: Knee Precaution Booklet Issued: Yes (comment) Precaution Comments: handout given, but not reviewed Required Braces or Orthoses: Knee Immobilizer - Right Knee Immobilizer - Right: Other (comment) (no order, but used for POD #1 due to decreased sensation ) Restrictions Weight Bearing Restrictions: Yes RLE Weight Bearing: Weight bearing as tolerated      Mobility  Bed Mobility Overal bed mobility: Needs Assistance Bed Mobility: Supine to Sit;Sit to Supine     Supine to sit: Min assist Sit to supine: Mod assist   General bed mobility comments: Min assist to help progress right leg over EOB and help pt find railing to pull up to sitting.  Mod assist to help lift both legs back into the bed to get to supine.   Transfers Overall transfer level: Needs assistance Equipment used: Rolling walker (2 wheeled) Transfers: Stand Pivot Transfers;Sit to/from Stand Sit to  Stand: Mod assist Stand pivot transfers: Mod assist       General transfer comment: Min assist to stand from both bed and BSC with cues for Upper extremity placement.  Pt needed support at trunk during transitions.   Ambulation/Gait             General Gait Details: NT due to significant nausea         Balance Overall balance assessment: Needs assistance Sitting-balance support: Feet supported;Bilateral upper extremity supported;No upper extremity supported;Single extremity supported Sitting balance-Leahy Scale: Fair Sitting balance - Comments: Pt sat EOB for a significant amount of time and dry heaved as well as productive vomit.  She had already had IV meds for nausea.     Standing balance support: Bilateral upper extremity supported Standing balance-Leahy Scale: Poor                               Pertinent Vitals/Pain Pain Assessment: No/denies pain    Home Living Family/patient expects to be discharged to:: Skilled nursing facility ([place]) Living Arrangements: Alone                          Hand Dominance   Dominant Hand: Right    Extremity/Trunk Assessment   Upper Extremity Assessment: Defer to OT evaluation           Lower Extremity Assessment: RLE deficits/detail RLE Deficits / Details: right leg with normal post op  pain and weakness.  Ankle 3/5, knee 3-/5, hip 3-/5 flexion.  Pt able to preform SLR supine in bed unassisted.     Cervical / Trunk Assessment: Normal  Communication   Communication: HOH  Cognition Arousal/Alertness: Lethargic Behavior During Therapy: WFL for tasks assessed/performed Overall Cognitive Status: Within Functional Limits for tasks assessed                               Assessment/Plan    PT Assessment Patient needs continued PT services  PT Diagnosis Difficulty walking;Abnormality of gait;Generalized weakness;Acute pain   PT Problem List Decreased strength;Decreased range of  motion;Decreased activity tolerance;Decreased balance;Decreased mobility;Decreased knowledge of use of DME;Decreased knowledge of precautions;Pain  PT Treatment Interventions DME instruction;Gait training;Functional mobility training;Stair training;Therapeutic activities;Therapeutic exercise;Balance training;Neuromuscular re-education;Patient/family education;Modalities;Manual techniques   PT Goals (Current goals can be found in the Care Plan section) Acute Rehab PT Goals Patient Stated Goal: to decrease nausea PT Goal Formulation: With patient Time For Goal Achievement: 05/05/14 Potential to Achieve Goals: Good    Frequency 7X/week   Barriers to discharge Decreased caregiver support pt lives alone       End of Session Equipment Utilized During Treatment: Right knee immobilizer Activity Tolerance: Other (comment) (limited by nausea) Patient left: in bed;with call bell/phone within reach;with family/visitor present;with nursing/sitter in room           Time: 1659-1750 PT Time Calculation (min) (ACUTE ONLY): 51 min   Charges:   PT Evaluation $Initial PT Evaluation Tier I: 1 Procedure PT Treatments $Therapeutic Activity: 23-37 mins        Jaiyah Beining B. Terricka Onofrio, PT, DPT [phone]   04/28/2014, 6:00 PM

## 2014-04-28 NOTE — Progress Notes (Signed)
Orthopedic Tech Progress Note Patient Details:  [place] CONTRERAZ 07-16-1925 [id number] Applied CPM to RLE.  Applied OHF with trapeze to pt.'s bed.  Left Bone Foam with pt.'s nurse. CPM Right Knee CPM Right Knee: On Right Knee Flexion (Degrees): 90 Right Knee Extension (Degrees): 0   Darrol Poke 04/28/2014, 2:17 PM

## 2014-04-28 NOTE — Anesthesia Postprocedure Evaluation (Signed)
  Anesthesia Post-op Note  Patient: [name]  Procedure(s) Performed: Procedure(s): TOTAL KNEE ARTHROPLASTY (Right)  Patient Location: PACU  Anesthesia Type:General  Level of Consciousness: awake and alert   Airway and Oxygen Therapy: Patient Spontanous Breathing and Patient connected to nasal cannula oxygen  Post-op Pain: mild  Post-op Assessment: Post-op Vital signs reviewed  Post-op Vital Signs: Reviewed  Last Vitals:  Filed Vitals:   04/28/14 1315  BP:   Pulse: 76  Temp:   Resp: 20    Complications: No apparent anesthesia complications

## 2014-04-28 NOTE — Interval H&P Note (Signed)
History and Physical Interval Note:  04/28/2014 8:22 AM  [name]  has presented today for surgery, with the diagnosis of right knee DJD  The various methods of treatment have been discussed with the patient and family. After consideration of risks, benefits and other options for treatment, the patient has consented to  Procedure(s): TOTAL KNEE ARTHROPLASTY (Right) as a surgical intervention .  The patient's history has been reviewed, patient examined, no change in status, stable for surgery.  I have reviewed the patient's chart and labs.  Questions were answered to the patient's satisfaction.     Trell Secrist F

## 2014-04-28 NOTE — Transfer of Care (Signed)
Immediate Anesthesia Transfer of Care Note  Patient: [name]  Procedure(s) Performed: Procedure(s): TOTAL KNEE ARTHROPLASTY (Right)  Patient Location: PACU  Anesthesia Type:General  Level of Consciousness: awake, alert  and oriented  Airway & Oxygen Therapy: Patient Spontanous Breathing and Patient connected to nasal cannula oxygen  Post-op Assessment: Report given to RN, Post -op Vital signs reviewed and stable and Patient moving all extremities X 4  Post vital signs: Reviewed and stable  Last Vitals:  Filed Vitals:   04/28/14 1311  BP: 109/41  Pulse: 83  Temp:   Resp: 26    Complications: No apparent anesthesia complications

## 2014-04-28 NOTE — Discharge Summary (Addendum)
Patient ID: [name] MRN: [id number] DOB/AGE: 1925/09/19 81 y.o.  Admit date: 04/28/2014 Discharge date: 04/30/2014  Admission Diagnoses:  Active Problems:   DJD (degenerative joint disease) of knee   Discharge Diagnoses:  Same  Past Medical History  Diagnosis Date  . Hypertension   . Hyperlipidemia   . GERD (gastroesophageal reflux disease) 05/14/2011  . Arthritis     Both knees R>L  . Chronic diastolic congestive heart failure, NYHA class 2   . Severe aortic stenosis 12/06/2013  . Heart murmur   . Shortness of breath dyspnea   . Anxiety   . S/P TAVR (transcatheter aortic valve replacement) 01/19/2014    23 mm Edwards Sapien 3 transcatheter heart valve placed via open right transfemoral approach    Surgeries: Procedure(s): TOTAL KNEE ARTHROPLASTY on 04/28/2014   Consultants:    Discharged Condition: Improved  Hospital Course: DAMYIA BUBB is an 81 y.o. female who was admitted 04/28/2014 for operative treatment of primary localized osteoarthritis right knee. Patient has severe unremitting pain that affects sleep, daily activities, and work/hobbies. After pre-op clearance the patient was taken to the operating room on 04/28/2014 and underwent  Procedure(s): TOTAL KNEE ARTHROPLASTY.  Patient with a pre-op Hb of 11.4 developed ABLA on pod #1 with a Hb of 8.4 and 7.7 on POD#2. She is currently symptomatic and we will transfuse with 2 units prbc this am.  We will continue to follow her.  Patient was given perioperative antibiotics:      Anti-infectives    Start     Dose/Rate Route Frequency Ordered Stop   04/28/14 1800  ceFAZolin (ANCEF) IVPB 1 g/50 mL premix     1 g 100 mL/hr over 30 Minutes Intravenous Every 6 hours 04/28/14 1600 04/29/14 0559   04/28/14 0600  ceFAZolin (ANCEF) IVPB 2 g/50 mL premix     2 g 100 mL/hr over 30 Minutes Intravenous On call to O.R. 04/27/14 1258 04/28/14 1140       Patient was given sequential compression devices, early ambulation, and  chemoprophylaxis to prevent DVT.  Patient benefited maximally from hospital stay and there were no complications.    Recent vital signs:  Patient Vitals for the past 24 hrs:  BP Temp Temp src Pulse Resp SpO2  04/30/14 0532 (!) 138/40 mmHg 98.3 F (36.8 C) Oral 72 17 98 %  04/30/14 0400 - - - - 17 98 %  04/30/14 0000 - - - - 16 94 %  04/29/14 2030 (!) 132/54 mmHg 98.2 F (36.8 C) - 89 16 94 %  04/29/14 2000 - - - - 16 95 %  04/29/14 1600 - - - - 16 94 %  04/29/14 1315 (!) 147/49 mmHg 98.3 F (36.8 C) Oral 86 14 93 %  04/29/14 1200 - - - - 16 95 %     Recent laboratory studies:   Recent Labs  04/29/14 0817 04/30/14 0508  WBC 9.4 9.6  HGB 8.4* 7.7*  HCT 25.6* 23.8*  PLT 237 198  NA 137  --   K 3.7  --   CL 105  --   CO2 28  --   BUN 18  --   CREATININE 1.12*  --   GLUCOSE 138*  --   CALCIUM 8.6  --      Discharge Medications:     Medication List    STOP taking these medications        acetaminophen 500 MG tablet  Commonly known as:  TYLENOL  ALEVE 220 MG tablet  Generic drug:  naproxen sodium      TAKE these medications        amLODipine 10 MG tablet  Commonly known as:  NORVASC  Take 10 mg by mouth daily.     bisacodyl 5 MG EC tablet  Commonly known as:  DULCOLAX  Take 1 tablet (5 mg total) by mouth daily as needed for moderate constipation.     carvedilol 12.5 MG tablet  Commonly known as:  COREG  Take 12.5 mg by mouth 2 (two) times daily.     cholecalciferol 1000 UNITS tablet  Commonly known as:  VITAMIN D  Take 1,000 Units by mouth 2 (two) times daily.     clopidogrel 75 MG tablet  Commonly known as:  PLAVIX  Take 1 tablet (75 mg total) by mouth daily with breakfast.     fenofibrate micronized 200 MG capsule  Commonly known as:  LOFIBRA  Take 200 mg by mouth daily.     fluticasone 50 MCG/ACT nasal spray  Commonly known as:  FLONASE  Place 1 spray into both nostrils 2 (two) times daily as needed (congestion).      hydrochlorothiazide 25 MG tablet  Commonly known as:  HYDRODIURIL  Take 12.5 mg by mouth daily.     morphine 15 MG tablet  Commonly known as:  MSIR  Take 1 tablet (15 mg total) by mouth every 4 (four) hours as needed for severe pain.     omeprazole 40 MG capsule  Commonly known as:  PRILOSEC  Take 1 capsule (40 mg total) by mouth daily.     ondansetron 4 MG tablet  Commonly known as:  ZOFRAN  Take 1 tablet (4 mg total) by mouth every 8 (eight) hours as needed for nausea or vomiting.        Diagnostic Studies: Dg Knee Right Port  04/28/2014   CLINICAL DATA:  Postop total right knee replacement.  EXAM: PORTABLE RIGHT KNEE - 1-2 VIEW  COMPARISON:  MRI 11/09/2005  FINDINGS: Changes of right knee replacement. No hardware or bony complicating feature. Soft tissue drain in place. Soft tissue and joint space gas present.  IMPRESSION: Right knee replacement.  No complicating feature.   Electronically Signed   By: Rolm Baptise M.D.   On: 04/28/2014 14:38    Disposition: 01-Home or Self Care  Discharge Instructions    CPM    Complete by:  As directed   Continuous passive motion machine (CPM):      Use the CPM from 0- to 60 for 6 hours per day.      You may increase by 10 per day.  You may break it up into 2 or 3 sessions per day.      Use CPM for 2-3 weeks or until you are told to stop.     Call MD / Call 911    Complete by:  As directed   If you experience chest pain or shortness of breath, CALL 911 and be transported to the hospital emergency room.  If you develope a fever above 101 F, pus (white drainage) or increased drainage or redness at the wound, or calf pain, call your surgeon's office.     Change dressing    Complete by:  As directed   Change dressing on Saturday, then change the dressing daily with sterile 4 x 4 inch gauze dressing and apply TED hose.  You may clean the incision with alcohol prior to redressing.  Constipation Prevention    Complete by:  As directed   Drink  plenty of fluids.  Prune juice may be helpful.  You may use a stool softener, such as Colace (over the counter) 100 mg twice a day.  Use MiraLax (over the counter) for constipation as needed.     Diet - low sodium heart healthy    Complete by:  As directed      Discharge instructions    Complete by:  As directed   Weight bearing as tolerated.  Resume Plavix and Aspirin.  Change bandage daily starting on Saturday.  May shower on Monday, but do not soak incision.  May apply ice for up to 20 minutes at a time for pain and swelling.  Follow up appointment in two weeks.     Do not put a pillow under the knee. Place it under the heel.    Complete by:  As directed   Place gray foam under operative heel when in bed or in a chair to work on extension     Increase activity slowly as tolerated    Complete by:  As directed      TED hose    Complete by:  As directed   Use stockings (TED hose) for 2 weeks on both leg(s).  You may remove them at night for sleeping.           Follow-up Information    Follow up with [facility] F, MD. Schedule an appointment as soon as possible for a visit in 2 weeks.   Specialty:  Orthopedic Surgery   Contact information:   [address] [place] [address] [phone]        Signed: Ermalene Postin. Mendel Ryder 04/30/2014, 6:45 AM

## 2014-04-28 NOTE — Plan of Care (Signed)
Problem: Consults Goal: Diagnosis- Total Joint Replacement Primary Total Knee     

## 2014-04-28 NOTE — Discharge Instructions (Signed)
Total Knee Replacement, Care After Refer to this sheet in the next few weeks. These instructions provide you with information on caring for yourself after your procedure. Your health care provider also may give you specific instructions. Your treatment has been planned according to the most current medical practices, but problems sometimes occur. Call your health care provider if you have any problems or questions after your procedure. HOME CARE INSTRUCTIONS   Weight bearing as tolerated.  Resume Plavix and Aspirin.  Change bandage daily starting on Saturday.  May shower on Monday, but do not soak incision.  May apply ice for up to 20 minutes at a time for pain and swelling.  Follow up appointment in two weeks.    See a physical therapist as directed by your health care provider.  Take medicines only as directed by your health care provider.  Avoid lifting or driving until you are instructed otherwise.  If you have been sent home with a continuous passive motion machine, use it as directed by your health care provider. SEEK MEDICAL CARE IF:  You have difficulty breathing.  You have drainage, redness, swelling, or pain at your incision site.  You have a bad smell coming from your incision site.  You have persistent bleeding from your incision site.  Your incision breaks open after sutures (stitches) or staples have been removed.  You have a fever. SEEK IMMEDIATE MEDICAL CARE IF:   You have a rash.  You have pain or swelling in your calf or thigh.  You have shortness of breath or chest pain.  Your range of motion in your knee is decreasing rather than increasing. MAKE SURE YOU:   Understand these instructions.  Will watch your condition.  Will get help right away if you are not doing well or get worse. Document Released: 08/25/2004 Document Revised: 06/22/2013 Document Reviewed: 03/27/2011 [facility] Patient Information 2015 [place], [place]. This information is not intended to  replace advice given to you by your health care provider. Make sure you discuss any questions you have with your health care provider.

## 2014-04-28 NOTE — Anesthesia Preprocedure Evaluation (Addendum)
Anesthesia Evaluation  Patient identified by MRN, date of birth, ID band Patient awake    Reviewed: Allergy & Precautions, NPO status , Patient's Chart, lab work & pertinent test results  Airway Mallampati: II  TM Distance: >3 FB Neck ROM: Full    Dental no notable dental hx. (+) Dental Advisory Given, Teeth Intact,    Pulmonary neg pulmonary ROS, shortness of breath,  breath sounds clear to auscultation  Pulmonary exam normal       Cardiovascular hypertension, Pt. on medications and Pt. on home beta blockers +CHF + Valvular Problems/Murmurs AS Rhythm:Regular Rate:Normal + Systolic murmurs S/P TAVR (transcatheter aortic valve replacement) 01/19/2014 23 mm Edwards Sapien 3 transcatheter heart valve placed via open right transfemoral approach  EF 60%, no perivalvular leak around TAVR valve 02/2014     Neuro/Psych PSYCHIATRIC DISORDERS Anxiety negative neurological ROS  negative psych ROS   GI/Hepatic negative GI ROS, Neg liver ROS, GERD-  Medicated,  Endo/Other  negative endocrine ROS  Renal/GU negative Renal ROS  negative genitourinary   Musculoskeletal negative musculoskeletal ROS (+) Arthritis -,   Abdominal   Peds negative pediatric ROS (+)  Hematology  (+) anemia ,   Anesthesia Other Findings   Reproductive/Obstetrics negative OB ROS                         Anesthesia Physical Anesthesia Plan  ASA: III  Anesthesia Plan: General   Post-op Pain Management:    Induction: Intravenous  Airway Management Planned: LMA and Oral ETT  Additional Equipment:   Intra-op Plan:   Post-operative Plan: Extubation in OR  Informed Consent: I have reviewed the patients History and Physical, chart, labs and discussed the procedure including the risks, benefits and alternatives for the proposed anesthesia with the patient or authorized representative who has indicated his/her understanding and  acceptance.   Dental advisory given  Plan Discussed with: CRNA and Surgeon  Anesthesia Plan Comments:         Anesthesia Quick Evaluation

## 2014-04-29 ENCOUNTER — Encounter (HOSPITAL_COMMUNITY): Payer: Self-pay | Admitting: Orthopedic Surgery

## 2014-04-29 LAB — BASIC METABOLIC PANEL
Anion gap: 4 — ABNORMAL LOW (ref 5–15)
BUN: 18 mg/dL (ref 6–23)
CO2: 28 mmol/L (ref 19–32)
Calcium: 8.6 mg/dL (ref 8.4–10.5)
Chloride: 105 mmol/L (ref 96–112)
Creatinine, Ser: 1.12 mg/dL — ABNORMAL HIGH (ref 0.50–1.10)
GFR calc Af Amer: 50 mL/min — ABNORMAL LOW (ref >90)
GFR, EST NON AFRICAN AMERICAN: 43 mL/min — AB (ref >90)
GLUCOSE: 138 mg/dL — AB (ref 70–99)
Potassium: 3.7 mmol/L (ref 3.5–5.1)
Sodium: 137 mmol/L (ref 135–145)

## 2014-04-29 LAB — CBC
HCT: 25.6 % — ABNORMAL LOW (ref 36.0–46.0)
HEMOGLOBIN: 8.4 g/dL — AB (ref 12.0–15.0)
MCH: 29.3 pg (ref 26.0–34.0)
MCHC: 32.8 g/dL (ref 30.0–36.0)
MCV: 89.2 fL (ref 78.0–100.0)
Platelets: 237 10*3/uL (ref 150–400)
RBC: 2.87 MIL/uL — AB (ref 3.87–5.11)
RDW: 14.3 % (ref 11.5–15.5)
WBC: 9.4 10*3/uL (ref 4.0–10.5)

## 2014-04-29 MED ORDER — MORPHINE SULFATE 2 MG/ML IJ SOLN
INTRAMUSCULAR | Status: AC
  Administered 2014-04-29: 1 mg via INTRAVENOUS
  Filled 2014-04-29: qty 1

## 2014-04-29 MED ORDER — MORPHINE SULFATE 2 MG/ML IJ SOLN
1.0000 mg | INTRAMUSCULAR | Status: DC | PRN
  Administered 2014-04-29 (×2): 1 mg via INTRAVENOUS
  Filled 2014-04-29: qty 1

## 2014-04-29 NOTE — Clinical Social Work Psychosocial (Signed)
Clinical Social Work Department BRIEF PSYCHOSOCIAL ASSESSMENT 04/29/2014  Patient:  [name], [name]     Account Number:  [id number]     Admit date:  04/28/2014  Clinical Social Worker:  Wylene Men  Date/Time:  04/29/2014 11:18 AM  Referred by:  Physician  Date Referred:  04/29/2014 Referred for  SNF Placement  Psychosocial assessment   Other Referral:   none   Interview type:  Other - See comment Other interview type:   son, Jenny Reichmann  and patient    PSYCHOSOCIAL DATA Living Status:  ALONE Admitted from facility:   Level of care:   Primary support name:  John Primary support relationship to patient:  CHILD, ADULT Degree of support available:   strong    CURRENT CONCERNS Current Concerns  Post-Acute Placement   Other Concerns:   none    SOCIAL WORK ASSESSMENT / PLAN CSW assessed patient at bedside.  Patient is nauseate at the present time and requests CSW speak with son.  Son, Jenny Reichmann was at bedside appearing engaged and supportive. Patient reports having a daughter who is involved in care plan as well.  John reports that the patient is from home alone and has been pre-registered at [facility] for STR once medically discharged.  Jenny Reichmann is unsure at this time if patient will need hospital transportation or if family will feel comfortable enough to transport patient to [facility].  [facility] has confirmed bed availability and reservation.  Admission's liaison to complete paperwork at bedside.  Patient is hopeful to return to independent living once completed STR.  Patient reports she is "too busy" to be in a SNF too long.  CSW will continue to follow and assist with disposition and discharge planning.   Assessment/plan status:  Psychosocial Support/Ongoing Assessment of Needs Other assessment/ plan:   FL2  PASARR   Information/referral to community resources:   SNF/STR    PATIENT'S/FAMILY'S RESPONSE TO PLAN OF CARE: Patient is agreeable to SNF placement and has  pre-registered at [facility].  Transportation needs unclear at present time.       Nonnie Done, Daniel [phone]  Psychiatric & Orthopedics (5N 1-16) Clinical Social Worker

## 2014-04-29 NOTE — Progress Notes (Signed)
Physical Therapy Treatment Patient Details Name: [name] MRN: [id number] DOB: 13-Aug-1925 Today's Date: 04/29/2014    History of Present Illness 81 y.o. female admitted to [facility] on 04/28/14 s/p elective R TKA.  She has significant PMHx of HTN, CHF, aortic stenosis, SOB, anxiety, s/p transcatheter aortic valve replacement 01/2014), and Bil knee arthroscopy.  Pt self reports HOH.     PT Comments    Pt limited by nausea this session. Tolerated sitting EOB and bed exercises only. Plan to attempt OOB activity this afternoon as tolerated.   Follow Up Recommendations  SNF     Equipment Recommendations  None recommended by PT    Recommendations for Other Services       Precautions / Restrictions Precautions Precautions: Fall;Knee Precaution Comments: reviewed no pillow under knee Required Braces or Orthoses: Knee Immobilizer - Right Knee Immobilizer - Right: Other (comment) Restrictions Weight Bearing Restrictions: Yes RLE Weight Bearing: Weight bearing as tolerated    Mobility  Bed Mobility Overal bed mobility: Needs Assistance Bed Mobility: Supine to Sit;Sit to Supine     Supine to sit: Supervision;HOB elevated Sit to supine: Min assist   General bed mobility comments: cues and relying on handrail to come to sitting position; required (A) to advance Rt LE back into bed   Transfers                 General transfer comment: unable to tolerate due to nausea/dizziness  Ambulation/Gait                 Stairs            Wheelchair Mobility    Modified Rankin (Stroke Patients Only)       Balance Overall balance assessment: Needs assistance Sitting-balance support: Feet supported;No upper extremity supported Sitting balance-Leahy Scale: Fair Sitting balance - Comments: sat EOB ~5 min; c/o nausea and dizziness; required return back to supine                            Cognition Arousal/Alertness: Awake/alert Behavior During Therapy:  WFL for tasks assessed/performed Overall Cognitive Status: Within Functional Limits for tasks assessed                      Exercises Total Joint Exercises Ankle Circles/Pumps: AROM;Both;10 reps Quad Sets: AROM;10 reps;Right Heel Slides: AAROM;Right;10 reps;Supine    General Comments General comments (skin integrity, edema, etc.): pt on O2; discussed pain meds with RN      Pertinent Vitals/Pain Pain Assessment: No/denies pain    Home Living                      Prior Function            PT Goals (current goals can now be found in the care plan section) Acute Rehab PT Goals Patient Stated Goal: to not feel so sick PT Goal Formulation: With patient Time For Goal Achievement: 05/05/14 Potential to Achieve Goals: Good Progress towards PT goals: Not progressing toward goals - comment (limited by nausea )    Frequency  7X/week    PT Plan Current plan remains appropriate    Co-evaluation             End of Session Equipment Utilized During Treatment: Right knee immobilizer;Oxygen Activity Tolerance: Other (comment) (nausea ) Patient left: in bed;with call bell/phone within reach;with family/visitor present     Time: 1013-1029 PT  Time Calculation (min) (ACUTE ONLY): 16 min  Charges:  $Therapeutic Exercise: 8-22 mins                    G Codes:      Gustavus Bryant, [place]  [phone] 04/29/2014, 11:43 AM

## 2014-04-29 NOTE — Progress Notes (Signed)
04/29/14 PT/OT recommending SNF, referral made to CSW. Plan is for patient to d/c to [facility]. Will continue to follow until d/c.

## 2014-04-29 NOTE — Progress Notes (Signed)
Subjective: 1 Day Post-Op Procedure(s) (LRB): TOTAL KNEE ARTHROPLASTY (Right) Patient reports pain as moderate.  Patient experienced moderate nausea without vomiting yesterday evening.  This has subsided as of this am and patient is feeling much better.  No lightheadedness/dizziness.  Negative flatus/bm.  Tolerating diet.  Objective: Vital signs in last 24 hours: Temp:  [97.5 F (36.4 C)-98.6 F (37 C)] 97.5 F (36.4 C) (03/10 0456) Pulse Rate:  [68-87] 74 (03/10 0456) Resp:  [12-26] 16 (03/10 0456) BP: (108-176)/(37-59) 128/43 mmHg (03/10 0456) SpO2:  [90 %-98 %] 93 % (03/10 0456) Weight:  [67.132 kg (148 lb)] 67.132 kg (148 lb) (03/09 0931)  Intake/Output from previous day: 03/09 0701 - 03/10 0700 In: 1200 [I.V.:1200] Out: 250 [Drains:200; Blood:50] Intake/Output this shift: Total I/O In: -  Out: 100 [Drains:100]  No results for input(s): HGB in the last 72 hours. No results for input(s): WBC, RBC, HCT, PLT in the last 72 hours. No results for input(s): NA, K, CL, CO2, BUN, CREATININE, GLUCOSE, CALCIUM in the last 72 hours. No results for input(s): LABPT, INR in the last 72 hours.  Neurologically intact Neurovascular intact Sensation intact distally Intact pulses distally Dorsiflexion/Plantar flexion intact Compartment soft  Negative homans bilaterally hemovac drain pulled by me today  Assessment/Plan: 1 Day Post-Op Procedure(s) (LRB): TOTAL KNEE ARTHROPLASTY (Right) Advance diet Up with therapy Discharge to SNF on Saturday WBAT RLE Dry dressing change prn  Venida Jarvis, M. Mendel Ryder 04/29/2014, 6:13 AM

## 2014-04-29 NOTE — Clinical Social Work Note (Signed)
CSW met with patient and family regarding disposition.  Patient has pre-registered with [facility].  Transportation needs uncertain at this time.  Admission's coordinator for [facility] to complete admission's paperwork at bedside.  Family aware.  Disposition: [place] [facility] - transportation needs- unclear at this time  Nonnie Done, Latanya Presser [phone]  Psychiatric & Orthopedics (5N 1-16) Clinical Social Worker

## 2014-04-29 NOTE — Evaluation (Signed)
Occupational Therapy Evaluation Patient Details Name: [name] MRN: [id number] DOB: May 04, 1925 Today's Date: 04/29/2014    History of Present Illness 81 y.o. female admitted to [facility] on 04/28/14 s/p elective R TKA.  She has significant PMHx of HTN, CHF, aortic stenosis, SOB, anxiety, s/p transcatheter aortic valve replacement 01/2014), and Bil knee arthroscopy.  Pt self reports HOH.    Clinical Impression   Patient independent and living alone PTA. Patient currently requires up to mod assist for functional mobility and ADLs. Patient will benefit from acute OT to increase overall independence in the areas of ADLs, functional mobility, and overall safety in order to safely discharge to venue listed below.     Follow Up Recommendations  SNF;Supervision/Assistance - 24 hour    Equipment Recommendations   (TBD)    Recommendations for Other Services  None at this time   Precautions / Restrictions Precautions Precautions: Fall;Knee Precaution Comments: reviewed no pillow under knee Required Braces or Orthoses: Knee Immobilizer - Right Knee Immobilizer - Right: Other (comment) Restrictions Weight Bearing Restrictions: Yes RLE Weight Bearing: Weight bearing as tolerated      Mobility Bed Mobility Overal bed mobility: Needs Assistance Bed Mobility: Supine to Sit     Supine to sit: Supervision;HOB elevated Sit to supine: Min assist   General bed mobility comments: Using bed rails   Transfers Overall transfer level: Needs assistance   Transfers: Sit to/from [employer] Transfers Sit to Stand: Min assist Stand pivot transfers: Mod assist       General transfer comment: Increased anxiety during stand pivot transfer.     Balance Overall balance assessment: Needs assistance Sitting-balance support: Feet supported;No upper extremity supported Sitting balance-Leahy Scale: Fair Sitting balance - Comments: sat EOB ~5 min; c/o nausea and dizziness; required return back to  supine   Standing balance support: Bilateral upper extremity supported;During functional activity Standing balance-Leahy Scale: Poor     ADL Overall ADL's : Needs assistance/impaired Eating/Feeding: Set up;Sitting   Grooming: Set up;Sitting   Upper Body Bathing: Min guard;Sitting   Lower Body Bathing: Moderate assistance;Sit to/from stand;Cueing for safety   Upper Body Dressing : Set up;Sitting   Lower Body Dressing: Moderate assistance;Sit to/from stand;Cueing for safety   Toilet Transfer: Moderate assistance;RW;BSC             General ADL Comments: Patient limited by nausea and decreased oxygen support. Patient on 2 liters upon entering room and 02=94%. Therapist doffed 02 and sats increased with activity, but decreased at rest. Donned 02 at end of session; Rn aware of this. Patient unable to reach RLE for LB ADLs. Patient mod assist for functional transfers at this time. Patient has lots of family support, but lives alone and will benefit from SNF from additional rehab piror to discharging home.     Pertinent Vitals/Pain Pain Assessment: Faces Faces Pain Scale: Hurts little more Pain Location: RLE Pain Descriptors / Indicators: Aching;Grimacing Pain Intervention(s): Monitored during session;Repositioned     Hand Dominance Right   Extremity/Trunk Assessment Upper Extremity Assessment Upper Extremity Assessment: Overall WFL for tasks assessed   Lower Extremity Assessment Lower Extremity Assessment: Defer to PT evaluation   Cervical / Trunk Assessment Cervical / Trunk Assessment: Normal   Communication Communication Communication: HOH   Cognition Arousal/Alertness: Awake/alert Behavior During Therapy: WFL for tasks assessed/performed Overall Cognitive Status: Within Functional Limits for tasks assessed              Home Living Family/patient expects to be discharged to:: Skilled  nursing facility [facility]) Living Arrangements: Alone    Prior  Functioning/Environment Level of Independence: Independent      OT Diagnosis: Generalized weakness;Acute pain   OT Problem List: Decreased strength;Decreased range of motion;Decreased activity tolerance;Impaired balance (sitting and/or standing);Decreased safety awareness;Decreased knowledge of use of DME or AE;Decreased knowledge of precautions;Pain   OT Treatment/Interventions: Self-care/ADL training;Therapeutic exercise;Energy conservation;DME and/or AE instruction;Therapeutic activities;Patient/family education;Balance training    OT Goals(Current goals can be found in the care plan section) Acute Rehab OT Goals Patient Stated Goal: go to rehab OT Goal Formulation: With patient/family Time For Goal Achievement: 05/06/14 Potential to Achieve Goals: Good ADL Goals Pt Will Perform Grooming: with min assist;standing Pt Will Perform Upper Body Bathing: Independently;sitting Pt Will Perform Lower Body Bathing: with min assist;sit to/from stand;with adaptive equipment Pt Will Perform Upper Body Dressing: Independently;sitting Pt Will Perform Lower Body Dressing: with min assist;with adaptive equipment;sit to/from stand Pt Will Transfer to Toilet: with min assist;ambulating;bedside commode  OT Frequency: Min 2X/week   Barriers to D/C: Decreased caregiver support          End of Session Equipment Utilized During Treatment: Rolling walker CPM Right Knee CPM Right Knee: Off  Activity Tolerance: Patient tolerated treatment well Patient left: in chair;with call bell/phone within reach;with nursing/sitter in room;with family/visitor present   Time: [id number] OT Time Calculation (min): 18 min Charges:  OT General Charges $OT Visit: 1 Procedure OT Evaluation $Initial OT Evaluation Tier I: 1 Procedure  [name] , MS, OTR/L, CLT Pager: [phone]  04/29/2014, 1:55 PM

## 2014-04-29 NOTE — Progress Notes (Signed)
Physical Therapy Treatment Patient Details Name: [name] MRN: [id number] DOB: Nov 12, 1925 Today's Date: 04/29/2014    History of Present Illness 81 y.o. female admitted to [facility] on 04/28/14 s/p elective R TKA.  She has significant PMHx of HTN, CHF, aortic stenosis, SOB, anxiety, s/p transcatheter aortic valve replacement 01/2014), and Bil knee arthroscopy.  Pt self reports HOH.     PT Comments    Pt cont to be limited by nausea and dizziness. BP 144/44, RN made aware. Will cont to follow per POC. Pt was able to tolerate sitting in chair for ~1 hour and (A) back to bed with PT this session. Hopeful to increase ambulation tomorrow.   Follow Up Recommendations  SNF     Equipment Recommendations  None recommended by PT    Recommendations for Other Services       Precautions / Restrictions Precautions Precautions: Fall;Knee Precaution Comments: reviewed no pillow under knee Required Braces or Orthoses: Knee Immobilizer - Right Knee Immobilizer - Right: Other (comment) Restrictions Weight Bearing Restrictions: Yes RLE Weight Bearing: Weight bearing as tolerated    Mobility  Bed Mobility Overal bed mobility: Needs Assistance Bed Mobility: Sit to Supine     Supine to sit: Supervision;HOB elevated Sit to supine: Min assist   General bed mobility comments: (A) to bring Les into supine; incr time due to nausea   Transfers Overall transfer level: Needs assistance Equipment used: Rolling walker (2 wheeled) Transfers: Sit to/from [employer] Sit to Stand: Min assist Stand pivot transfers: Mod assist       General transfer comment: pt with eyes closed; cues to open eyes with transfers; pt able to power up minimally with stand; max cueing for hand placement and safety with pivotal steps  Ambulation/Gait             General Gait Details: pivotal steps only   Stairs            Wheelchair Mobility    Modified Rankin (Stroke Patients Only)        Balance Overall balance assessment: Needs assistance Sitting-balance support: Feet supported;No upper extremity supported Sitting balance-Leahy Scale: Fair Sitting balance - Comments: c/o nausea amd dizziness   Standing balance support: During functional activity;Bilateral upper extremity supported Standing balance-Leahy Scale: Poor Standing balance comment: RW and min/mod (A) to balance                     Cognition Arousal/Alertness: Lethargic;Suspect due to medications Behavior During Therapy: Flat affect Overall Cognitive Status: Impaired/Different from baseline Area of Impairment: Awareness;Problem solving           Awareness: Emergent Problem Solving: Slow processing;Difficulty sequencing;Requires verbal cues;Requires tactile cues;Decreased initiation General Comments: pt lethargic; arousable with multimodal cueing    Exercises Total Joint Exercises Ankle Circles/Pumps: AROM;Both;10 reps    General Comments General comments (skin integrity, edema, etc.): repositioned in bed with SCDs      Pertinent Vitals/Pain Pain Assessment: Faces Faces Pain Scale: Hurts little more Pain Location: with movement of Rt LE  Pain Descriptors / Indicators: Grimacing Pain Intervention(s): Repositioned;Premedicated before session;Monitored during session    Home Living Family/patient expects to be discharged to:: Skilled nursing facility ([place]) Living Arrangements: Alone                  Prior Function Level of Independence: Independent          PT Goals (current goals can now be found in the care plan section)  Acute Rehab PT Goals Patient Stated Goal: "to get back in bed"  PT Goal Formulation: With patient Time For Goal Achievement: 05/05/14 Potential to Achieve Goals: Good Progress towards PT goals: Not progressing toward goals - comment (limited by nausea)    Frequency  7X/week    PT Plan Current plan remains appropriate     Co-evaluation             End of Session Equipment Utilized During Treatment: Gait belt;Right knee immobilizer;Oxygen Activity Tolerance: Other (comment) (nausea and dizziness) Patient left: in bed;with call bell/phone within reach;with family/visitor present;with SCD's reapplied     Time: [id number] PT Time Calculation (min) (ACUTE ONLY): 14 min  Charges:  $Therapeutic Activity: 8-22 mins                    G CodesGustavus Bryant, [place]  [phone] 04/29/2014, 4:08 PM

## 2014-04-29 NOTE — Op Note (Signed)
[name], [name] NO.:  [id number]  MEDICAL RECORD NO.:  [id number]  LOCATION:  5N23C                        FACILITY:  [facility]  PHYSICIAN:  Ninetta Lights, M.D. DATE OF BIRTH:  [birth year]  DATE OF PROCEDURE:  04/28/2014 DATE OF DISCHARGE:                              OPERATIVE REPORT   PREOPERATIVE DIAGNOSIS:  Right knee end-stage degenerative arthritis, primary localized.  Marked erosive changes in patellofemoral joint.  POSTOPERATIVE DIAGNOSIS:  Right knee end-stage degenerative arthritis, primary localized.  Marked erosive changes in patellofemoral joint with generalized osteoporosis.  PROCEDURE:  Right knee modified minimally invasive total knee replacement, Stryker triathlon prosthesis.  Soft tissue balancing. Cemented pegged #3 cruciate retaining femoral component.  Cemented #4 tibial component, 9-mm CS insert.  Cemented resurfacing 35-mm patellar component.  SURGEON:  Ninetta Lights, M.D.  ASSISTANT:  Eula Listen, PA present throughout the entire case and necessary for timely completion of procedure.  ANESTHESIA:  General.  BLOOD LOSS:  Minimal.  SPECIMENS:  None.  CULTURES:  None.  COMPLICATIONS:  None.  DRESSINGS:  Soft compressive knee immobilizer.  DRAINS:  Hemovac x1.  TOURNIQUET TIME:  45 minutes.  DESCRIPTION OF PROCEDURE:  The patient was brought to the operating room, placed on the operating table in supine position.  After adequate anesthesia had been obtained, tourniquet applied.  Prepped and draped in usual sterile fashion.  Exsanguinated with elevation of Esmarch. Tourniquet inflated to 300 mmHg.  Straight incision above the patella down to tibial tubercle.  Skin and subcutaneous tissue divided.  Medial arthrotomy, vastus splitting.  Distal femur exposed.  An 8 mm resection and 5 degrees of valgus, flexible intramedullary guide.  Using epicondylar axis, the femur was sized, cut, and fitted for a pegged #3 cruciate  retaining component.  Retractors were placed.  Extramedullary guide.  A 3-degree posterior slope cut.  Resection proximal tibia.  Size #4 component.  Rotation was set with trials.  Debris cleared throughout. Patella exposed.  Almost concave.  Brought to a relatively flat good surface.  Drilled, sized, and fitted for a 35 mm component.  With trials in place, I was very pleased of biomechanical axis.  Flexion extension balancing instability.  After the tibia had been hand reamed, wound was thoroughly irrigated with a pulse lavage.  Cement prepared, placed on all components.  Firmly seated.  Polyethylene attached to tibia, knee reduced.  Patella held with a clamp.  Once cement hardened, the knee was irrigated again.  Soft tissues injected with Exparel.  Hemovac placed. Arthrotomy closed with Ethibond with then a subcutaneous subcuticular closure.  Margins were injected with Marcaine.  Sterile compressive dressing applied.  Knee immobilizer applied.  Tourniquet deflated removed.  Anesthesia reversed.  Brought to the recovery room.  Tolerated the surgery well.  No complications.     Ninetta Lights, M.D.     DFM/MEDQ  D:  04/28/2014  T:  04/29/2014  Job:  [id number]

## 2014-04-29 NOTE — Clinical Social Work Placement (Addendum)
Clinical Social Work Department CLINICAL SOCIAL WORK PLACEMENT NOTE 04/29/2014  Patient:  [name], [name]  Account Number:  [id number] [place] date:  04/28/2014  Clinical Social Worker:  Wylene Men  Date/time:  04/29/2014 11:24 AM  Clinical Social Work is seeking post-discharge placement for this patient at the following level of care:   SKILLED NURSING   (*CSW will update this form in Epic as items are completed)   04/29/2014  Patient/family provided with [place] Department of Clinical Social Work's list of facilities offering this level of care within the geographic area requested by the patient (or if unable, by the patient's family).  04/29/2014  Patient/family informed of their freedom to choose among providers that offer the needed level of care, that participate in Medicare, Medicaid or managed care program needed by the patient, have an available bed and are willing to accept the patient.  04/29/2014  Patient/family informed of [facility]' ownership interest in [facility], as well as of the fact that they are under no obligation to receive care at this facility.  PASARR submitted to EDS on 04/29/2014 PASARR number received on 04/29/2014  FL2 transmitted to all facilities in geographic area requested by pt/family on  04/29/2014 FL2 transmitted to all facilities within larger geographic area on   Patient informed that his/her managed care company has contracts with or will negotiate with  certain facilities, including the following:     Patient/family informed of bed offers received:  04/29/2014 Patient chooses bed at [facility] Physician recommends and patient chooses bed at    Patient to be transferred to [place] on 05/01/2014-Aislin Onofre Patrick[place], [place], DP Patient to be transferred to facility by daughter-[name] Patient and family notified of transfer on 05/01/2014 Name of family member notified: daughter-[name]  The following physician  request were entered in Epic:   Additional Comments:  Nonnie Done, [place] [phone]  Psychiatric & Orthopedics (5N 1-16) Clinical Social Worker

## 2014-04-30 LAB — BASIC METABOLIC PANEL
Anion gap: 8 (ref 5–15)
BUN: 18 mg/dL (ref 6–23)
CALCIUM: 8.9 mg/dL (ref 8.4–10.5)
CO2: 25 mmol/L (ref 19–32)
Chloride: 103 mmol/L (ref 96–112)
Creatinine, Ser: 1.11 mg/dL — ABNORMAL HIGH (ref 0.50–1.10)
GFR calc Af Amer: 50 mL/min — ABNORMAL LOW (ref >90)
GFR calc non Af Amer: 43 mL/min — ABNORMAL LOW (ref >90)
GLUCOSE: 130 mg/dL — AB (ref 70–99)
POTASSIUM: 3.9 mmol/L (ref 3.5–5.1)
Sodium: 136 mmol/L (ref 135–145)

## 2014-04-30 LAB — CBC
HCT: 23.8 % — ABNORMAL LOW (ref 36.0–46.0)
HCT: 30.7 % — ABNORMAL LOW (ref 36.0–46.0)
Hemoglobin: 7.7 g/dL — ABNORMAL LOW (ref 12.0–15.0)
Hemoglobin: 10.6 g/dL — ABNORMAL LOW (ref 12.0–15.0)
MCH: 28.2 pg (ref 26.0–34.0)
MCH: 29.8 pg (ref 26.0–34.0)
MCHC: 32.4 g/dL (ref 30.0–36.0)
MCHC: 34.5 g/dL (ref 30.0–36.0)
MCV: 87.2 fL (ref 78.0–100.0)
MCV: 86.2 fL (ref 78.0–100.0)
Platelets: 198 10*3/uL (ref 150–400)
Platelets: 163 10*3/uL (ref 150–400)
RBC: 2.73 MIL/uL — ABNORMAL LOW (ref 3.87–5.11)
RBC: 3.56 MIL/uL — AB (ref 3.87–5.11)
RDW: 14.2 % (ref 11.5–15.5)
RDW: 14.1 % (ref 11.5–15.5)
WBC: 9.6 10*3/uL (ref 4.0–10.5)
WBC: 9.6 10*3/uL (ref 4.0–10.5)

## 2014-04-30 LAB — PREPARE RBC (CROSSMATCH)

## 2014-04-30 MED ORDER — FUROSEMIDE 10 MG/ML IJ SOLN: 20.0000 mg | Freq: Once | INTRAMUSCULAR | Status: DC

## 2014-04-30 MED ORDER — SODIUM CHLORIDE 0.9 % IV SOLN: Freq: Once | INTRAVENOUS | Status: DC

## 2014-04-30 NOTE — Progress Notes (Signed)
Subjective: 2 Days Post-Op Procedure(s) (LRB): TOTAL KNEE ARTHROPLASTY (Right) Patient reports pain as mild.  Patient experienced increased nausea/vomiting for the majority of the day yesterday.  This seemed to have subsided late yesterday afternoon.  She feels much better this am.  Khala admits to being fairly lightheaded/dizzy since yesterday afternoon and her Hb has markedly decreased over the past 1-2 days.  Positive flatus/no bm.  She started tolerating some solid food yesterday evening and feels like she has a good appetite this am.    Objective: Vital signs in last 24 hours: Temp:  [98.2 F (36.8 C)-98.3 F (36.8 C)] 98.3 F (36.8 C) (03/11 0532) Pulse Rate:  [72-89] 72 (03/11 0532) Resp:  [14-17] 17 (03/11 0532) BP: (132-147)/(40-54) 138/40 mmHg (03/11 0532) SpO2:  [93 %-98 %] 98 % (03/11 0532)  Intake/Output from previous day:   Intake/Output this shift:     Recent Labs  04/29/14 0817 04/30/14 0508  HGB 8.4* 7.7*    Recent Labs  04/29/14 0817 04/30/14 0508  WBC 9.4 9.6  RBC 2.87* 2.73*  HCT 25.6* 23.8*  PLT 237 198    Recent Labs  04/29/14 0817  NA 137  K 3.7  CL 105  CO2 28  BUN 18  CREATININE 1.12*  GLUCOSE 138*  CALCIUM 8.6   No results for input(s): LABPT, INR in the last 72 hours.  Neurologically intact Neurovascular intact Sensation intact distally Intact pulses distally Dorsiflexion/Plantar flexion intact Incision: scant drainage No cellulitis present Compartment soft  Negative homans bilaterally Dressing change by me today  Assessment/Plan: 2 Days Post-Op Procedure(s) (LRB): TOTAL KNEE ARTHROPLASTY (Right) Advance diet Up with therapy D/C IV fluids Discharge to SNF tomorrow WBAT RLE ABLA- will transfuse with 2 units PRBC today Dry dressing change prn  Venida Jarvis, M. Mendel Ryder 04/30/2014, 6:38 AM

## 2014-04-30 NOTE — Care Management Note (Signed)
CARE MANAGEMENT NOTE 04/30/2014  Patient:  [name], [name]   Account Number:  [id number]  Date Initiated:  04/29/2014  Documentation initiated by:  [facility]  Subjective/Objective Assessment:   s/p rt TKA     Action/Plan:   PT/OT evals- recommended HHPT   Anticipated DC Date:  04/01/2014   Anticipated DC Plan:  SKILLED NURSING FACILITY  In-house referral  Clinical Social Worker      DC Planning Services  CM consult      Choice offered to / List presented to:             Status of service:  Completed, signed off Medicare Important Message given?   (If response is "NO", the following Medicare IM given date fields will be blank) Date Medicare IM given:   Medicare IM given by:   Date Additional Medicare IM given:  04/30/2014 Additional Medicare IM given by:  Norina Buzzard  Discharge Disposition:  [place]  Per UR Regulation:  Reviewed for med. necessity/level of care/duration of stay  If discussed at [place] of Stay Meetings, dates discussed:    Comments:  04/29/14 PT/OT recommending SNF, referral made to CSW. Plan is for patient to d/c to [facility]. Will continue to follow until d/c.

## 2014-04-30 NOTE — Progress Notes (Signed)
Orthopedic Tech Progress Note Patient Details:  [name] [birth year] [id number]  Patient ID: [name], female   DOB: [birth year], 81 y.o.   MRN: [id number] Placed pt's rle in cpm @0 -40 degrees @1420 ; will increase as pt tolerates; rn notified Pharrah Rottman 04/30/2014, 2:15 PM

## 2014-04-30 NOTE — Progress Notes (Signed)
Physical Therapy Treatment Patient Details Name: [name] MRN: [id number] DOB: 12-11-1925 Today's Date: 04/30/2014    History of Present Illness 81 y.o. female admitted to [facility] on 04/28/14 s/p elective R TKA.  She has significant PMHx of HTN, CHF, aortic stenosis, SOB, anxiety, s/p transcatheter aortic valve replacement 01/2014), and Bil knee arthroscopy.  Pt self reports HOH.     PT Comments    Pt's ambulatory activity was limited 2/2 active blood transfusion during session.  Pt performed pivotal steps only from bed to [facility] and BSC to chair.  Pt denied any sx of dizziness or lightheadedness during session today.  Pt tolerated therapeutic exercises and was very willing to participate.  Pt is anticipating d/c to [facility].     Follow Up Recommendations  SNF     Equipment Recommendations  None recommended by PT    Recommendations for Other Services       Precautions / Restrictions Precautions Precautions: Fall;Knee Restrictions Weight Bearing Restrictions: Yes RLE Weight Bearing: Weight bearing as tolerated    Mobility  Bed Mobility Overal bed mobility: Modified Independent Bed Mobility: Supine to Sit     Supine to sit: Modified independent (Device/Increase time);HOB elevated     General bed mobility comments: increased time w/ pushing up trunk  Transfers Overall transfer level: Needs assistance Equipment used: Rolling walker (2 wheeled) Transfers: Sit to/from Stand Sit to Stand: Min assist Stand pivot transfers: Min assist       General transfer comment: Pt required min assist w/ standing upright   Ambulation/Gait             General Gait Details: pivotal steps only.  Ambulation deferred this session 2/2 pt receiving blood transfusion   Stairs            Wheelchair Mobility    Modified Rankin (Stroke Patients Only)       Balance Overall balance assessment: Needs assistance Sitting-balance support: Bilateral upper extremity  supported;Feet supported Sitting balance-Leahy Scale: Fair Sitting balance - Comments: slight dizziness that dissipated after 30 seconds   Standing balance support: Bilateral upper extremity supported Standing balance-Leahy Scale: Poor                      Cognition Arousal/Alertness: Awake/alert Behavior During Therapy: WFL for tasks assessed/performed Overall Cognitive Status: Within Functional Limits for tasks assessed                      Exercises Total Joint Exercises Ankle Circles/Pumps: AROM;Both;10 reps Quad Sets: AROM;Right;10 reps;Supine Heel Slides: AROM;Right;10 reps;Supine Straight Leg Raises: AROM;Right;5 reps;Supine Knee Flexion: AROM;Right;5 reps;Seated Goniometric ROM: 13-78    General Comments General comments (skin integrity, edema, etc.): session was limited by pt receiving blood transfusion; pt was extremely willing to participate today      Pertinent Vitals/Pain Pain Assessment: 0-10 Pain Score: 5  Pain Location: R knee Pain Descriptors / Indicators: Aching;Discomfort Pain Intervention(s): Limited activity within patient's tolerance;Monitored during session;Repositioned    Home Living                      Prior Function            PT Goals (current goals can now be found in the care plan section) Acute Rehab PT Goals Patient Stated Goal: to get to the chair PT Goal Formulation: With patient Time For Goal Achievement: 05/05/14 Potential to Achieve Goals: Good Progress towards PT goals: Progressing toward  goals    Frequency  7X/week    PT Plan Current plan remains appropriate    Co-evaluation             End of Session Equipment Utilized During Treatment: Gait belt;Oxygen Activity Tolerance: Treatment limited secondary to medical complications (Comment) (active blood transfusion) Patient left: in chair;with call bell/phone within reach;with family/visitor present     Time: [id number] PT Time Calculation  (min) (ACUTE ONLY): 34 min  Charges:  $Therapeutic Exercise: 8-22 mins $Therapeutic Activity: 8-22 mins                    G CodesJoslyn Hy PT, [place] [id number] 04/30/2014, 5:14 PM

## 2014-05-01 LAB — BASIC METABOLIC PANEL
Anion gap: 6 (ref 5–15)
BUN: 14 mg/dL (ref 6–23)
CO2: 29 mmol/L (ref 19–32)
Calcium: 9.1 mg/dL (ref 8.4–10.5)
Chloride: 101 mmol/L (ref 96–112)
Creatinine, Ser: 0.9 mg/dL (ref 0.50–1.10)
GFR calc Af Amer: 65 mL/min — ABNORMAL LOW (ref >90)
GFR, EST NON AFRICAN AMERICAN: 56 mL/min — AB (ref >90)
GLUCOSE: 109 mg/dL — AB (ref 70–99)
POTASSIUM: 3.3 mmol/L — AB (ref 3.5–5.1)
Sodium: 136 mmol/L (ref 135–145)

## 2014-05-01 LAB — TYPE AND SCREEN
ABO/RH(D): O POS
Antibody Screen: NEGATIVE
Unit division: 0
Unit division: 0

## 2014-05-01 LAB — CBC
HEMATOCRIT: 32.2 % — AB (ref 36.0–46.0)
HEMOGLOBIN: 10.8 g/dL — AB (ref 12.0–15.0)
MCH: 29.3 pg (ref 26.0–34.0)
MCHC: 33.5 g/dL (ref 30.0–36.0)
MCV: 87.3 fL (ref 78.0–100.0)
PLATELETS: 173 10*3/uL (ref 150–400)
RBC: 3.69 MIL/uL — ABNORMAL LOW (ref 3.87–5.11)
RDW: 14.2 % (ref 11.5–15.5)
WBC: 9.1 10*3/uL (ref 4.0–10.5)

## 2014-05-01 NOTE — Progress Notes (Signed)
States she had a BM day of surgery at home [04/28/14] and she felt like she could have one yesterday - states that getting that blood, I just 'forgot.' Requested prune juice, states always makes her go. Drank 4 oz prune juice.

## 2014-05-01 NOTE — Progress Notes (Signed)
All night patient has been resting in bed with occasional waking up for assessment or vitals, or BSC.  This morning around 4 am she started c/o not getting her Tylenol PM for sleep.  There is no order for Tylenol PM and at 0400 this am is the first patient mentioned to evening RN.  Offered Robaxin 500 mg PO at 0426.  MD, please put an order for Tylenol PM if appropriate. Thank you.

## 2014-05-01 NOTE — Progress Notes (Signed)
Orthopaedic Trauma Service Progress Note  Subjective  Doing well Working with physical therapy Eager to get the nursing home today Tolerated transfusion yesterday No other complaints noted  Review of Systems  Constitutional: Negative for fever and chills.  Respiratory: Negative for shortness of breath and wheezing.   Cardiovascular: Negative for chest pain and palpitations.  Gastrointestinal: Negative for nausea and vomiting.  Neurological: Negative for tingling, sensory change and headaches.     Objective   BP 155/59 mmHg  Pulse 83  Temp(Src) 98.3 F (36.8 C) (Oral)  Resp 18  Ht 5\' 4"  (1.626 m)  Wt 67.132 kg (148 lb)  BMI 25.39 kg/m2  SpO2 94%  Intake/Output      03/11 0701 - 03/12 0700 03/12 0701 - 03/13 0700   P.O. 240 120   I.V. (mL/kg)     Blood 771    Total Intake(mL/kg) 1011 (15.1) 120 (1.8)   Urine (mL/kg/hr)  300 (1.6)   Total Output   300   Net +1011 -180        Urine Occurrence 3 x 1 x     Labs  Results for [name], [name] (MRN [id number]) as of 05/01/2014 09:53  Ref. Range 05/01/2014 06:25  Sodium Latest Range: 135-145 mmol/L 136  Potassium Latest Range: 3.5-5.1 mmol/L 3.3 (L)  Chloride Latest Range: 96-112 mmol/L 101  CO2 Latest Range: 19-32 mmol/L 29  BUN Latest Range: 6-23 mg/dL 14  Creatinine Latest Range: 0.50-1.10 mg/dL 0.90  Calcium Latest Range: 8.4-10.5 mg/dL 9.1  GFR calc non Af Amer Latest Range: >90 mL/min 56 (L)  GFR calc Af Amer Latest Range: >90 mL/min 65 (L)  Glucose Latest Range: 70-99 mg/dL 109 (H)  Anion gap Latest Range: 5-15  6  WBC Latest Range: 4.0-10.5 K/uL 9.1  RBC Latest Range: 3.87-5.11 MIL/uL 3.69 (L)  Hemoglobin Latest Range: 12.0-15.0 g/dL 10.8 (L)  HCT Latest Range: 36.0-46.0 % 32.2 (L)  MCV Latest Range: 78.0-100.0 fL 87.3  MCH Latest Range: 26.0-34.0 pg 29.3  MCHC Latest Range: 30.0-36.0 g/dL 33.5  RDW Latest Range: 11.5-15.5 % 14.2  Platelets Latest Range: 150-400 K/uL 173    Exam  Gen: Resting  comfortably in bed, no acute distress Lungs: Clear bilaterally, anterior fields Cardiac: S1 and S2 regular Abd:+ Bowel sounds, nontender nondistended Ext:       Right lower extremity  Dressing clean dry and intact  Motor and sensory functions intact  Extremity is warm  + DP pulse  Swelling is stable   Assessment and Plan   POD/HD#: 38   81 year old female with right knee DJD status post right total knee arthroplasty  1. Right knee DJD, right TKA  Weight-bear as tolerated  Range of motion as tolerated  Dressing changes as needed  Ice as needed  Therapies  2. Medical issues  Continue with home meds  3. Acute blood loss anemia  Improved and stable following 2 units of packed red cells  4. DVT and PE prophylaxis  On Plavix chronically  5. Disposition  Skilled nursing facility this afternoon  Follow-up with Dr. Percell Miller in 2 weeks  Jari Pigg, PA-C [facility] [phone] (O) 05/01/2014 9:52 AM

## 2014-05-01 NOTE — Clinical Social Work Note (Signed)
CSW made aware patient ready for d/c to [facility]. CSW contacted facility and spoke with [facility], Theodoro Grist who confirmed bed availability. CSW met with patient who is agreeable to d/c plan. Patient states her daughter [name] will transport her to facility. CSW faxed d/c summary to facility. CSW prepared d/c packet and placed in patient's shadow chart. CSW provided RN Audrea Muscat with number for report. CSW provided patient's daughter with d/c packet for transportation to facility. No further needs. CSW signing off.   [place], [place] Weekend Clinical Social Worker [phone]

## 2014-05-01 NOTE — Progress Notes (Signed)
Patient left with daughter via w/c with Nsg. Tech assist to exit. Given Tylenol 650 mg PO for pain management prior to d/c. Saline lock to Rt hand removed w/o complications. Transported via auto, daughter driving. Report called Theodoro Grist, [place] [facility].

## 2014-05-03 ENCOUNTER — Non-Acute Institutional Stay (SKILLED_NURSING_FACILITY): Payer: Medicare Other | Admitting: Adult Health

## 2014-05-03 DIAGNOSIS — I5032 Chronic diastolic (congestive) heart failure: Secondary | ICD-10-CM

## 2014-05-03 DIAGNOSIS — D62 Acute posthemorrhagic anemia: Secondary | ICD-10-CM

## 2014-05-03 DIAGNOSIS — K219 Gastro-esophageal reflux disease without esophagitis: Secondary | ICD-10-CM

## 2014-05-03 DIAGNOSIS — M1711 Unilateral primary osteoarthritis, right knee: Secondary | ICD-10-CM

## 2014-05-03 DIAGNOSIS — I10 Essential (primary) hypertension: Secondary | ICD-10-CM | POA: Diagnosis not present

## 2014-05-03 DIAGNOSIS — Z954 Presence of other heart-valve replacement: Secondary | ICD-10-CM | POA: Diagnosis not present

## 2014-05-03 DIAGNOSIS — Z952 Presence of prosthetic heart valve: Secondary | ICD-10-CM

## 2014-05-03 DIAGNOSIS — E785 Hyperlipidemia, unspecified: Secondary | ICD-10-CM | POA: Diagnosis not present

## 2014-05-05 ENCOUNTER — Non-Acute Institutional Stay (SKILLED_NURSING_FACILITY): Payer: Medicare Other | Admitting: Internal Medicine

## 2014-05-05 DIAGNOSIS — R110 Nausea: Secondary | ICD-10-CM | POA: Diagnosis not present

## 2014-05-05 DIAGNOSIS — Z954 Presence of other heart-valve replacement: Secondary | ICD-10-CM | POA: Diagnosis not present

## 2014-05-05 DIAGNOSIS — Z952 Presence of prosthetic heart valve: Secondary | ICD-10-CM

## 2014-05-05 DIAGNOSIS — K5901 Slow transit constipation: Secondary | ICD-10-CM

## 2014-05-05 DIAGNOSIS — M1711 Unilateral primary osteoarthritis, right knee: Secondary | ICD-10-CM | POA: Diagnosis not present

## 2014-05-05 DIAGNOSIS — K219 Gastro-esophageal reflux disease without esophagitis: Secondary | ICD-10-CM | POA: Diagnosis not present

## 2014-05-05 DIAGNOSIS — I10 Essential (primary) hypertension: Secondary | ICD-10-CM | POA: Diagnosis not present

## 2014-05-05 DIAGNOSIS — D62 Acute posthemorrhagic anemia: Secondary | ICD-10-CM | POA: Diagnosis not present

## 2014-05-05 DIAGNOSIS — E785 Hyperlipidemia, unspecified: Secondary | ICD-10-CM | POA: Diagnosis not present

## 2014-05-06 NOTE — Progress Notes (Signed)
Patient ID: [name], female   DOB: 1925/06/15, 81 y.o.   MRN: [id number]     [facility]   PCP: Pcp Not In System  Code Status: full code  Allergies  Allergen Reactions  . Aspirin Nausea And Vomiting  . Codeine Nausea And Vomiting    Chief Complaint  Patient presents with  . New Admit To SNF     HPI:  81year old patient is here for short term rehabilitation post hospital admission with right OA. She underwent right total knee arthroplasty. She also received 2 u prbc for blood loss anemia post operatively. She is seen in her room today. She complaints of abdominal discomfort and nausea. She also had vomiting yesterday. She had a small bowel movement on Monday. She complaints of heartburn and mentions she normally takes prilosec empty stomach first thing in am and she is getting it here after breakfast. Denies any other concerns. He has pmh of htn, chf, gerd among others.  Review of Systems:  Constitutional: Negative for fever, chills  HENT: Negative for headache, congestion, nasal discharge Eyes: Negative for eye pain, blurred vision, double vision and discharge.  Respiratory: Negative for cough, shortness of breath and wheezing.   Cardiovascular: Negative for chest pain, palpitations  Gastrointestinal: see hpi Genitourinary: Negative for dysuria Musculoskeletal: Negative for back pain, falls Skin: Negative for itching, rash.  Neurological: Negative for dizziness, tingling, focal weakness Psychiatric/Behavioral: Negative for depression   Past Medical History  Diagnosis Date  . Hypertension   . Hyperlipidemia   . GERD (gastroesophageal reflux disease) 05/14/2011  . Arthritis     Both knees R>L  . Chronic diastolic congestive heart failure, NYHA class 2   . Severe aortic stenosis 12/06/2013  . Heart murmur   . Shortness of breath dyspnea   . Anxiety   . S/P TAVR (transcatheter aortic valve replacement) 01/19/2014    23 mm Edwards  Sapien 3 transcatheter heart valve placed via open right transfemoral approach   Past Surgical History  Procedure Laterality Date  . Cholecystectomy  2008  . Tonsillectomy      70 years ago  . Refractive surgery Bilateral   . Cataract extraction Bilateral   . Knee arthroscopy Bilateral   . Transcatheter aortic valve replacement, transfemoral N/A 01/19/2014    Procedure: TRANSCATHETER AORTIC VALVE REPLACEMENT, TRANSFEMORAL;  Surgeon: Sherren Mocha, MD;  Location: [place];  Service: Open Heart Surgery;  Laterality: N/A;  . Intraoperative transesophageal echocardiogram N/A 01/19/2014    Procedure: INTRAOPERATIVE TRANSESOPHAGEAL ECHOCARDIOGRAM;  Surgeon: Sherren Mocha, MD;  Location: [facility] OR;  Service: Open Heart Surgery;  Laterality: N/A;  . Left and right heart catheterization with coronary angiogram N/A 12/08/2013    Procedure: LEFT AND RIGHT HEART CATHETERIZATION WITH CORONARY ANGIOGRAM;  Surgeon: Sanda Klein, MD;  Location: [place] CATH LAB;  Service: Cardiovascular;  Laterality: N/A;  . Eye surgery    . Total knee arthroplasty Right 04/28/2014    Procedure: TOTAL KNEE ARTHROPLASTY;  Surgeon: Kathryne Hitch, MD;  Location: [place];  Service: Orthopedics;  Laterality: Right;   Social History:   reports that she has never smoked. She has never used smokeless tobacco. She reports that she does not drink alcohol or use illicit drugs.  Family History  Problem Relation Age of Onset  . Breast cancer Sister     2 sisters  . Colon cancer Neg Hx   . Muscular dystrophy Father     Died at 17  Medications: Patient's Medications  New Prescriptions   No medications on file  Previous Medications   AMLODIPINE (NORVASC) 10 MG TABLET    Take 10 mg by mouth daily.   BISACODYL (DULCOLAX) 5 MG EC TABLET    Take 1 tablet (5 mg total) by mouth daily as needed for moderate constipation.   CARVEDILOL (COREG) 12.5 MG TABLET    Take 12.5 mg by mouth 2 (two) times daily.    CHOLECALCIFEROL (VITAMIN D) 1000 UNITS  TABLET    Take 1,000 Units by mouth 2 (two) times daily.    CLOPIDOGREL (PLAVIX) 75 MG TABLET    Take 1 tablet (75 mg total) by mouth daily with breakfast.   FENOFIBRATE MICRONIZED (LOFIBRA) 200 MG CAPSULE    Take 200 mg by mouth daily.    FLUTICASONE (FLONASE) 50 MCG/ACT NASAL SPRAY    Place 1 spray into both nostrils 2 (two) times daily as needed (congestion).   HYDROCHLOROTHIAZIDE (HYDRODIURIL) 25 MG TABLET    Take 12.5 mg by mouth daily.   MORPHINE (MSIR) 15 MG TABLET    Take 1 tablet (15 mg total) by mouth every 4 (four) hours as needed for severe pain.   OMEPRAZOLE (PRILOSEC) 40 MG CAPSULE    Take 1 capsule (40 mg total) by mouth daily.   ONDANSETRON (ZOFRAN) 4 MG TABLET    Take 1 tablet (4 mg total) by mouth every 8 (eight) hours as needed for nausea or vomiting.  Modified Medications   No medications on file  Discontinued Medications   No medications on file     Physical Exam: Filed Vitals:   05/05/14 1745  BP: 141/56  Pulse: 84  Temp: 99 F (37.2 C)  Resp: 16  SpO2: 96%    General- elderly female, in no acute distress Head- normocephalic, atraumatic Throat- moist mucus membrane Neck- no cervical lymphadenopathy Cardiovascular- normal s1,s2, no murmurs, palpable dorsalis pedis and radial pulses, 1+ right leg edema Respiratory- bilateral clear to auscultation, no wheeze, no rhonchi, no crackles, no use of accessory muscles Abdomen- bowel sounds present, mild distension noted, non tender, no guarding or rigidity, no CVA tenderness Musculoskeletal- able to move all 4 extremities, generalized weakness, right knee limited range of motion  Neurological- no focal deficit Skin- warm and dry, right knee steri strips on surgical incision Psychiatry- alert and oriented to person, place and time, normal mood and affect    Labs reviewed: Basic Metabolic Panel:  Recent Labs  01/20/14 0318  04/29/14 0817 04/30/14 0508 05/01/14 0625  NA 138  < > 137 136 136  K 3.7  < > 3.7  3.9 3.3*  CL 103  < > 105 103 101  CO2 22  < > 28 25 29   GLUCOSE 95  < > 138* 130* 109*  BUN 11  < > 18 18 14   CREATININE 0.84  < > 1.12* 1.11* 0.90  CALCIUM 9.0  < > 8.6 8.9 9.1  MG 1.6  --   --   --   --   < > = values in this interval not displayed. Liver Function Tests:  Recent Labs  01/15/14 1236 04/19/14 1302  AST 17 21  ALT 17 17  ALKPHOS 56 50  BILITOT 0.3 0.6  PROT 8.2 7.3  ALBUMIN 3.9 3.8   No results for input(s): LIPASE, AMYLASE in the last 8760 hours. No results for input(s): AMMONIA in the last 8760 hours. CBC:  Recent Labs  04/19/14 1302  04/30/14 0508 04/30/14 2231  05/01/14 0625  WBC 6.1  < > 9.6 9.6 9.1  NEUTROABS 3.4  --   --   --   --   HGB 11.4*  < > 7.7* 10.6* 10.8*  HCT 34.2*  < > 23.8* 30.7* 32.2*  MCV 87.5  < > 87.2 86.2 87.3  PLT 351  < > 198 163 173  < > = values in this interval not displayed. Cardiac Enzymes: No results for input(s): CKTOTAL, CKMB, CKMBINDEX, TROPONINI in the last 8760 hours. BNP: Invalid input(s): POCBNP CBG:  Recent Labs  01/20/14 0337 01/20/14 0749 01/20/14 1139  GLUCAP 84 88 103*    Assessment/Plan  Right knee OA S/p right total knee arthroplasty. Has f/u with dr Percell Miller. Will have patient work with PT/OT as tolerated to regain strength and restore function.  Fall precautions are in place. Continue morphine 15 mg bid with 15 mg q4h prn for breakthrough pain. Is on plavix  Nausea Likely fro her gerd and not getting her morning prilosec  Constipation Contributing to abdominal discomfort. Change miralax to bid x 3 days, then daily, also change colace to bid and provide dulcolax x 1 today if no bowel movement by evening  gerd priloec 40 mg daily at 6 am  Blood loss anemia Post op with 2 u prbc transfusion in hospital, monitor h&h  HTN Elevated bp readings. continue norvasc 10 mg daily, coreg 12.5 mg bid and increase hctz to 25 mg daily. Monitor bp  Hyperlipidemia continue Lofibra 200 mg daily  S/P  TAVR continue Plavix 75 mg by mouth daily  Goals of care: short term rehabilitation   Labs/tests ordered: cbc, bmp  Family/ staff Communication: reviewed care plan with patient and nursing supervisor    Blanchie Serve, MD  [facility] [phone] (Monday-Friday 8 am - 5 pm) [phone] (afterhours)

## 2014-05-12 ENCOUNTER — Encounter: Payer: Self-pay | Admitting: Adult Health

## 2014-05-12 ENCOUNTER — Non-Acute Institutional Stay (SKILLED_NURSING_FACILITY): Payer: Medicare Other | Admitting: Adult Health

## 2014-05-12 DIAGNOSIS — Z952 Presence of prosthetic heart valve: Secondary | ICD-10-CM

## 2014-05-12 DIAGNOSIS — I10 Essential (primary) hypertension: Secondary | ICD-10-CM

## 2014-05-12 DIAGNOSIS — K219 Gastro-esophageal reflux disease without esophagitis: Secondary | ICD-10-CM

## 2014-05-12 DIAGNOSIS — D62 Acute posthemorrhagic anemia: Secondary | ICD-10-CM | POA: Diagnosis not present

## 2014-05-12 DIAGNOSIS — Z954 Presence of other heart-valve replacement: Secondary | ICD-10-CM | POA: Diagnosis not present

## 2014-05-12 DIAGNOSIS — I5032 Chronic diastolic (congestive) heart failure: Secondary | ICD-10-CM

## 2014-05-12 DIAGNOSIS — M1711 Unilateral primary osteoarthritis, right knee: Secondary | ICD-10-CM

## 2014-05-12 NOTE — Progress Notes (Signed)
Patient ID: [name], female   DOB: 1925/03/14, 81 y.o.   MRN: [id number]   05/05/14  Facility:  Nursing Home Location:  [place] Room Number: [id number] LEVEL OF CARE:  SNF (31)   Chief Complaint  Patient presents with  . Discharge Note    DJD S/P right total knee arthroplasty, hypertension, anemia, GERD, S/P TAVR and chronic diastolic CHF    HISTORY OF PRESENT ILLNESS:  This is an 81 year old female who is for discharge home with home health PT for angiograms and nursing for medication management. She has been admitted to [facility] on 05/01/14 from [facility] with DJD S/P right total knee arthroplasty. She has PMH of hyperlipidemia, hypertension, GERD, chronic diastolic CHF and anxiety.   Patient was admitted to this facility for short-term rehabilitation after the patient's recent hospitalization.  Patient has completed SNF rehabilitation and therapy has cleared the patient for discharge.  PAST MEDICAL HISTORY:  Past Medical History  Diagnosis Date  . Hypertension   . Hyperlipidemia   . GERD (gastroesophageal reflux disease) 05/14/2011  . Arthritis     Both knees R>L  . Chronic diastolic congestive heart failure, NYHA class 2   . Severe aortic stenosis 12/06/2013  . Heart murmur   . Shortness of breath dyspnea   . Anxiety   . S/P TAVR (transcatheter aortic valve replacement) 01/19/2014    23 mm Edwards Sapien 3 transcatheter heart valve placed via open right transfemoral approach    CURRENT MEDICATIONS: Reviewed per MAR/see medication list  Allergies  Allergen Reactions  . Aspirin Nausea And Vomiting  . Codeine Nausea And Vomiting     REVIEW OF SYSTEMS:  GENERAL: no change in appetite, no fatigue, no weight changes, no fever, chills or weakness RESPIRATORY: no cough, SOB, DOE, wheezing, hemoptysis CARDIAC: no chest pain, edema or palpitations GI: no abdominal pain, diarrhea, constipation, heart burn, nausea or  vomiting  PHYSICAL EXAMINATION  GENERAL: no acute distress, normal body habitus SKIN:  Right knee surgical wound steri-strips, no redness NECK: supple, trachea midline, no neck masses, no thyroid tenderness, no thyromegaly LYMPHATICS: no LAN in the neck, no supraclavicular LAN RESPIRATORY: breathing is even & unlabored, BS CTAB CARDIAC: RRR, no murmur,no extra heart sounds, no edema GI: abdomen soft, normal BS, no masses, no tenderness, no hepatomegaly, no splenomegaly EXTREMITIES:  Able to move 4 extremities PSYCHIATRIC: the patient is alert & oriented to person, affect & behavior appropriate  LABS/RADIOLOGY: 05/06/14  WBC 6.0 hemoglobin 10.8 hematocrit 32.4 MCV 88.3 sodium 136 potassium 4.0 glucose 101 BUN 23 creatinine 0.99 calcium 9.8 Labs reviewed: Basic Metabolic Panel:  Recent Labs  01/20/14 0318  04/29/14 0817 04/30/14 0508 05/01/14 0625  NA 138  < > 137 136 136  K 3.7  < > 3.7 3.9 3.3*  CL 103  < > 105 103 101  CO2 22  < > 28 25 29   GLUCOSE 95  < > 138* 130* 109*  BUN 11  < > 18 18 14   CREATININE 0.84  < > 1.12* 1.11* 0.90  CALCIUM 9.0  < > 8.6 8.9 9.1  MG 1.6  --   --   --   --   < > = values in this interval not displayed. Liver Function Tests:  Recent Labs  01/15/14 1236 04/19/14 1302  AST 17 21  ALT 17 17  ALKPHOS 56 50  BILITOT 0.3 0.6  PROT 8.2 7.3  ALBUMIN 3.9 3.8  CBC:  Recent Labs  04/19/14 1302  04/30/14 0508 04/30/14 2231 05/01/14 0625  WBC 6.1  < > 9.6 9.6 9.1  NEUTROABS 3.4  --   --   --   --   HGB 11.4*  < > 7.7* 10.6* 10.8*  HCT 34.2*  < > 23.8* 30.7* 32.2*  MCV 87.5  < > 87.2 86.2 87.3  PLT 351  < > 198 163 173  < > = values in this interval not displayed.  CBG:  Recent Labs  01/20/14 0337 01/20/14 0749 01/20/14 1139  GLUCAP 84 88 103*    Dg Knee Right Port  04/28/2014   CLINICAL DATA:  Postop total right knee replacement.  EXAM: PORTABLE RIGHT KNEE - 1-2 VIEW  COMPARISON:  MRI 11/09/2005  FINDINGS: Changes of right  knee replacement. No hardware or bony complicating feature. Soft tissue drain in place. Soft tissue and joint space gas present.  IMPRESSION: Right knee replacement.  No complicating feature.   Electronically Signed   By: Rolm Baptise M.D.   On: 04/28/2014 14:38    ASSESSMENT/PLAN:  DJD S/P right total knee arthroplasty - for home health PT and nursing; continue morphine 15 mg 1 tab by mouth twice a day Hypertension - continue Norvasc 10 mg 1 tab by mouth daily, recently increased dosage of Hydrodiuril 25 mg by mouth daily and Coreg 12.5 mg by mouth twice a day GERD - continue Prilosec 40 mg by mouth daily Chronic diastolic CHF - stable S/P TAVR - continue Plavix 75 mg by mouth daily Anemia, acute blood loss - hemoglobin 10.8; stable    I have filled out patient's discharge paperwork and written prescriptions.  Patient will receive home health PT and Nursing.  Total discharge time: Greater than 30 minutes  Discharge time involved coordination of the discharge process with social worker, nursing staff and therapy department. Medical justification for home health services verified.    [facility], NP [employer] [phone]

## 2014-05-12 NOTE — Progress Notes (Signed)
Patient ID: [name], female   DOB: February 01, 1926, 81 y.o.   MRN: [id number]   05/03/14  Facility:  Nursing Home Location:  [place] Room Number: [id number] LEVEL OF CARE:  SNF (31)   Chief Complaint  Patient presents with  . Hospitalization Follow-up    DJD S/P right total knee arthroplasty, hypertension, anemia, hyperlipidemia, GERD, S/P TAVR and chronic diastolic CHF    HISTORY OF PRESENT ILLNESS:  This is an 81 year old female who has been admitted to [facility] on 05/01/14 from [facility] with DJD S/P right total knee arthroplasty. She has PMH of hyperlipidemia, hypertension, GERD, chronic diastolic CHF and anxiety.   She has been admitted for a short-term rehabilitation.  PAST MEDICAL HISTORY:  Past Medical History  Diagnosis Date  . Hypertension   . Hyperlipidemia   . GERD (gastroesophageal reflux disease) 05/14/2011  . Arthritis     Both knees R>L  . Chronic diastolic congestive heart failure, NYHA class 2   . Severe aortic stenosis 12/06/2013  . Heart murmur   . Shortness of breath dyspnea   . Anxiety   . S/P TAVR (transcatheter aortic valve replacement) 01/19/2014    23 mm Edwards Sapien 3 transcatheter heart valve placed via open right transfemoral approach    CURRENT MEDICATIONS: Reviewed per MAR/see medication list  Allergies  Allergen Reactions  . Aspirin Nausea And Vomiting  . Codeine Nausea And Vomiting     REVIEW OF SYSTEMS:  GENERAL: no change in appetite, no fatigue, no weight changes, no fever, chills or weakness RESPIRATORY: no cough, SOB, DOE, wheezing, hemoptysis CARDIAC: no chest pain, edema or palpitations GI: no abdominal pain, diarrhea, constipation, heart burn, nausea or vomiting  PHYSICAL EXAMINATION  GENERAL: no acute distress, normal body habitus SKIN:  Right knee surgical wound is covered with dry dressing, no redness EYES: conjunctivae normal, sclerae normal, normal eye lids NECK: supple,  trachea midline, no neck masses, no thyroid tenderness, no thyromegaly LYMPHATICS: no LAN in the neck, no supraclavicular LAN RESPIRATORY: breathing is even & unlabored, BS CTAB CARDIAC: RRR, no murmur,no extra heart sounds, no edema GI: abdomen soft, normal BS, no masses, no tenderness, no hepatomegaly, no splenomegaly EXTREMITIES:  Able to move 4 extremities PSYCHIATRIC: the patient is alert & oriented to person, affect & behavior appropriate  LABS/RADIOLOGY: Labs reviewed: Basic Metabolic Panel:  Recent Labs  01/20/14 0318  04/29/14 0817 04/30/14 0508 05/01/14 0625  NA 138  < > 137 136 136  K 3.7  < > 3.7 3.9 3.3*  CL 103  < > 105 103 101  CO2 22  < > 28 25 29   GLUCOSE 95  < > 138* 130* 109*  BUN 11  < > 18 18 14   CREATININE 0.84  < > 1.12* 1.11* 0.90  CALCIUM 9.0  < > 8.6 8.9 9.1  MG 1.6  --   --   --   --   < > = values in this interval not displayed. Liver Function Tests:  Recent Labs  01/15/14 1236 04/19/14 1302  AST 17 21  ALT 17 17  ALKPHOS 56 50  BILITOT 0.3 0.6  PROT 8.2 7.3  ALBUMIN 3.9 3.8   CBC:  Recent Labs  04/19/14 1302  04/30/14 0508 04/30/14 2231 05/01/14 0625  WBC 6.1  < > 9.6 9.6 9.1  NEUTROABS 3.4  --   --   --   --   HGB 11.4*  < >  7.7* 10.6* 10.8*  HCT 34.2*  < > 23.8* 30.7* 32.2*  MCV 87.5  < > 87.2 86.2 87.3  PLT 351  < > 198 163 173  < > = values in this interval not displayed.  CBG:  Recent Labs  01/20/14 0337 01/20/14 0749 01/20/14 1139  GLUCAP 84 88 103*    Dg Knee Right Port  04/28/2014   CLINICAL DATA:  Postop total right knee replacement.  EXAM: PORTABLE RIGHT KNEE - 1-2 VIEW  COMPARISON:  MRI 11/09/2005  FINDINGS: Changes of right knee replacement. No hardware or bony complicating feature. Soft tissue drain in place. Soft tissue and joint space gas present.  IMPRESSION: Right knee replacement.  No complicating feature.   Electronically Signed   By: Rolm Baptise M.D.   On: 04/28/2014 14:38     ASSESSMENT/PLAN:  DJD S/P right total knee arthroplasty - for rehabilitation; continue morphine 15 mg 1 tab by mouth every 4 hours when necessary and start morphine 15 mg 1 tab by mouth twice a day Hypertension - continue Norvasc 10 mg 1 tab by mouth daily, Hydrodiuril 12.5 mg by mouth daily and Coreg 12.5 mg by mouth twice a day Hyperlipidemia - continue Lofibra 200 mg by mouth daily GERD - continue Prilosec 40 mg by mouth daily Chronic diastolic CHF - stable S/P TAVR - continue Plavix 75 mg by mouth daily Anemia, acute blood loss - hemoglobin 7.7; will monitor    Goals of care:  Short-term rehabilitation   Labs/test ordered:  none  Spent 50 minutes in patient care.    [facility], NP [employer] [phone]

## 2014-05-26 ENCOUNTER — Ambulatory Visit: Payer: Medicare Other | Attending: Orthopedic Surgery | Admitting: Physical Therapy

## 2014-05-26 ENCOUNTER — Encounter: Payer: Self-pay | Admitting: Physical Therapy

## 2014-05-26 DIAGNOSIS — M25561 Pain in right knee: Secondary | ICD-10-CM | POA: Diagnosis not present

## 2014-05-26 DIAGNOSIS — R262 Difficulty in walking, not elsewhere classified: Secondary | ICD-10-CM | POA: Diagnosis present

## 2014-05-26 DIAGNOSIS — M25661 Stiffness of right knee, not elsewhere classified: Secondary | ICD-10-CM | POA: Insufficient documentation

## 2014-05-26 NOTE — Therapy (Signed)
[place] [address] [place], [place], [address] Phone: [phone]   Fax:  [phone]  Physical Therapy Evaluation  Patient Details  Name: [name] MRN: [id number] Date of Birth: 1925-03-11 Referring Provider:  Kathryne Hitch, MD  Encounter Date: 05/26/2014      PT End of Session - 05/26/14 1325    Visit Number 1   Date for PT Re-Evaluation 07/26/14   PT Start Time 1300   PT Stop Time 1400   PT Time Calculation (min) 60 min      Past Medical History  Diagnosis Date  . Hypertension   . Hyperlipidemia   . GERD (gastroesophageal reflux disease) 05/14/2011  . Arthritis     Both knees R>L  . Chronic diastolic congestive heart failure, NYHA class 2   . Severe aortic stenosis 12/06/2013  . Heart murmur   . Shortness of breath dyspnea   . Anxiety   . 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 Cholecystectomy  2008  . Tonsillectomy      70 years ago  . Refractive surgery Bilateral   . Cataract extraction Bilateral   . Knee arthroscopy Bilateral   . Transcatheter aortic valve replacement, transfemoral N/A 01/19/2014    Procedure: TRANSCATHETER AORTIC VALVE REPLACEMENT, TRANSFEMORAL;  Surgeon: Sherren Mocha, MD;  Location: [place];  Service: Open Heart Surgery;  Laterality: N/A;  . Intraoperative transesophageal echocardiogram N/A 01/19/2014    Procedure: INTRAOPERATIVE TRANSESOPHAGEAL ECHOCARDIOGRAM;  Surgeon: Sherren Mocha, MD;  Location: [facility] OR;  Service: Open Heart Surgery;  Laterality: N/A;  . Left and right heart catheterization with coronary angiogram N/A 12/08/2013    Procedure: LEFT AND RIGHT HEART CATHETERIZATION WITH CORONARY ANGIOGRAM;  Surgeon: Sanda Klein, MD;  Location: [place] CATH LAB;  Service: Cardiovascular;  Laterality: N/A;   . Eye surgery    . Total knee arthroplasty Right 04/28/2014    Procedure: TOTAL KNEE ARTHROPLASTY;  Surgeon: Kathryne Hitch, MD;  Location: [place];  Service: Orthopedics;  Laterality: Right;    There were no vitals filed for this visit.  Visit Diagnosis:  Difficulty walking - Plan: PT plan of care cert/re-cert  Right knee pain - Plan: PT plan of care cert/re-cert  Knee stiffness, right - Plan: PT plan of care cert/re-cert      Subjective Assessment - 05/26/14 1304    Subjective Patient reports that she underwent a right TKR on 04/28/14.  She was in [place] rehab center for 13 days, then some [place].  Reports only issue is nausea.   Pertinent History Heart surgery TVAD 01/19/14.     Limitations Lifting;Standing;Walking;House hold activities   How long can you stand comfortably? 5   How long can you walk comfortably? 5   Patient Stated Goals walk and do everyting without difficulty   Currently in Pain? Yes   Pain Score 4    Pain Location Knee   Pain Orientation Right   Pain Descriptors / Indicators Aching   Pain Type Surgical pain   Pain Onset 1 to 4 weeks ago   Pain Frequency Constant   Aggravating Factors  standing, walking, doing housework   Pain Relieving Factors rest, pain meds   Effect of Pain on Daily Activities limited with ADL's  [facility] PT Assessment - 05/26/14 0001    Assessment   Medical Diagnosis S/P right TKR   Onset Date 04/28/14   Prior Therapy yes   Precautions   Precautions None   Restrictions   Weight Bearing Restrictions No   Balance Screen   Has the patient fallen in the past 6 months No   Has the patient had a decrease in activity level because of a fear of falling?  No   Home Environment   Living Enviornment Private residence   Living Arrangements Alone   Additional Comments no stairs   Prior Function   Level of Independence Independent with basic ADLs;Independent with homemaking with ambulation   Leisure some gardening   AROM    Overall AROM Comments AROM of the right knee at edge of bed 4-93 degrees flexion   PROM   PROM Assessment Site --  PROM 3-104 degrees flexion   Strength   Overall Strength Comments 4-/5   Palpation   Palpation mild warmth, scar is mobile, steristrips in place,    Special Tests    Special Tests --  quad lag and ballotable patella   Ambulation/Gait   Gait Comments not using an assistive device,today but reports that she has been using SPC for all ambulation, mild antalgic on the right, shortend step length and knee held in flexion                   [facility] Adult PT Treatment/Exercise - 05/26/14 0001    Knee/Hip Exercises: Aerobic   Stationary Bike x 6 minutes   Tread Mill NuStep L5 x 6 mins   Knee/Hip Exercises: Machines for Strengthening   Cybex Knee Extension 5# 2x15   Cybex Knee Flexion 20# 2x15                  PT Short Term Goals - 05/26/14 1327    PT SHORT TERM GOAL #1   Title independent with initial HEP   Time 2   Period Weeks   Status New           PT Long Term Goals - 05/26/14 1327    PT LONG TERM GOAL #1   Title independent with RICE   Time 8   Period Weeks   Status New   PT LONG TERM GOAL #2   Title increase AROM of the right knee to 3-113 degrees flexion   Time 8   Period Weeks   Status New   PT LONG TERM GOAL #3   Title walk all distances without difficulty   Time 8   Period Weeks   Status New   PT LONG TERM GOAL #4   Title stand to do housework without difficulty   Time 8   Period Weeks   Status New               Plan - 05/26/14 1326    Clinical Impression Statement Patient with TKR on the right on 04/28/14.  Mild gait deviation, some swelling and AROM was 5-93 degrees flexion   Pt will benefit from skilled therapeutic intervention in order to improve on the following deficits Abnormal gait;Decreased endurance;Decreased mobility;Decreased range of motion;Decreased strength;Difficulty walking;Increased edema;Impaired  flexibility;Pain   Rehab Potential Good   PT Frequency 2x / week   PT Duration 8 weeks   PT Treatment/Interventions Cryotherapy;Ultrasound;Moist Heat;Electrical Stimulation;Gait training;Stair training;Functional mobility training;Neuromuscular re-education;Balance training;Therapeutic exercise;Patient/family education;Manual techniques;Passive range of motion;Scar mobilization   PT Next Visit Plan add exercises  and gait          G-Codes - 05/26/14 1329    Functional Assessment Tool Used FOTO   Functional Limitation Mobility: Walking and moving around   Mobility: Walking and Moving Around Current Status [phone]) At least 60 percent but less than 80 percent impaired, limited or restricted   Mobility: Walking and Moving Around Goal Status [phone]) At least 40 percent but less than 60 percent impaired, limited or restricted       Problem List Patient Active Problem List   Diagnosis Date Noted  . DJD (degenerative joint disease) of knee 04/28/2014  . 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 Severe aortic valve stenosis 01/19/2014  . Aortic stenosis 12/14/2013  . Arthritis   . Chronic diastolic congestive heart failure, NYHA class 2   . Severe aortic stenosis 12/06/2013  . Essential hypertension 11/18/2013  . Hyperlipidemia 11/18/2013  . Aortic heart murmur 11/18/2013  . Preoperative clearance 11/18/2013  . GERD (gastroesophageal reflux disease) 05/14/2011  . Dysphagia 05/14/2011    Sumner Boast, PT 05/26/2014, 1:37 PM  [place] [address] [place], [place], [address] Phone: [phone]   Fax:  [phone]

## 2014-05-31 ENCOUNTER — Ambulatory Visit: Payer: Medicare Other | Admitting: Physical Therapy

## 2014-05-31 DIAGNOSIS — M25661 Stiffness of right knee, not elsewhere classified: Secondary | ICD-10-CM

## 2014-05-31 DIAGNOSIS — R262 Difficulty in walking, not elsewhere classified: Secondary | ICD-10-CM

## 2014-05-31 DIAGNOSIS — M25561 Pain in right knee: Secondary | ICD-10-CM

## 2014-05-31 NOTE — Therapy (Signed)
[place], [place], [address] Phone: [phone]   Fax:  [phone]  Physical Therapy Treatment  Patient Details  Name: BARRIE STOLDT MRN: [id number] Date of Birth: 08-20-1925 Referring Provider:  Kathryne Hitch, MD  Encounter Date: 05/31/2014      PT End of Session - 05/31/14 1143    Visit Number 2   Date for PT Re-Evaluation 07/26/14   PT Start Time 1100   PT Stop Time 1152   PT Time Calculation (min) 52 min   Activity Tolerance Patient tolerated treatment well   Behavior During Therapy [facility] for tasks assessed/performed      Past Medical History  Diagnosis Date  . Hypertension   . Hyperlipidemia   . GERD (gastroesophageal reflux disease) 05/14/2011  . Arthritis     Both knees R>L  . Chronic diastolic congestive heart failure, NYHA class 2   . Severe aortic stenosis 12/06/2013  . Heart murmur   . Shortness of breath dyspnea   . Anxiety   . 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 Cholecystectomy  2008  . Tonsillectomy      70 years ago  . Refractive surgery Bilateral   . Cataract extraction Bilateral   . Knee arthroscopy Bilateral   . Transcatheter aortic valve replacement, transfemoral N/A 01/19/2014    Procedure: TRANSCATHETER AORTIC VALVE REPLACEMENT, TRANSFEMORAL;  Surgeon: Sherren Mocha, MD;  Location: [place];  Service: Open Heart Surgery;  Laterality: N/A;  . Intraoperative transesophageal echocardiogram N/A 01/19/2014    Procedure: INTRAOPERATIVE TRANSESOPHAGEAL ECHOCARDIOGRAM;  Surgeon: Sherren Mocha, MD;  Location: [facility] OR;  Service: Open Heart Surgery;  Laterality: N/A;  . Left and right heart catheterization with coronary angiogram N/A 12/08/2013    Procedure: LEFT AND RIGHT HEART CATHETERIZATION WITH  CORONARY ANGIOGRAM;  Surgeon: Sanda Klein, MD;  Location: [place] CATH LAB;  Service: Cardiovascular;  Laterality: N/A;  . Eye surgery    . Total knee arthroplasty Right 04/28/2014    Procedure: TOTAL KNEE ARTHROPLASTY;  Surgeon: Kathryne Hitch, MD;  Location: [place];  Service: Orthopedics;  Laterality: Right;    There were no vitals filed for this visit.  Visit Diagnosis:  Difficulty walking  Right knee pain  Knee stiffness, right      Subjective Assessment - 05/31/14 1104    Subjective R knee has been more painful since last visit.    Currently in Pain? Yes   Pain Score 5    Pain Location Knee   Pain Orientation Right   Pain Descriptors / Indicators Aching   Pain Type Surgical pain   Pain Onset More than a month ago                       [facility] Adult PT Treatment/Exercise - 05/31/14 1105    Knee/Hip Exercises: Aerobic   Stationary Bike 8 min for ROM   Knee/Hip Exercises: Machines for Strengthening   Cybex Knee Extension 15# 3x10   Cybex Knee Flexion 20# 3x10   Knee/Hip Exercises: Standing   Forward Step Up 2 sets;10 reps;Right;Hand Hold: 0;Step Height: 6"   Forward Step Up Limitations min A   Knee/Hip Exercises: Supine   Quad Sets Strengthening;Right;2 sets;10 reps   Heel Slides AAROM;Right;2 sets;10 reps   Heel Slides  Limitations with strap   Modalities   Modalities Cryotherapy   Cryotherapy   Number Minutes Cryotherapy 10 Minutes   Cryotherapy Location Knee   Type of Cryotherapy Ice pack                  PT Short Term Goals - 05/31/14 1143    PT SHORT TERM GOAL #1   Title independent with initial HEP   Time 2   Period Weeks   Status On-going           PT Long Term Goals - 05/31/14 1144    PT LONG TERM GOAL #1   Title independent with RICE   Time 8   Period Weeks   Status On-going   PT LONG TERM GOAL #2   Title increase AROM of the right knee to 3-113 degrees flexion   Time 8   Period Weeks   Status On-going   PT LONG TERM  GOAL #3   Title walk all distances without difficulty   Period Weeks   Status On-going   PT LONG TERM GOAL #4   Title stand to do housework without difficulty   Time 8   Period Weeks   Status On-going               Plan - 05/31/14 1143    Clinical Impression Statement Tolerated exercises well today.  Continues to demonstrate mild gait deviations due to decreased extension.   PT Next Visit Plan HEP, ROM/strengthening   Consulted and Agree with Plan of Care Patient        Problem List Patient Active Problem List   Diagnosis Date Noted  . DJD (degenerative joint disease) of knee 04/28/2014  . 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 Severe aortic valve stenosis 01/19/2014  . Aortic stenosis 12/14/2013  . Arthritis   . Chronic diastolic congestive heart failure, NYHA class 2   . Severe aortic stenosis 12/06/2013  . Essential hypertension 11/18/2013  . Hyperlipidemia 11/18/2013  . Aortic heart murmur 11/18/2013  . Preoperative clearance 11/18/2013  . GERD (gastroesophageal reflux disease) 05/14/2011  . Dysphagia 05/14/2011   Laureen Abrahams, PT, DPT 05/31/2014 11:59 AM  [place] [address] [address] [place], [place], [address] Phone: [phone]   Fax:  [phone]

## 2014-06-03 ENCOUNTER — Ambulatory Visit: Payer: Medicare Other | Admitting: Physical Therapy

## 2014-06-03 ENCOUNTER — Encounter: Payer: Self-pay | Admitting: Physical Therapy

## 2014-06-03 DIAGNOSIS — R262 Difficulty in walking, not elsewhere classified: Secondary | ICD-10-CM

## 2014-06-03 NOTE — Therapy (Signed)
[place], [place], [address] Phone: [phone]   Fax:  [phone]  Physical Therapy Treatment  Patient Details  Name: [name] MRN: [id number] Date of Birth: [birth year] Referring Provider:  Kathryne Hitch, MD  Encounter Date: 06/03/2014      PT End of Session - 06/03/14 1530    Visit Number 3   PT Start Time 8768   PT Stop Time 1535   PT Time Calculation (min) 40 min      Past Medical History  Diagnosis Date  . Hypertension   . Hyperlipidemia   . GERD (gastroesophageal reflux disease) 05/14/2011  . Arthritis     Both knees R>L  . Chronic diastolic congestive heart failure, NYHA class 2   . Severe aortic stenosis 12/06/2013  . Heart murmur   . Shortness of breath dyspnea   . Anxiety   . 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 Cholecystectomy  2008  . Tonsillectomy      70 years ago  . Refractive surgery Bilateral   . Cataract extraction Bilateral   . Knee arthroscopy Bilateral   . Transcatheter aortic valve replacement, transfemoral N/A 01/19/2014    Procedure: TRANSCATHETER AORTIC VALVE REPLACEMENT, TRANSFEMORAL;  Surgeon: Sherren Mocha, MD;  Location: [place];  Service: Open Heart Surgery;  Laterality: N/A;  . Intraoperative transesophageal echocardiogram N/A 01/19/2014    Procedure: INTRAOPERATIVE TRANSESOPHAGEAL ECHOCARDIOGRAM;  Surgeon: Sherren Mocha, MD;  Location: [facility] OR;  Service: Open Heart Surgery;  Laterality: N/A;  . Left and right heart catheterization with coronary angiogram N/A 12/08/2013    Procedure: LEFT AND RIGHT HEART CATHETERIZATION WITH CORONARY ANGIOGRAM;  Surgeon: Sanda Klein, MD;  Location: [place] CATH LAB;  Service: Cardiovascular;  Laterality: N/A;  . Eye surgery    . Total knee  arthroplasty Right 04/28/2014    Procedure: TOTAL KNEE ARTHROPLASTY;  Surgeon: Kathryne Hitch, MD;  Location: [place];  Service: Orthopedics;  Laterality: Right;    There were no vitals filed for this visit.  Visit Diagnosis:  Difficulty walking      Subjective Assessment - 06/03/14 1458    Subjective very sore after last session, pain is worse at night. doing well over all. Afraid I can not keep coming long at $40 copay.   Currently in Pain? No/denies            [facility] PT Assessment - 06/03/14 0001    AROM   Overall AROM Comments AROM of the right knee seated 3-115 degrees flexion   Strength   Overall Strength Comments WFLs                   [facility] Adult PT Treatment/Exercise - 06/03/14 0001    Ambulation/Gait   Gait Comments gait 1000 fet without AD, minimal cuing for heel strike   Knee/Hip Exercises: Aerobic   Stationary Bike Bike 6 min   Tread Mill NuStep L5 x 6 mins                PT Education - 06/03/14 1530    Education provided Yes   Education Details continue with standing HEP from HHPT and ride stationary bike   Person(s) Educated Patient   Methods Explanation;Demonstration   Comprehension Verbalized understanding;Returned demonstration  PT Short Term Goals - 06/03/14 1531    PT SHORT TERM GOAL #1   Title independent with initial HEP   Status Achieved           PT Long Term Goals - 06/03/14 1532    PT LONG TERM GOAL #1   Title independent with RICE   Status Achieved   PT LONG TERM GOAL #2   Title increase AROM of the right knee to 3-113 degrees flexion   Status Achieved   PT LONG TERM GOAL #3   Title walk all distances without difficulty   Status Achieved   PT LONG TERM GOAL #4   Title stand to do housework without difficulty   Status Achieved               Plan - 06/03/14 1531    Clinical Impression Statement Goals met. D/C at pt request d/t copay. Continue with HEPs   PT Next Visit Plan D/C today         Problem List Patient Active Problem List   Diagnosis Date Noted  . DJD (degenerative joint disease) of knee 04/28/2014  . 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 Severe aortic valve stenosis 01/19/2014  . Aortic stenosis 12/14/2013  . Arthritis   . Chronic diastolic congestive heart failure, NYHA class 2   . Severe aortic stenosis 12/06/2013  . Essential hypertension 11/18/2013  . Hyperlipidemia 11/18/2013  . Aortic heart murmur 11/18/2013  . Preoperative clearance 11/18/2013  . GERD (gastroesophageal reflux disease) 05/14/2011  . Dysphagia 05/14/2011    PAYSEUR,ANGIE PT Lum Babe PT 06/03/2014, 3:33 PM  [place] [address] [place] [place], [place], [address] Phone: [phone]   Fax:  [phone]

## 2014-06-07 ENCOUNTER — Ambulatory Visit: Payer: Medicare Other

## 2014-06-14 ENCOUNTER — Ambulatory Visit (INDEPENDENT_AMBULATORY_CARE_PROVIDER_SITE_OTHER): Payer: Medicare Other | Admitting: Cardiovascular Disease

## 2014-06-14 ENCOUNTER — Encounter: Payer: Self-pay | Admitting: Cardiovascular Disease

## 2014-06-14 VITALS — BP 140/70 | HR 68 | Resp 16 | Ht 64.0 in | Wt 138.8 lb

## 2014-06-14 DIAGNOSIS — I10 Essential (primary) hypertension: Secondary | ICD-10-CM

## 2014-06-14 DIAGNOSIS — I5032 Chronic diastolic (congestive) heart failure: Secondary | ICD-10-CM | POA: Diagnosis not present

## 2014-06-14 DIAGNOSIS — Z954 Presence of other heart-valve replacement: Secondary | ICD-10-CM

## 2014-06-14 DIAGNOSIS — E785 Hyperlipidemia, unspecified: Secondary | ICD-10-CM

## 2014-06-14 DIAGNOSIS — Z952 Presence of prosthetic heart valve: Secondary | ICD-10-CM

## 2014-06-14 NOTE — Progress Notes (Signed)
Patient ID: [name], female   DOB: 22-Apr-1925, 81 y.o.   MRN: [id number]     Cardiology Office Note   Date:  06/16/2014   ID:  [name], DOB [birth year], MRN [id number]  PCP:  Pcp Not In System  Cardiologist:   Sanda Klein, MD   Chief Complaint  Patient presents with  . Follow-up    3 months:  After getting up in the AM takes meds and eats breakfast but feels so bad she usually lays back down until lunch since knee surgery 04/28/14.  No complaints of chest pain, SOB, edema or dizziness.Has been taking Omeprazole for reflux but changed to Pantoprazole due to interaction with med but states the Pantoprazole does not help.      History of Present Illness: [name] is a 81 y.o. female who presents for f/u almost 5 months after TAVR (Sapien 3, 23 mm, January 19, 2014). January echo shows peak and mean gradients of 28 and 17 mm Hg, respectively, no perivalvular leak. LVEF 65-70%.  Since then she has had a good result from total right knee replacement surgery and 6 weeks out is not using either a cane or walker.  She generally feels well, but complains of mid-morning fatigue. She was switched due to drug-interaction concerns from omeprazole to pantoprazole, but the latter does not offer the same relief.   Past Medical History  Diagnosis Date  . Hypertension   . Hyperlipidemia   . GERD (gastroesophageal reflux disease) 05/14/2011  . Arthritis     Both knees R>L  . Chronic diastolic congestive heart failure, NYHA class 2   . Severe aortic stenosis 12/06/2013  . Heart murmur   . Shortness of breath dyspnea   . Anxiety   . 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 Cholecystectomy  2008  . Tonsillectomy      70 years ago  . Refractive surgery Bilateral   . Cataract extraction Bilateral   . Knee  arthroscopy Bilateral   . Transcatheter aortic valve replacement, transfemoral N/A 01/19/2014    Procedure: TRANSCATHETER AORTIC VALVE REPLACEMENT, TRANSFEMORAL;  Surgeon: Sherren Mocha, MD;  Location: [place];  Service: Open Heart Surgery;  Laterality: N/A;  . Intraoperative transesophageal echocardiogram N/A 01/19/2014    Procedure: INTRAOPERATIVE TRANSESOPHAGEAL ECHOCARDIOGRAM;  Surgeon: Sherren Mocha, MD;  Location: [facility] OR;  Service: Open Heart Surgery;  Laterality: N/A;  . Left and right heart catheterization with coronary angiogram N/A 12/08/2013    Procedure: LEFT AND RIGHT HEART CATHETERIZATION WITH CORONARY ANGIOGRAM;  Surgeon: Sanda Klein, MD;  Location: [place] CATH LAB;  Service: Cardiovascular;  Laterality: N/A;  . Eye surgery    . Total knee arthroplasty Right 04/28/2014    Procedure: TOTAL KNEE ARTHROPLASTY;  Surgeon: Kathryne Hitch, MD;  Location: [place];  Service: Orthopedics;  Laterality: Right;     Current Outpatient Prescriptions  Medication Sig Dispense Refill  . amLODipine (NORVASC) 10 MG tablet Take 10 mg by mouth daily.    . carvedilol (COREG) 12.5 MG tablet Take 12.5 mg by mouth 2 (two) times daily. Take 1/2 tablet in the AM and 1 tablet in the PM.    . cholecalciferol (VITAMIN D) 1000 UNITS tablet Take 1,000 Units by mouth 2 (two) times daily.     . clopidogrel (PLAVIX) 75 MG tablet Take 1 tablet (  75 mg total) by mouth daily with breakfast. 30 tablet 10  . fenofibrate micronized (LOFIBRA) 200 MG capsule Take 200 mg by mouth daily.     . fluticasone (FLONASE) 50 MCG/ACT nasal spray Place 1 spray into both nostrils 2 (two) times daily as needed (congestion).    . hydrochlorothiazide (HYDRODIURIL) 25 MG tablet Take 12.5 mg by mouth daily.    Marland Kitchen omeprazole (PRILOSEC) 40 MG capsule Take 1 capsule (40 mg total) by mouth daily.     No current facility-administered medications for this visit.    Allergies:   Aspirin and Codeine    Social History:  The patient  reports that she  has never smoked. She has never used smokeless tobacco. She reports that she does not drink alcohol or use illicit drugs.   Family History:  [name] includes Breast cancer in her sister; Muscular dystrophy in her father. There is no history of Colon cancer.    ROS:  Please see the history of present illness.    Otherwise, review of systems positive for none.   All other systems are reviewed and negative.    PHYSICAL EXAM: VS:  BP 140/70 mmHg  Pulse 68  Resp 16  Ht 5\' 4"  (1.626 m)  Wt 138 lb 12.8 oz (62.959 kg)  BMI 23.81 kg/m2 , BMI Body mass index is 23.81 kg/(m^2).  General: Alert, oriented x3, no distress Head: no evidence of trauma, PERRL, EOMI, no exophtalmos or lid lag, no myxedema, no xanthelasma; normal ears, nose and oropharynx Neck: normal jugular venous pulsations and no hepatojugular reflux; brisk carotid pulses without delay and no carotid bruits Chest: clear to auscultation, no signs of consolidation by percussion or palpation, normal fremitus, symmetrical and full respiratory excursions Cardiovascular: normal position and quality of the apical impulse, regular rhythm, normal first and second heart sounds, 2/6 aortic ejection murmur; no diastolic murmurs, rubs or gallops Abdomen: no tenderness or distention, no masses by palpation, no abnormal pulsatility or arterial bruits, normal bowel sounds, no hepatosplenomegaly Extremities: no clubbing, cyanosis or edema; 2+ radial, ulnar and brachial pulses bilaterally; 2+ right femoral, posterior tibial and dorsalis pedis pulses; 2+ left femoral, posterior tibial and dorsalis pedis pulses; no subclavian or femoral bruits Neurological: grossly nonfocal Psych: euthymic mood, full affect   EKG:  EKG is not ordered today.   Recent Labs: 01/20/2014: Magnesium 1.6 04/19/2014: ALT 17 05/01/2014: BUN 14; Creatinine 0.90; Hemoglobin 10.8*; Platelets 173; Potassium 3.3*; Sodium 136    Lipid Panel No results found  for: CHOL, TRIG, HDL, CHOLHDL, VLDL, LDLCALC, LDLDIRECT    Wt Readings from Last 3 Encounters:  06/14/14 138 lb 12.8 oz (62.959 kg)  05/12/14 144 lb (65.318 kg)  05/03/14 147 lb (66.679 kg)      Other studies Reviewed: Additional studies/ records that were reviewed today include: Dr. Antionette Char notes, echo images/report.  ASSESSMENT AND PLAN:  1.  AS s/p TAVR - generally excellent improvement in symptoms, normal prosthesis function.  2. Fatigue - will try to reduce the AM beta blocker  3. HTN - fair control  4. Mild CAD by cath (<30% left main, 30% proximal RCA, cath 12/2013) - risk factor modification  5. GERD - OK to switch back to omeprazole.   Current medicines are reviewed at length with the patient today.  The patient has concerns regarding medicines., which she thinks may be causing her early morning fatigue. The following changes have been made:  Reduce carvedilol AM dose (only) to 6.25 mg  Labs/ tests ordered today include:  No orders of the defined types were placed in this encounter.   Patient Instructions  DECREASE Carvedilol to 1/2 tablet in the AM and 1 tablet in the PM.  Dr. Sallyanne Kuster recommends that you schedule a follow-up appointment in: 6 Months.      Mikael Spray, MD  06/16/2014 12:35 PM    Sanda Klein, MD, [facility] [phone] office [phone] pager

## 2014-06-14 NOTE — Patient Instructions (Signed)
DECREASE Carvedilol to 1/2 tablet in the AM and 1 tablet in the PM.  Dr. Sallyanne Kuster recommends that you schedule a follow-up appointment in: 6 Months.

## 2014-06-16 ENCOUNTER — Encounter: Payer: Self-pay | Admitting: Cardiovascular Disease

## 2014-06-29 ENCOUNTER — Telehealth: Payer: Self-pay | Admitting: Cardiovascular Disease

## 2014-06-29 NOTE — Telephone Encounter (Signed)
[name] is calling because she wants to know if its okay to take ciprosloxacn(for baldder infection) and the other Zocor ( for her elevated Cholestrol) . Please call    Thanks

## 2014-06-29 NOTE — Telephone Encounter (Signed)
Spoke with pt, aware they can be taken together

## 2014-09-22 ENCOUNTER — Encounter: Payer: Self-pay | Admitting: Gastroenterology

## 2014-12-14 ENCOUNTER — Telehealth: Payer: Self-pay | Admitting: Cardiovascular Disease

## 2014-12-14 NOTE — Telephone Encounter (Signed)
Request was received today and I have placed this in the paperwork basket to forward to Dr Croitoru's office at [employer]. Dr Sallyanne Kuster is the pt's primary cardiologist. I made [name] aware of this information. The pt has a pending appointment with Dr Sallyanne Kuster on 12/16/14.

## 2014-12-14 NOTE — Telephone Encounter (Signed)
New message      Calling to see if we received a clearance for a back injection from Dr Unice Bailey office?

## 2014-12-15 ENCOUNTER — Encounter: Payer: Self-pay | Admitting: Cardiovascular Disease

## 2014-12-16 ENCOUNTER — Encounter: Payer: Self-pay | Admitting: Cardiovascular Disease

## 2014-12-16 ENCOUNTER — Ambulatory Visit (INDEPENDENT_AMBULATORY_CARE_PROVIDER_SITE_OTHER): Payer: Medicare Other | Admitting: Cardiovascular Disease

## 2014-12-16 VITALS — BP 172/70 | HR 95 | Resp 16 | Ht 62.0 in | Wt 138.0 lb

## 2014-12-16 DIAGNOSIS — E785 Hyperlipidemia, unspecified: Secondary | ICD-10-CM

## 2014-12-16 DIAGNOSIS — I350 Nonrheumatic aortic (valve) stenosis: Secondary | ICD-10-CM | POA: Diagnosis not present

## 2014-12-16 DIAGNOSIS — Z954 Presence of other heart-valve replacement: Secondary | ICD-10-CM

## 2014-12-16 DIAGNOSIS — I10 Essential (primary) hypertension: Secondary | ICD-10-CM | POA: Diagnosis not present

## 2014-12-16 DIAGNOSIS — I5032 Chronic diastolic (congestive) heart failure: Secondary | ICD-10-CM

## 2014-12-16 DIAGNOSIS — Z952 Presence of prosthetic heart valve: Secondary | ICD-10-CM

## 2014-12-16 DIAGNOSIS — Z01818 Encounter for other preprocedural examination: Secondary | ICD-10-CM

## 2014-12-16 NOTE — Progress Notes (Signed)
Patient ID: [name], female   DOB: [birth year], 81 y.o.   MRN: [id number]     Cardiology Office Note   Date:  12/16/2014   ID:  [name], DOB [birth year], MRN [id number]  PCP:  Ky Barban, MD  Cardiologist:   Sanda Klein, MD   Chief Complaint  Patient presents with  . 6 MONTHS    Patient has no complaints.      History of Present Illness: [name] is a 81 y.o. female who presents for  Preoperative evaluation before undergoing spinal injections for severe lumbar spine related pain. She describes her back pain  As disabling and is the worst thing she's gone through, much more severe than any symptoms related to her aortic stenosis or knee surgery. She denies exertional angina or dyspnea and has not expressed palpitations , syncope, edema, focal neurological deficits or claudication. She walks with a cane now. She continues to live independently and drives.  Fatigue improved after we reduced her carvedilol dose.   she underwent TAVR with a Sapien3, 23 mm  Stent valve on 01/19/2014. She does not have perivalvular leak and has mildly elevated gradients. Left ventricular systolic function is normal.  Past Medical History  Diagnosis Date  . Hypertension   . Hyperlipidemia   . GERD (gastroesophageal reflux disease) 05/14/2011  . Arthritis     Both knees R>L  . Chronic diastolic congestive heart failure, NYHA class 2 ([place])   . Severe aortic stenosis 12/06/2013  . Heart murmur   . Shortness of breath dyspnea   . Anxiety   . 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 Cholecystectomy  2008  . Tonsillectomy      70 years ago  . Refractive surgery Bilateral   . Cataract extraction Bilateral   . Knee arthroscopy Bilateral   . Transcatheter aortic valve replacement, transfemoral N/A 01/19/2014   Procedure: TRANSCATHETER AORTIC VALVE REPLACEMENT, TRANSFEMORAL;  Surgeon: Sherren Mocha, MD;  Location: [place];  Service: Open Heart Surgery;  Laterality: N/A;  . Intraoperative transesophageal echocardiogram N/A 01/19/2014    Procedure: INTRAOPERATIVE TRANSESOPHAGEAL ECHOCARDIOGRAM;  Surgeon: Sherren Mocha, MD;  Location: [facility] OR;  Service: Open Heart Surgery;  Laterality: N/A;  . Left and right heart catheterization with coronary angiogram N/A 12/08/2013    Procedure: LEFT AND RIGHT HEART CATHETERIZATION WITH CORONARY ANGIOGRAM;  Surgeon: Sanda Klein, MD;  Location: [place] CATH LAB;  Service: Cardiovascular;  Laterality: N/A;  . Eye surgery    . Total knee arthroplasty Right 04/28/2014    Procedure: TOTAL KNEE ARTHROPLASTY;  Surgeon: Kathryne Hitch, MD;  Location: [place];  Service: Orthopedics;  Laterality: Right;     Current Outpatient Prescriptions  Medication Sig Dispense Refill  . amLODipine (NORVASC) 10 MG tablet Take 10 mg by mouth daily.    . carvedilol (COREG) 12.5 MG tablet Take 12.5 mg by mouth 2 (two) times daily. Take 1/2 tablet in the AM and 1 tablet in the PM.    . cholecalciferol (VITAMIN D) 1000 UNITS tablet Take 1,000 Units by mouth 2 (two) times daily.     . clopidogrel (PLAVIX) 75 MG tablet Take 1 tablet (75 mg total) by mouth daily with breakfast. 30 tablet 10  . fenofibrate micronized (LOFIBRA) 200 MG capsule Take 200 mg by mouth daily.     Marland Kitchen  fluticasone (FLONASE) 50 MCG/ACT nasal spray Place 1 spray into both nostrils 2 (two) times daily as needed (congestion).    . hydrochlorothiazide (HYDRODIURIL) 25 MG tablet Take 12.5 mg by mouth daily.    Marland Kitchen omeprazole (PRILOSEC) 40 MG capsule Take 1 capsule (40 mg total) by mouth daily.     No current facility-administered medications for this visit.    Allergies:   Aspirin and Codeine    Social History:  The patient  reports that she has never smoked. She has never used smokeless tobacco. She reports that she does not drink alcohol  or use illicit drugs.   Family History:  [name] includes Breast cancer in her sister; Muscular dystrophy in her father. There is no history of Colon cancer.    ROS:  Please see the history of present illness.    Otherwise, review of systems positive for none.   All other systems are reviewed and negative.    PHYSICAL EXAM: VS:  BP 172/70 mmHg  Pulse 95  Resp 16  Ht 5\' 2"  (1.575 m)  Wt 138 lb (62.596 kg)  BMI 25.23 kg/m2 , BMI Body mass index is 25.23 kg/(m^2).  General: Alert, oriented x3, no distress Head: no evidence of trauma, PERRL, EOMI, no exophtalmos or lid lag, no myxedema, no xanthelasma; normal ears, nose and oropharynx Neck: normal jugular venous pulsations and no hepatojugular reflux; brisk carotid pulses without delay and no carotid bruits Chest: clear to auscultation, no signs of consolidation by percussion or palpation, normal fremitus, symmetrical and full respiratory excursions Cardiovascular: normal position and quality of the apical impulse, regular rhythm, normal first and second heart sounds, 2/6  Early peaking systolic ejection murmur in the aortic focus,no  diastolic murmurs, rubs or gallops Abdomen: no tenderness or distention, no masses by palpation, no abnormal pulsatility or arterial bruits, normal bowel sounds, no hepatosplenomegaly Extremities: no clubbing, cyanosis or edema; 2+ radial, ulnar and brachial pulses bilaterally; 2+ right femoral, posterior tibial and dorsalis pedis pulses; 2+ left femoral, posterior tibial and dorsalis pedis pulses; no subclavian or femoral bruits Neurological: grossly nonfocal Psych: euthymic mood, full affect   EKG:  EKG is ordered today. The ekg ordered today demonstrates  Normal sinus rhythm with a single PAC   Recent Labs: 01/20/2014: Magnesium 1.6 04/19/2014: ALT 17 05/01/2014: BUN 14; Creatinine, Ser 0.90; Hemoglobin 10.8*; Platelets 173; Potassium 3.3*; Sodium 136    Lipid Panel No results  found for: CHOL, TRIG, HDL, CHOLHDL, VLDL, LDLCALC, LDLDIRECT    Wt Readings from Last 3 Encounters:  12/16/14 138 lb (62.596 kg)  06/14/14 138 lb 12.8 oz (62.959 kg)  05/12/14 144 lb (65.318 kg)     .   ASSESSMENT AND PLAN:  1. AS s/p TAVR -  excellent improvement in symptoms since TAVR, normal prosthesis function.  Okay to temporarily discontinue clopidogrel prior to lumbar spine injection  2.  HTN -  Previously undergo control;  Relatively high blood pressure and heart rate today can be achieved to her back pain.  After she goes through her lumbar spine injection procedure, if her pain resolves,  Have asked her to call us back with some blood pressure recordings.  3. Mild CAD by cath (<30% left main, 30% proximal RCA, cath 12/2013) - risk factor modification     Current medicines are reviewed at length with the patient today.  The patient does not have concerns regarding medicines.  The following changes have been made:  no change  Labs/  tests ordered today include:  Orders Placed This Encounter  Procedures  . EKG 12-Lead    Patient Instructions  Dr. Sallyanne Kuster recommends that you schedule a follow-up appointment in: June 2017  CALL IF YOUR SYSTOLIC BLOOD PRESSURE IS GREATER THAN 150.       Mikael Spray, MD  12/16/2014 5:28 PM    Sanda Klein, MD, [facility] [phone] office [phone] pager

## 2014-12-16 NOTE — Patient Instructions (Signed)
Dr. Sallyanne Kuster recommends that you schedule a follow-up appointment in: June 2017  CALL IF YOUR SYSTOLIC BLOOD PRESSURE IS GREATER THAN 150.

## 2014-12-23 ENCOUNTER — Telehealth (HOSPITAL_COMMUNITY): Payer: Self-pay

## 2014-12-23 NOTE — Telephone Encounter (Signed)
Called pt's daughter [name] to schedule consult with Dr. Estanislado Pandy and she stated that they have already seen Dr. Ace Gins. [name] stated that Dr. Ace Gins said that the pain was not coming from a compression fracture but stenosis. [name] says that her mom has since received a shot and is doing much better. They did not want to come in and see Dr. Estanislado Pandy. AW

## 2015-01-03 ENCOUNTER — Encounter: Payer: Self-pay | Admitting: Physician Assistant

## 2015-01-03 ENCOUNTER — Ambulatory Visit (INDEPENDENT_AMBULATORY_CARE_PROVIDER_SITE_OTHER): Payer: Medicare Other | Admitting: Physician Assistant

## 2015-01-03 VITALS — BP 150/78 | HR 99 | Ht 62.0 in | Wt 133.0 lb

## 2015-01-03 DIAGNOSIS — Z953 Presence of xenogenic heart valve: Secondary | ICD-10-CM

## 2015-01-03 DIAGNOSIS — I5032 Chronic diastolic (congestive) heart failure: Secondary | ICD-10-CM | POA: Diagnosis not present

## 2015-01-03 DIAGNOSIS — I350 Nonrheumatic aortic (valve) stenosis: Secondary | ICD-10-CM | POA: Diagnosis not present

## 2015-01-03 DIAGNOSIS — Z954 Presence of other heart-valve replacement: Secondary | ICD-10-CM | POA: Diagnosis not present

## 2015-01-03 DIAGNOSIS — I10 Essential (primary) hypertension: Secondary | ICD-10-CM

## 2015-01-03 NOTE — Progress Notes (Signed)
Cardiology Office Note   Date:  01/03/2015   ID:  [name], DOB April 24, 1925, MRN [id number]  PCP:  Ky Barban, MD  Cardiologist: Dr. Sallyanne Kuster TAVR: Dr. Burt Knack Chief Complaint: back pain    History of Present Illness: [name] is a 81 y.o. female who presents for an year follow-up after undergoing TAVR for NAS. She also has hypertension and mild CAD by cath in 12/2013 with a less than 30% left main, 30% RCA.   Patient is doing well from a cardiac standpoint. She denies any chest pain, palpitations, dyspnea, dyspnea on exertion, dizziness or presyncope. Having significant back pain and recently had an injection that didn't help. She is told she has spinal stenosis. She goes back to the orthopedist on Thursday. She is very uncomfortable. She continues to live alone but gets assistance from her daughter and sister.    Past Medical History  Diagnosis Date  . Hypertension   . Hyperlipidemia   . GERD (gastroesophageal reflux disease) 05/14/2011  . Arthritis     Both knees R>L  . Chronic diastolic congestive heart failure, NYHA class 2 ([place])   . Severe aortic stenosis 12/06/2013  . Heart murmur   . Shortness of breath dyspnea   . Anxiety   . 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 Cholecystectomy  2008  . Tonsillectomy      70 years ago  . Refractive surgery Bilateral   . Cataract extraction Bilateral   . Knee arthroscopy Bilateral   . Transcatheter aortic valve replacement, transfemoral N/A 01/19/2014    Procedure: TRANSCATHETER AORTIC VALVE REPLACEMENT, TRANSFEMORAL;  Surgeon: Sherren Mocha, MD;  Location: [place];  Service: Open Heart Surgery;  Laterality: N/A;  . Intraoperative transesophageal echocardiogram N/A 01/19/2014    Procedure: INTRAOPERATIVE TRANSESOPHAGEAL ECHOCARDIOGRAM;  Surgeon:  Sherren Mocha, MD;  Location: [facility] OR;  Service: Open Heart Surgery;  Laterality: N/A;  . Left and right heart catheterization with coronary angiogram N/A 12/08/2013    Procedure: LEFT AND RIGHT HEART CATHETERIZATION WITH CORONARY ANGIOGRAM;  Surgeon: Sanda Klein, MD;  Location: [place] CATH LAB;  Service: Cardiovascular;  Laterality: N/A;  . Eye surgery    . Total knee arthroplasty Right 04/28/2014    Procedure: TOTAL KNEE ARTHROPLASTY;  Surgeon: Kathryne Hitch, MD;  Location: [place];  Service: Orthopedics;  Laterality: Right;     Current Outpatient Prescriptions  Medication Sig Dispense Refill  . amLODipine (NORVASC) 10 MG tablet Take 10 mg by mouth daily.    . carvedilol (COREG) 12.5 MG tablet Take 12.5 mg by mouth 2 (two) times daily. Take 1/2 tablet in the AM and 1 tablet in the PM.    . cholecalciferol (VITAMIN D) 1000 UNITS tablet Take 1,000 Units by mouth 2 (two) times daily.     . clopidogrel (PLAVIX) 75 MG tablet Take 1 tablet (75 mg total) by mouth daily with breakfast. 30 tablet 10  . fluticasone (FLONASE) 50 MCG/ACT nasal spray Place 1 spray into both nostrils 2 (two) times daily as needed (congestion).    . hydrochlorothiazide (HYDRODIURIL) 25 MG tablet Take 12.5 mg by mouth daily.    Marland Kitchen omeprazole (PRILOSEC) 40 MG capsule Take 1 capsule (40 mg total) by mouth daily.     No current facility-administered medications for this visit.    Allergies:   Aspirin  and Codeine    Social History:  The patient  reports that she has never smoked. She has never used smokeless tobacco. She reports that she does not drink alcohol or use illicit drugs.   Family History:  The patient's    family history includes Breast cancer in her sister; Muscular dystrophy in her father. There is no history of Colon cancer, Heart attack, Stroke, or Hypertension.    ROS:  Please see the history of present illness.   Otherwise, review of systems are positive for hearing loss and back pain.   All other systems are  reviewed and negative.    PHYSICAL EXAM: VS:  Ht 5\' 2"  (1.575 m)  Wt 133 lb (60.328 kg)  BMI 24.32 kg/m2 , BMI Body mass index is 24.32 kg/(m^2). GEN: Well nourished, well developed, in no acute distress Neck: no JVD, HJR, carotid bruits, or masses Cardiac: RRR; no murmurs,gallop, rubs, thrill or heave,  Respiratory:  clear to auscultation bilaterally, normal work of breathing GI: soft, nontender, nondistended, + BS MS: no deformity or atrophy Extremities: without cyanosis, clubbing, edema, good distal pulses bilaterally.  Skin: warm and dry, no rash Neuro:  Strength and sensation are intact    EKG:  EKG is not ordered today. EKG reviewed from 12/16/14 and shows normal sinus rhythm with PACs   Recent Labs: 01/20/2014: Magnesium 1.6 04/19/2014: ALT 17 05/01/2014: BUN 14; Creatinine, Ser 0.90; Hemoglobin 10.8*; Platelets 173; Potassium 3.3*; Sodium 136    Lipid Panel No results found for: CHOL, TRIG, HDL, CHOLHDL, VLDL, LDLCALC, LDLDIRECT    Wt Readings from Last 3 Encounters:  01/03/15 133 lb (60.328 kg)  12/16/14 138 lb (62.596 kg)  06/14/14 138 lb 12.8 oz (62.959 kg)      Other studies Reviewed: Additional studies/ records that were reviewed today 2-D echo 03/16/14 Study Conclusions  - Left ventricle: The cavity size was normal. There was mild   concentric hypertrophy with moderate basal septal hypertrophy.   Systolic function was vigorous. The estimated ejection fraction   was in the range of 65% to 70%. Wall motion was normal; there   were no regional wall motion abnormalities. Doppler parameters   are consistent with abnormal left ventricular relaxation (grade 1   diastolic dysfunction). Doppler parameters are consistent with   high ventricular filling pressure. - Aortic valve: TAVR: Sapien -45mm placed on 01/19/14. Bioprosthetic   valve. There was no perivalvular leak. Valve is functioning   normally. Peak velocity (S): 265 cm/s. - Mitral valve: Severely  calcified annulus. Mildly thickened   leaflets . Leaflet separation was reduced. The findings are   consistent with mild stenosis. Mean gradient (D): 7 mm Hg. - Left atrium: The atrium was moderately dilated.  Impressions:  - Compared to the prior study, there has been no significant   interval change.    ASSESSMENT AND PLAN: Severe aortic valve stenosis s/p TAVR Patient is almost one year out status post TAVR. She is doing quite well from a cardiac standpoint. I discussed this patient with Dr. Burt Knack who says she can stop her Plavix. She needs to be scheduled for her 2-D echo as part of the TAVR program. Follow up with Dr. Sallyanne Kuster in 6 months.  Essential hypertension Patient's blood pressure is up a little today. She is having significant back pain. I will not increase her medications at this time.  Chronic diastolic congestive heart failure, NYHA class 2 No evidence of heart failure on exam.     Signed, Selinda Eon  Vita Barley  01/03/2015 12:51 PM    [facility] [address], [place], [place]  [address] Phone: [phone]; Fax: [phone]

## 2015-01-03 NOTE — Assessment & Plan Note (Signed)
Patient is almost one year out status post TAVR. She is doing quite well from a cardiac standpoint. I discussed this patient with Dr. Burt Knack who says she can stop her Plavix. She needs to be scheduled for her 2-D echo as part of the TAVR program. Follow up with Dr. Sallyanne Kuster in 6 months.

## 2015-01-03 NOTE — Assessment & Plan Note (Signed)
No evidence of heart failure on exam 

## 2015-01-03 NOTE — Patient Instructions (Signed)
**Note De-Identified Rohith Fauth Obfuscation** Medication Instructions:  Stop taking Plavix-all other medications remain the same.  Labwork: None  Testing/Procedures: Your physician has requested that you have an echocardiogram. Echocardiography is a painless test that uses sound waves to create images of your heart. It provides your doctor with information about the size and shape of your heart and how well your heart's chambers and valves are working. This procedure takes approximately one hour. There are no restrictions for this procedure. Must be done soon.  Follow-Up: Your physician recommends that you schedule a follow-up appointment in: as already planned      If you need a refill on your cardiac medications before your next appointment, please call your pharmacy.

## 2015-01-03 NOTE — Assessment & Plan Note (Signed)
Patient's blood pressure is up a little today. She is having significant back pain. I will not increase her medications at this time.

## 2015-01-06 ENCOUNTER — Ambulatory Visit (HOSPITAL_COMMUNITY): Payer: Medicare Other | Attending: Cardiovascular Disease

## 2015-01-06 ENCOUNTER — Other Ambulatory Visit: Payer: Self-pay

## 2015-01-06 DIAGNOSIS — I517 Cardiomegaly: Secondary | ICD-10-CM | POA: Insufficient documentation

## 2015-01-06 DIAGNOSIS — I340 Nonrheumatic mitral (valve) insufficiency: Secondary | ICD-10-CM | POA: Diagnosis not present

## 2015-01-06 DIAGNOSIS — Z954 Presence of other heart-valve replacement: Secondary | ICD-10-CM | POA: Insufficient documentation

## 2015-01-06 DIAGNOSIS — I071 Rheumatic tricuspid insufficiency: Secondary | ICD-10-CM | POA: Insufficient documentation

## 2015-01-06 DIAGNOSIS — I10 Essential (primary) hypertension: Secondary | ICD-10-CM | POA: Insufficient documentation

## 2015-01-06 DIAGNOSIS — Z953 Presence of xenogenic heart valve: Secondary | ICD-10-CM

## 2015-04-04 ENCOUNTER — Telehealth: Payer: Self-pay | Admitting: Cardiovascular Disease

## 2015-04-04 NOTE — Telephone Encounter (Signed)
She needs the patient diagnosis.

## 2015-04-05 NOTE — Telephone Encounter (Signed)
Left message on Dawn's confidential voicemail that she needs to send a release of information to our office to obtain further information. Pt's primary cardiologist is Dr Sallyanne Kuster.

## 2015-05-11 IMAGING — CT CT HEART MORP W/ CTA COR W/ SCORE W/ CA W/CM &/OR W/O CM
1 of 17 series · 1 of 20 positions shown, 2 images · non-contrast
Comparison: None.

CLINICAL DATA: Aortic Stenosis

EXAM:
Cardiac TAVR CT
TECHNIQUE: The patient was scanned on a Philips 256 scanner. A 120 kV
retrospective scan was triggered in the descending thoracic aorta at
111 HU's. Gantry rotation speed was 270 msecs and collimation was .9
mm. No beta blockade or nitro were given. The 3D data set was
reconstructed in 5% intervals of the R-R cycle. Systolic and
diastolic phases were analyzed on a dedicated work station using
MPR, MIP and VRT modes. The patient received 80 cc of contrast.

[Series 300: locator · axial · 0.41mm/px · z∈[-128,-128]mm · 1 of 1 slices shown, 2 images]
[im 1/1  vessel]
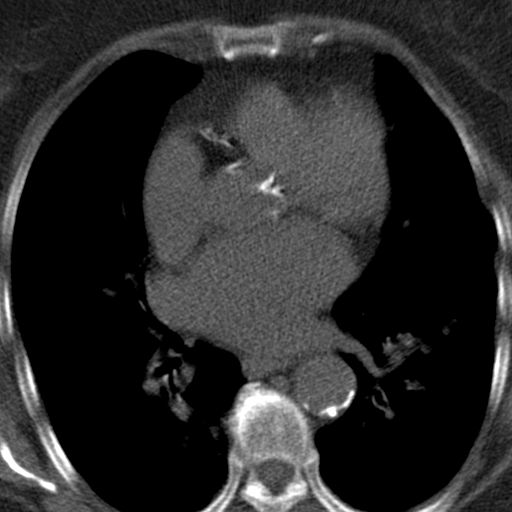
[im 1/1  lung]
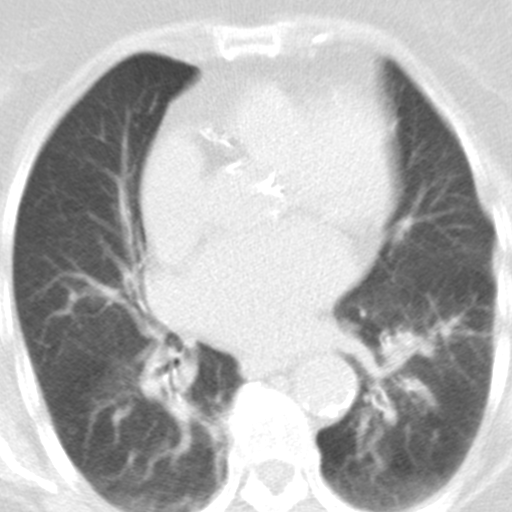

[1 of 20 positions shown; findings below may reference images not displayed]

FINDINGS: Aortic Valve:  Trileaflet with severe calcification of leaflet tips

Aorta: STJ a bit diminutive but little calcium. Mild calcification
of inferior surface of Arch. Normal origin of great vessels. Mild
mixed plaque in the descending thoracic aorta

Sinotubular Junction:  2.2 cm

Ascending Thoracic Aorta:  2.9 cm

Aortic Arch:  2.3 cm

Descending Thoracic Aorta:  2.2 cm

Sinus of Valsalva Measurements:

Non-coronary:  2.6 cm

Right -coronary:  2.5 cm

Left -coronary:  2.7 cm

Coronary Artery Height above Annulus:

Left Main:  14.6 cm

Right Coronary:  15.7 cm

Virtual Basal Annulus Measurements:

Maximum/Minimum Diameter:  2.3 cm x 1.9 cm

Perimeter:  69 mm

Area:  366 mm2

Coronary Arteries: Right dominant. Calcified including LM ostium
High anterior take off to RCA. No obstructive disease see cath
report

Optimum Fluoroscopic Angle for Delivery: RAO 8 degrees Cranial 1
degrees
IMPRESSION: 1) Calcified trileaflet Aortic valve

2) Somewhat diminutive Aortic root with STJ 2.2 cm but no severe
calcification of STJ or annulus

3) Annulus Area 366 mm2 suitable for 23 mm Sapien XT valve

4) No adverse aortic root pathology for transfemoral delivery

5) Non obstructive CAD with calcification of LM ostium and high
anterior take off or RCA origin Coronary heights sufficient for TAVR

6) Optimum angiographic angle for delivery RAO 8 degrees Cranial 1
degree

Qomandan Tiger

EXAM:
OVER-READ INTERPRETATION  CT CHEST

The following report is an over-read performed by radiologist Dr.
over-read does not include interpretation of cardiac or coronary
anatomy or pathology. The coronary calcium score/coronary CTA
interpretation by the cardiologist is attached.
FINDINGS: No pathologically enlarged mediastinal, hilar or axillary lymph
nodes. Lower esophageal wall may be minimally thickened, raising
suspicion for gastroesophageal reflux disease. Mild scattered
pulmonary parenchymal scarring. No pleural fluid. Airway is
unremarkable. Incidental imaging of the upper abdomen shows no acute
findings. No worrisome lytic or sclerotic lesions. Degenerative
changes are seen in the spine.
IMPRESSION: No acute extracardiac findings.

## 2015-06-03 IMAGING — CR DG CHEST 2V
2 series · 2 of 2 positions shown · non-contrast
Comparison: Chest radiograph 06/25/2007

CLINICAL DATA: Transcatheter aortic valve replacement.

EXAM:
CHEST  2 VIEW

[w chest pa]
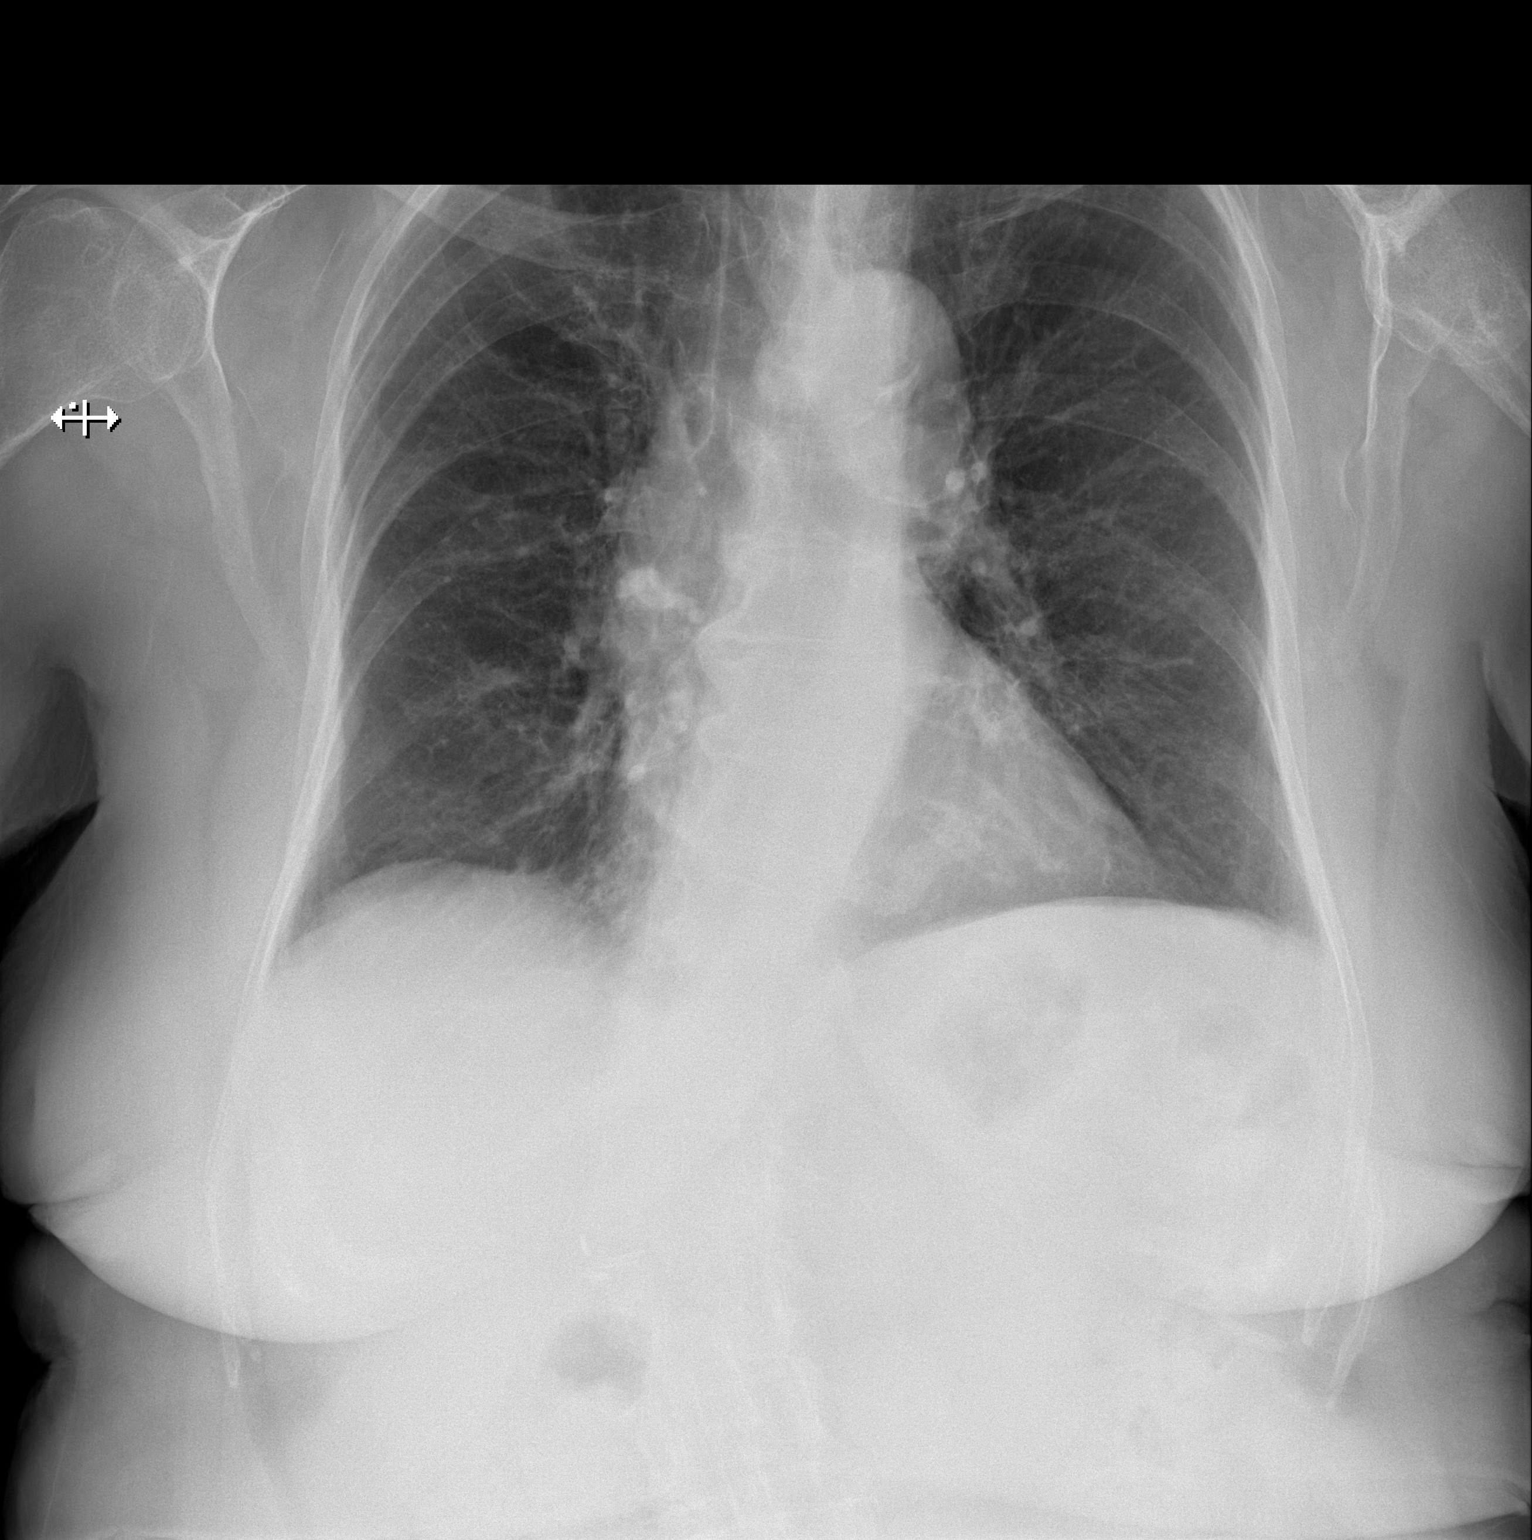

[w chest lat]
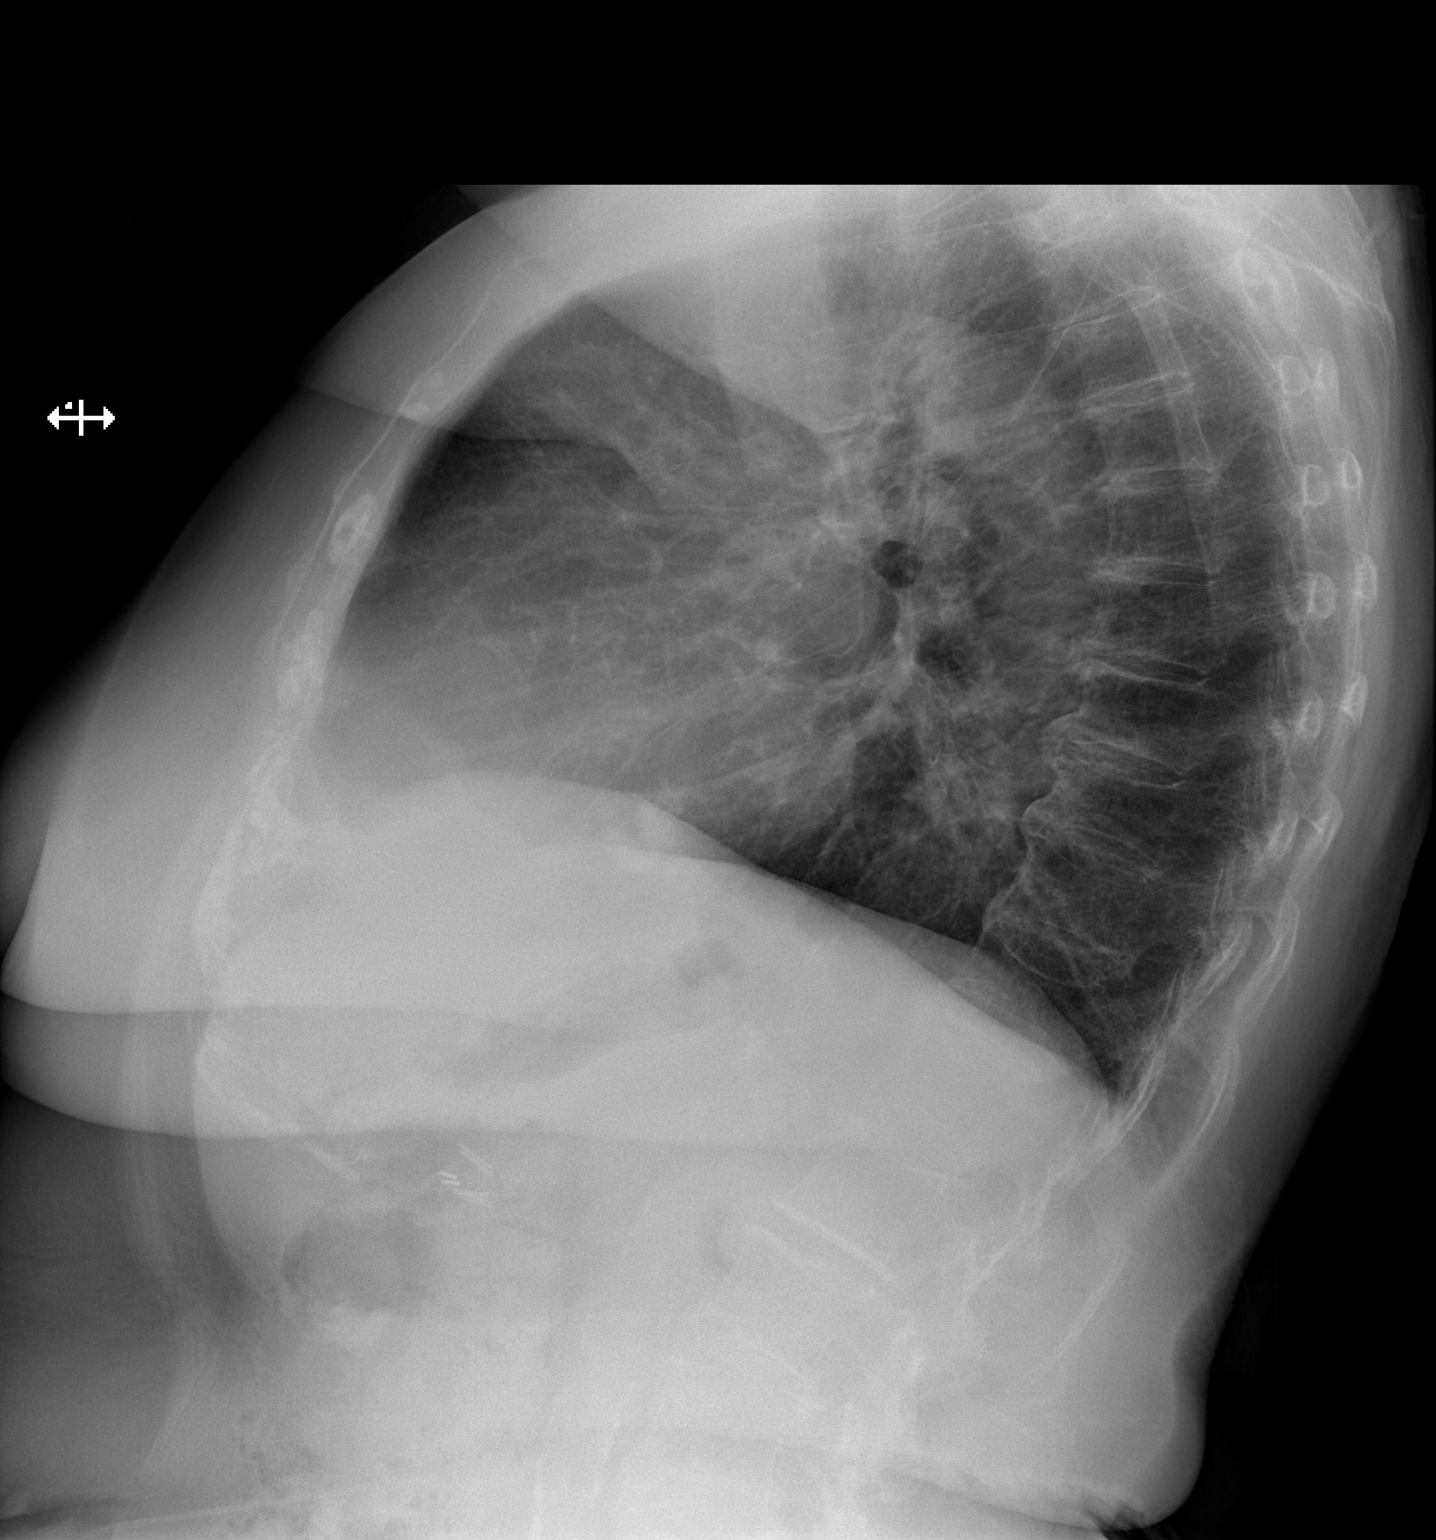

[2 of 2 positions shown; findings below may reference images not displayed]

FINDINGS: The heart size is normal. Thoracic aorta contour is normal. There is
atherosclerotic calcification of the thoracic aortic arch. Probable
calcified lymph node right hilum. Mitral annulus calcifications
visible. Pulmonary vascularity normal. Lungs are well expanded and
clear. Negative for pleural effusion or pneumothorax.

The bones appear osteopenic. Vertebral bodies are normal in height
and alignment and demonstrate typical degenerative change. There is
a mild convex right scoliosis of the lower thoracic/upper lumbar
spine. Atherosclerotic calcification of the abdominal aorta is
visualized. Cholecystectomy clips noted.
IMPRESSION: No acute cardiopulmonary disease.

Mitral anulus calcifications.

Thoracoabdominal aortic atherosclerosis.

## 2015-06-16 ENCOUNTER — Other Ambulatory Visit: Payer: Self-pay | Admitting: Physical Medicine and Rehabilitation

## 2015-06-16 DIAGNOSIS — R102 Pelvic and perineal pain: Secondary | ICD-10-CM

## 2015-06-23 ENCOUNTER — Other Ambulatory Visit: Payer: Medicare Other

## 2015-06-25 ENCOUNTER — Other Ambulatory Visit: Payer: Medicare Other

## 2015-06-25 ENCOUNTER — Ambulatory Visit
Admission: RE | Admit: 2015-06-25 | Discharge: 2015-06-25 | Disposition: A | Discharge status: Home / Self Care | Payer: Medicare Other | Source: Ambulatory Visit | Attending: Physical Medicine and Rehabilitation | Admitting: Physical Medicine and Rehabilitation

## 2015-06-25 DIAGNOSIS — R102 Pelvic and perineal pain: Secondary | ICD-10-CM

## 2015-08-26 ENCOUNTER — Encounter: Payer: Self-pay | Admitting: Cardiovascular Disease

## 2015-08-26 ENCOUNTER — Ambulatory Visit (INDEPENDENT_AMBULATORY_CARE_PROVIDER_SITE_OTHER): Payer: Medicare Other | Admitting: Cardiovascular Disease

## 2015-08-26 VITALS — BP 142/76 | HR 80 | Ht 64.0 in | Wt 143.2 lb

## 2015-08-26 DIAGNOSIS — M545 Low back pain, unspecified: Secondary | ICD-10-CM

## 2015-08-26 DIAGNOSIS — I5032 Chronic diastolic (congestive) heart failure: Secondary | ICD-10-CM | POA: Diagnosis not present

## 2015-08-26 DIAGNOSIS — I350 Nonrheumatic aortic (valve) stenosis: Secondary | ICD-10-CM | POA: Diagnosis not present

## 2015-08-26 DIAGNOSIS — I10 Essential (primary) hypertension: Secondary | ICD-10-CM

## 2015-08-26 DIAGNOSIS — I2510 Atherosclerotic heart disease of native coronary artery without angina pectoris: Secondary | ICD-10-CM

## 2015-08-26 MED ORDER — GABAPENTIN 100 MG PO CAPS: 100.0000 mg | ORAL_CAPSULE | Freq: Three times a day (TID) | ORAL | Status: DC

## 2015-08-26 NOTE — Progress Notes (Signed)
Cardiology Office Note    Date:  08/30/2015   ID:  [name], DOB August 26, 1925, MRN [id number]  PCP:  Sheri Barban, MD  Cardiologist:   Sheri Klein, MD   Chief Complaint  Patient presents with  . Follow-up    History of Present Illness:  [name] is a 82 y.o. female with a history of severe aortic stenosis treated in December 2015 with TAVR (Sapien3, 23 mm), but without other serious cardiac problems except hypertension. She had mild problems with diastolic heart failure that seem to have resolved completely following valve replacement. Cardiac catheterization in 2015 showed mild plaque in the left main coronary artery and proximal right coronary artery without significant obstruction.   Returning for follow-up today. She has no cardiac complaints and specifically denies shortness of breath, chest pain, palpitations, leg edema, claudication, focal neurological deficits, syncope or dizziness. All her complaints center around severe pain in her left lower back that had abrupt onset 1 day and has persisted now for many weeks. It is generally absent when she rests but occurs immediately with walking or even standing. She has seen 5 different physicians, has had x-rays and MRI, has had 7 spinal injections, has seen a chiropractor and has had acupuncture. Nothing has helped. She appears very frustrated. Continues to live independently and drives.    Past Medical History  Diagnosis Date  . Hypertension   . Hyperlipidemia   . GERD (gastroesophageal reflux disease) 05/14/2011  . Arthritis     Both knees R>L  . Chronic diastolic congestive heart failure, NYHA class 2 ([place])   . Severe aortic stenosis 12/06/2013  . Heart murmur   . Shortness of breath dyspnea   . Anxiety   . S/P TAVR (transcatheter aortic valve replacement) 01/19/2014    23 mm Edwards Sapien 3 transcatheter heart valve placed via open right transfemoral approach    Past Surgical History  Procedure Laterality  Date  . Cholecystectomy  2008  . Tonsillectomy      70 years ago  . Refractive surgery Bilateral   . Cataract extraction Bilateral   . Knee arthroscopy Bilateral   . Transcatheter aortic valve replacement, transfemoral N/A 01/19/2014    Procedure: TRANSCATHETER AORTIC VALVE REPLACEMENT, TRANSFEMORAL;  Surgeon: Sheri Mocha, MD;  Location: [facility];  Service: Open Heart Surgery;  Laterality: N/A;  . Intraoperative transesophageal echocardiogram N/A 01/19/2014    Procedure: INTRAOPERATIVE TRANSESOPHAGEAL ECHOCARDIOGRAM;  Surgeon: Sheri Mocha, MD;  Location: [facility] OR;  Service: Open Heart Surgery;  Laterality: N/A;  . Left and right heart catheterization with coronary angiogram N/A 12/08/2013    Procedure: LEFT AND RIGHT HEART CATHETERIZATION WITH CORONARY ANGIOGRAM;  Surgeon: Sheri Klein, MD;  Location: [facility] CATH [facility];  Service: Cardiovascular;  Laterality: N/A;  . Eye surgery    . Total knee arthroplasty Right 04/28/2014    Procedure: TOTAL KNEE ARTHROPLASTY;  Surgeon: Kathryne Hitch, MD;  Location: [facility];  Service: Orthopedics;  Laterality: Right;    Current Medications: Outpatient Prescriptions Prior to Visit  Medication Sig Dispense Refill  . amLODipine (NORVASC) 10 MG tablet Take 10 mg by mouth daily.    . carvedilol (COREG) 12.5 MG tablet Take 12.5 mg by mouth 2 (two) times daily. Take 1/2 tablet in the AM and 1 tablet in the PM.    . cholecalciferol (VITAMIN D) 1000 UNITS tablet Take 1,000 Units by mouth 2 (two) times daily.     . fluticasone (FLONASE) 50 MCG/ACT nasal spray Place 1  spray into both nostrils 2 (two) times daily as needed (congestion).    . hydrochlorothiazide (HYDRODIURIL) 25 MG tablet Take 12.5 mg by mouth daily.    Marland Kitchen omeprazole (PRILOSEC) 40 MG capsule Take 1 capsule (40 mg total) by mouth daily.     No facility-administered medications prior to visit.     Allergies:   Aspirin; Codeine; and Phenytoin   Social History   Social History  . Marital Status: Widowed     Spouse Name: N/A  . Number of Children: 3  . Years of Education: N/A   Social History Main Topics  . Smoking status: Never Smoker   . Smokeless tobacco: Never Used  . Alcohol Use: No  . Drug Use: No  . Sexual Activity: Not Asked   Other Topics Concern  . None   Social History Narrative   Widowed x 25 yrs,  Retired from [employer], lives alone - functionally independent     Family History:  [name] includes Breast cancer in her sister; Muscular dystrophy in her father. There is no history of Colon cancer, Heart attack, Stroke, or Hypertension.   ROS:   Please see the history of present illness.    ROS All other systems reviewed and are negative.   PHYSICAL EXAM:   VS:  BP 142/76 mmHg  Pulse 80  Ht 5\' 4"  (1.626 m)  Wt 64.955 kg (143 lb 3.2 oz)  BMI 24.57 kg/m2   GEN: Well nourished, well developed, in no acute distress HEENT: normal Neck: no JVD, carotid bruits, or masses Cardiac: Normal first and second heart sound, RRR; 2/6 early peaking systolic ejection murmur in the aortic focus, no diastolic murmurs, rubs, or gallops,no edema  Respiratory:  clear to auscultation bilaterally, normal work of breathing GI: soft, nontender, nondistended, + BS MS: no deformity or atrophy Skin: warm and dry, no rash Neuro:  Alert and Oriented x 3, Strength and sensation are intact Psych: euthymic mood, full affect  Wt Readings from Last 3 Encounters:  08/26/15 64.955 kg (143 lb 3.2 oz)  01/03/15 60.328 kg (133 lb)  12/16/14 62.596 kg (138 lb)      Studies/Labs Reviewed:   EKG:  EKG is not ordered today. Normal sinus rhythm, normal tracing, QTC 422 ms  ASSESSMENT:    1. Severe aortic valve stenosis s/p TAVR   2. Chronic diastolic congestive heart failure, NYHA class 2 ([name])   3. Essential hypertension   4. Atherosclerosis of native coronary artery of native heart without angina pectoris   5. Left-sided low back pain without sciatica      PLAN:    In order of problems listed above:  1. AS s/p TAVR: Review echo in November. Normal prosthetic function by physical exam. 2. Chronic diastolic heart failure: Apparent resolution following her valve replacement 3. HTN: Appears to be optimally controlled 4. CAD: Minor by previous angiography, without angina pectoris. Focus on risk factor modification. Continue statin 5. Chronic back pain. I told her that this is not my specialty but that it may be worthwhile trying a course of gabapentin which she can further titrate with her primary care provider or pain specialist.    Medication Adjustments/Labs and Tests Ordered: Current medicines are reviewed at length with the patient today.  Concerns regarding medicines are outlined above.  Medication changes, Labs and Tests ordered today are listed in the Patient Instructions below. Patient Instructions  Medication Instructions: Dr Sallyanne Kuster has recommended making the following medication changes: 1.  START Gabapentin 100 mg - take 1 capsule by mouth twice daily  Labwork: NONE ORDERED  Testing/Procedures: 1. Echocardiogram in early November - Your physician has requested that you have an echocardiogram. Echocardiography is a painless test that uses sound waves to create images of your heart. It provides your doctor with information about the size and shape of your heart and how well your heart's chambers and valves are working. This procedure takes approximately one hour. There are no restrictions for this procedure. This will be done at our [facility] location - [address], [address].  Follow-up: Your physician recommends that you schedule a follow-up appointment in late November with Dr Burt Knack.  Dr Sallyanne Kuster recommends that you schedule a follow-up appointment in MAY 2018. You will receive a reminder letter in the mail two months in advance. If you don't receive a letter, please call our office to schedule the follow-up appointment.  If you  need a refill on your cardiac medications before your next appointment, please call your pharmacy.     Signed, Sheri Klein, MD  08/30/2015 5:15 PM    [facility] [address], [place], [place]  [address] Phone: [phone]; Fax: [phone]

## 2015-08-26 NOTE — Patient Instructions (Addendum)
Medication Instructions: Dr Sallyanne Kuster has recommended making the following medication changes: 1. START Gabapentin 100 mg - take 1 capsule by mouth twice daily  Labwork: NONE ORDERED  Testing/Procedures: 1. Echocardiogram in early November - Your physician has requested that you have an echocardiogram. Echocardiography is a painless test that uses sound waves to create images of your heart. It provides your doctor with information about the size and shape of your heart and how well your heart's chambers and valves are working. This procedure takes approximately one hour. There are no restrictions for this procedure. This will be done at our [facility] location - [address].  Follow-up: Your physician recommends that you schedule a follow-up appointment in late November with Dr Burt Knack.  Dr Sallyanne Kuster recommends that you schedule a follow-up appointment in MAY 2018. You will receive a reminder letter in the mail two months in advance. If you don't receive a letter, please call our office to schedule the follow-up appointment.  If you need a refill on your cardiac medications before your next appointment, please call your pharmacy.

## 2015-08-30 DIAGNOSIS — M549 Dorsalgia, unspecified: Secondary | ICD-10-CM | POA: Insufficient documentation

## 2015-08-30 DIAGNOSIS — I2510 Atherosclerotic heart disease of native coronary artery without angina pectoris: Secondary | ICD-10-CM | POA: Insufficient documentation

## 2015-10-04 LAB — LIPID PANEL
CHOLESTEROL: 216 mg/dL — AB (ref 0–200)
HDL: 61 mg/dL (ref 35–70)
LDL Cholesterol: 136 mg/dL
Triglycerides: 93 mg/dL (ref 40–160)

## 2015-10-04 LAB — HEPATIC FUNCTION PANEL
ALK PHOS: 64 U/L (ref 25–125)
ALT: 11 U/L (ref 7–35)
AST: 13 U/L (ref 13–35)
Bilirubin, Total: 0.4 mg/dL

## 2015-10-04 LAB — BASIC METABOLIC PANEL
BUN: 23 mg/dL — AB (ref 4–21)
Creatinine: 1.3 mg/dL — AB (ref 0.5–1.1)
GLUCOSE: 88 mg/dL
POTASSIUM: 4.5 mmol/L (ref 3.4–5.3)
Sodium: 139 mmol/L (ref 137–147)

## 2015-10-31 HISTORY — PX: CARPAL TUNNEL RELEASE: SHX101

## 2015-11-08 ENCOUNTER — Encounter: Payer: Self-pay | Admitting: Nurse Practitioner

## 2015-11-08 ENCOUNTER — Ambulatory Visit (INDEPENDENT_AMBULATORY_CARE_PROVIDER_SITE_OTHER): Payer: Medicare Other | Admitting: Nurse Practitioner

## 2015-11-08 VITALS — BP 134/78 | HR 76 | Temp 98.0°F | Resp 17 | Ht 64.0 in | Wt 137.8 lb

## 2015-11-08 DIAGNOSIS — M545 Low back pain, unspecified: Secondary | ICD-10-CM

## 2015-11-08 DIAGNOSIS — R42 Dizziness and giddiness: Secondary | ICD-10-CM | POA: Diagnosis not present

## 2015-11-08 DIAGNOSIS — G4700 Insomnia, unspecified: Secondary | ICD-10-CM | POA: Diagnosis not present

## 2015-11-08 DIAGNOSIS — K219 Gastro-esophageal reflux disease without esophagitis: Secondary | ICD-10-CM

## 2015-11-08 DIAGNOSIS — F32A Depression, unspecified: Secondary | ICD-10-CM

## 2015-11-08 DIAGNOSIS — R269 Unspecified abnormalities of gait and mobility: Secondary | ICD-10-CM

## 2015-11-08 DIAGNOSIS — F329 Major depressive disorder, single episode, unspecified: Secondary | ICD-10-CM

## 2015-11-08 DIAGNOSIS — I5032 Chronic diastolic (congestive) heart failure: Secondary | ICD-10-CM | POA: Diagnosis not present

## 2015-11-08 DIAGNOSIS — E785 Hyperlipidemia, unspecified: Secondary | ICD-10-CM | POA: Diagnosis not present

## 2015-11-08 DIAGNOSIS — R634 Abnormal weight loss: Secondary | ICD-10-CM

## 2015-11-08 MED ORDER — MIRTAZAPINE 15 MG PO TABS: 15.0000 mg | ORAL_TABLET | Freq: Every day | ORAL | 1 refills | Status: DC

## 2015-11-08 NOTE — Patient Instructions (Signed)
To start remeron 15 mg by mouth at night to increase appetite and help sleep  Physical therapy ordered to help with back pain, dizziness and evaluate for home safety

## 2015-11-08 NOTE — Progress Notes (Signed)
Careteam: Patient Care Team: Ky Barban, MD as PCP - General (Family Medicine) Milus Banister, MD as Attending Physician (Gastroenterology)  Advanced Directive information Does patient have an advance directive?: Yes, Type of Advance Directive: Living will (Paperwork given for POA 11/08/15)  Allergies  Allergen Reactions  . Aspirin Nausea And Vomiting  . Codeine Nausea And Vomiting  . Phenytoin Nausea And Vomiting    Chief Complaint  Patient presents with  . Medical Management of Chronic Issues    Establish as new patient. Pt is dizzy and nausous every morning   . Other    Daughter, [name], in room      HPI: Patient is a 82 y.o. female seen in the office today to establish care. Has seen multiple doctors regarding her back pain which has been chronic and one of the doctors recommended her to go to a  geriatric practice so she is transferring her care here.   Chronic low back pain- 7 doctors, multiple imagining and can not find anything wrong with her back, a multiple injections which do not help. Has also had a chiropractor and has had acupuncture.  AS s/p TAVR- following with Dr Thana Farr  Had flu shot 2 weeks.  Feels like she has had her pneumonia vaccines  Lives alone. Here with daughter today who is very concerned over her. She sits alone and does nothing during the day. Denies depression but daughter reports she is very depressed.   Does not cook for herself. Does not eat or drink unless she is propted by family   Decrease in appetite. Just not hungry  Review of Systems:  Review of Systems  Constitutional: Positive for appetite change and unexpected weight change. Negative for activity change and fatigue.  HENT: Positive for hearing loss (wears hearing aids). Negative for tinnitus.   Eyes:       Has early stages macular degeneration   Respiratory: Negative for cough and shortness of breath.   Cardiovascular: Negative for chest pain, palpitations and leg  swelling.  Gastrointestinal: Positive for constipation and nausea. Negative for diarrhea and vomiting.  Genitourinary: Positive for frequency. Negative for difficulty urinating and dysuria.  Musculoskeletal: Positive for back pain. Negative for arthralgias and myalgias.  Skin: Negative for rash and wound.  Neurological: Positive for dizziness.       Dizziness with nausea in the morning and then she is find in the afternoons.   Psychiatric/Behavioral: Positive for confusion (memory comes and goes) and dysphoric mood.    Past Medical History:  Diagnosis Date  . Anxiety   . Arthritis    Both knees R>L  . Chronic diastolic congestive heart failure, NYHA class 2 ([place])   . GERD (gastroesophageal reflux disease) 05/14/2011  . Heart murmur   . Hyperlipidemia   . Hypertension   . 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 Severe aortic stenosis 12/06/2013  . Shortness of breath dyspnea    Past Surgical History:  Procedure Laterality Date  . CARPAL TUNNEL RELEASE Right 10/31/2015  . CATARACT EXTRACTION Bilateral   . CHOLECYSTECTOMY  2008  . EYE SURGERY    . INTRAOPERATIVE TRANSESOPHAGEAL ECHOCARDIOGRAM N/A 01/19/2014   Procedure: INTRAOPERATIVE TRANSESOPHAGEAL ECHOCARDIOGRAM;  Surgeon: Sherren Mocha, MD;  Location: [facility] OR;  Service: Open Heart Surgery;  Laterality: N/A;  . KNEE ARTHROSCOPY Bilateral   . LEFT AND RIGHT HEART CATHETERIZATION WITH CORONARY ANGIOGRAM  N/A 12/08/2013   Procedure: LEFT AND RIGHT HEART CATHETERIZATION WITH CORONARY ANGIOGRAM;  Surgeon: Sanda Klein, MD;  Location: [facility] CATH LAB;  Service: Cardiovascular;  Laterality: N/A;  . REFRACTIVE SURGERY Bilateral   . TONSILLECTOMY     70 years ago  . TOTAL KNEE ARTHROPLASTY Right 04/28/2014   Procedure: TOTAL KNEE ARTHROPLASTY;  Surgeon: Kathryne Hitch, MD;  Location: [facility];  Service: Orthopedics;  Laterality:  Right;  . TRANSCATHETER AORTIC VALVE REPLACEMENT, TRANSFEMORAL N/A 01/19/2014   Procedure: TRANSCATHETER AORTIC VALVE REPLACEMENT, TRANSFEMORAL;  Surgeon: Sherren Mocha, MD;  Location: [facility];  Service: Open Heart Surgery;  Laterality: N/A;   Social History:   reports that she has never smoked. She has never used smokeless tobacco. She reports that she does not drink alcohol or use drugs.  Family History  Problem Relation Age of Onset  . Muscular dystrophy Father     Died at 38  . Stroke Mother   . Ovarian cancer Sister   . Breast cancer Sister   . Heart attack Sister     Died at 47  . Colon cancer Neg Hx   . Hypertension Neg Hx     Medications: Patient's Medications  New Prescriptions   MIRTAZAPINE (REMERON) 15 MG TABLET    Take 1 tablet (15 mg total) by mouth at bedtime.  Previous Medications   AMLODIPINE (NORVASC) 10 MG TABLET    Take 10 mg by mouth daily.   ASCORBIC ACID (VITAMIN C) 1000 MG TABLET    Take 1,000 mg by mouth 2 (two) times daily.    CARVEDILOL (COREG) 12.5 MG TABLET    Take 12.5 mg by mouth 2 (two) times daily. Take 1/2 tablet in the AM and 1 tablet in the PM.   CHOLECALCIFEROL (VITAMIN D) 1000 UNITS TABLET    Take 1,000 Units by mouth 2 (two) times daily.    FENOFIBRATE 160 MG TABLET    Take 160 mg by mouth daily.   FLUTICASONE (FLONASE) 50 MCG/ACT NASAL SPRAY    Place 1 spray into both nostrils 2 (two) times daily as needed (congestion).   HYDROCHLOROTHIAZIDE (HYDRODIURIL) 25 MG TABLET    Take 25 mg by mouth daily.   MULTIPLE VITAMINS-MINERALS (ICAPS AREDS 2) CAPS    Take 2 capsules by mouth daily.   OMEPRAZOLE (PRILOSEC) 40 MG CAPSULE    Take 1 capsule (40 mg total) by mouth daily.   ONDANSETRON (ZOFRAN) 4 MG TABLET    Take 4 mg by mouth daily as needed for nausea or vomiting.  Modified Medications   No medications on file  Discontinued Medications   GABAPENTIN (NEURONTIN) 100 MG CAPSULE    Take 1 capsule (100 mg total) by mouth 3 (three) times daily.    HYDROCHLOROTHIAZIDE (HYDRODIURIL) 25 MG TABLET    Take 12.5 mg by mouth daily.     Physical Exam:  Vitals:   11/08/15 0903  BP: 134/78  Pulse: 76  Resp: 17  Temp: 98 F (36.7 C)  TempSrc: Oral  SpO2: 95%  Weight: 137 lb 12.8 oz (62.5 kg)  Height: '5\' 4"'  (1.626 m)   Body mass index is 23.65 kg/m.  Physical Exam  Constitutional: She is oriented to person, place, and time. She appears well-developed and well-nourished. No distress.  HENT:  Head: Normocephalic and atraumatic.  Mouth/Throat: Oropharynx is clear and moist. No oropharyngeal exudate.  Eyes: Conjunctivae are normal. Pupils are equal, round, and reactive to light.  Neck: Normal range of motion. Neck supple.  Cardiovascular: Normal rate and regular rhythm.   Murmur heard. Pulmonary/Chest: Effort normal and breath sounds normal.  Abdominal: Soft. Bowel sounds are normal.  Musculoskeletal: She exhibits no edema or tenderness.  Neurological: She is alert and oriented to person, place, and time.  Skin: Skin is warm and dry. She is not diaphoretic.  Psychiatric: She has a normal mood and affect.    Labs reviewed: Basic Metabolic Panel: No results for input(s): NA, K, CL, CO2, GLUCOSE, BUN, CREATININE, CALCIUM, MG, PHOS, TSH in the last 8760 hours. Liver Function Tests: No results for input(s): AST, ALT, ALKPHOS, BILITOT, PROT, ALBUMIN in the last 8760 hours. No results for input(s): LIPASE, AMYLASE in the last 8760 hours. No results for input(s): AMMONIA in the last 8760 hours. CBC: No results for input(s): WBC, NEUTROABS, HGB, HCT, MCV, PLT in the last 8760 hours. Lipid Panel: No results for input(s): CHOL, HDL, LDLCALC, TRIG, CHOLHDL, LDLDIRECT in the last 8760 hours. TSH: No results for input(s): TSH in the last 8760 hours. A1C: Lab Results  Component Value Date   HGBA1C 5.4 01/15/2014     Assessment/Plan  1. Loss of weight With decrease appetite. Daughter reports she does not eat unless reminded. Pt  reports no appetite. Has lost 6 lbs per epic review since July.  Encouraged to liberalize diet. Ensure as supplement not meal replacement To make meal times more social. Getting more family involved.  Having meals that are easily made To avoid foods with no nutritional value.  - mirtazapine (REMERON) 15 MG tablet; Take 1 tablet (15 mg total) by mouth at bedtime.  Dispense: 30 tablet; Refill: 1 - CMP with eGFR; Future - CBC with Differential/Platelets; Future - TSH; Future  2. Dizziness Ongoing dizziness,  Has been going on for several months. Reports she may have episode once a week or daily, not related to position that she is aware of.  - Ambulatory referral to [facility] for evaluation, may need additional workup if persist.   3. Abnormality of gait -bumping into things around the home, using her cane when she should be using her walker.  - Ambulatory referral to [facility] for gait training and home safety assessment   4. Depression Pt states she is not depressed but gets tearful when talking about her pain, dizziness and nausea. States she does nothing for fun because everything has been taken away from her due to her medication conditions. Possible memory loss as well, will get MMSE at next visit. Remeron has been added for appetite and insomnia but will hopefully benefit mood as well.   5. Chronic diastolic congestive heart failure, NYHA class 2 (HCC) Euvolemic, conts on coreg BID  6. Gastroesophageal reflux disease, esophagitis presence not specified Controlled on omeprazole   7. Hyperlipidemia -currently on fenofibrate, will follow up labs prior to next visit  - Lipid panel; Future  8. Midline low back pain without sciatica -extensive work up without relief from pain, reports doctors are unable to find source of pain from imaging, chiropractor and acupuncture are not effective - Ambulatory referral to [facility]  9. Insomnia - mirtazapine (REMERON) 15 MG tablet; Take 1  tablet (15 mg total) by mouth at bedtime.  Dispense: 30 tablet; Refill: 1  Total time 60 mins: time greater than 50% of total time spent doing pt counseled and coordination/plan of care regarding multiple medical diagnosis and issues  Follow up in 1 months for EV with MMSE and labs prior to visit.  Carlos American. Dewaine Oats,  [facility] [phone] 8 am - 5 pm) [phone] (after hours)

## 2015-11-11 DIAGNOSIS — R634 Abnormal weight loss: Secondary | ICD-10-CM | POA: Diagnosis not present

## 2015-11-11 DIAGNOSIS — M545 Low back pain: Secondary | ICD-10-CM | POA: Diagnosis not present

## 2015-11-11 DIAGNOSIS — R42 Dizziness and giddiness: Secondary | ICD-10-CM | POA: Diagnosis not present

## 2015-11-11 DIAGNOSIS — M170 Bilateral primary osteoarthritis of knee: Secondary | ICD-10-CM | POA: Diagnosis not present

## 2015-11-11 DIAGNOSIS — F329 Major depressive disorder, single episode, unspecified: Secondary | ICD-10-CM | POA: Diagnosis not present

## 2015-11-11 DIAGNOSIS — I110 Hypertensive heart disease with heart failure: Secondary | ICD-10-CM | POA: Diagnosis not present

## 2015-11-14 ENCOUNTER — Telehealth: Payer: Self-pay

## 2015-11-14 NOTE — Telephone Encounter (Signed)
Gracee with [facility] called requesting verbal orders for Physical Therapy 1 x weekly x 1 week and 2 x weekly x 6 weeks.  Per [employer] standing order, verbal order given. Message will be sent to patient's provider as a FYI.

## 2015-12-02 ENCOUNTER — Other Ambulatory Visit: Payer: Medicare Other

## 2015-12-02 ENCOUNTER — Other Ambulatory Visit: Payer: Self-pay | Admitting: *Deleted

## 2015-12-02 DIAGNOSIS — E785 Hyperlipidemia, unspecified: Secondary | ICD-10-CM

## 2015-12-02 DIAGNOSIS — R634 Abnormal weight loss: Secondary | ICD-10-CM

## 2015-12-02 LAB — LIPID PANEL
CHOL/HDL RATIO: 3.6 ratio (ref <=5.0)
CHOLESTEROL: 222 mg/dL — AB (ref 125–200)
HDL: 61 mg/dL (ref >=46)
LDL Cholesterol: 136 mg/dL — ABNORMAL HIGH (ref <130)
Triglycerides: 126 mg/dL (ref <150)
VLDL: 25 mg/dL (ref <30)

## 2015-12-02 LAB — COMPLETE METABOLIC PANEL WITH GFR
ALBUMIN: 3.8 g/dL (ref 3.6–5.1)
ALK PHOS: 76 U/L (ref 33–130)
ALT: 12 U/L (ref 6–29)
AST: 15 U/L (ref 10–35)
BUN: 21 mg/dL (ref 7–25)
CO2: 25 mmol/L (ref 20–31)
CREATININE: 1.25 mg/dL — AB (ref 0.60–0.88)
Calcium: 9.9 mg/dL (ref 8.6–10.4)
Chloride: 101 mmol/L (ref 98–110)
GFR, Est African American: 44 mL/min — ABNORMAL LOW (ref >=60)
GFR, Est Non African American: 38 mL/min — ABNORMAL LOW (ref >=60)
Glucose, Bld: 91 mg/dL (ref 65–99)
Potassium: 4.2 mmol/L (ref 3.5–5.3)
Sodium: 137 mmol/L (ref 135–146)
Total Bilirubin: 0.4 mg/dL (ref 0.2–1.2)
Total Protein: 7.2 g/dL (ref 6.1–8.1)

## 2015-12-02 LAB — CBC WITH DIFFERENTIAL/PLATELET
BASOS ABS: 0 {cells}/uL (ref 0–200)
Basophils Relative: 0 %
EOS PCT: 3 %
Eosinophils Absolute: 183 cells/uL (ref 15–500)
HCT: 35.7 % (ref 35.0–45.0)
HEMOGLOBIN: 11.7 g/dL (ref 11.7–15.5)
LYMPHS ABS: 1708 {cells}/uL (ref 850–3900)
LYMPHS PCT: 28 %
MCH: 28.7 pg (ref 27.0–33.0)
MCHC: 32.8 g/dL (ref 32.0–36.0)
MCV: 87.7 fL (ref 80.0–100.0)
MPV: 10.5 fL (ref 7.5–12.5)
Monocytes Absolute: 732 cells/uL (ref 200–950)
Monocytes Relative: 12 %
NEUTROS PCT: 57 %
Neutro Abs: 3477 cells/uL (ref 1500–7800)
Platelets: 385 10*3/uL (ref 140–400)
RBC: 4.07 MIL/uL (ref 3.80–5.10)
RDW: 13.9 % (ref 11.0–15.0)
WBC: 6.1 10*3/uL (ref 3.8–10.8)

## 2015-12-02 LAB — TSH: TSH: 2.93 mIU/L

## 2015-12-02 MED ORDER — CARVEDILOL 12.5 MG PO TABS: 12.5000 mg | ORAL_TABLET | Freq: Two times a day (BID) | ORAL | 0 refills | Status: DC

## 2015-12-06 ENCOUNTER — Encounter: Payer: Self-pay | Admitting: Nurse Practitioner

## 2015-12-06 ENCOUNTER — Ambulatory Visit (INDEPENDENT_AMBULATORY_CARE_PROVIDER_SITE_OTHER): Payer: Medicare Other

## 2015-12-06 ENCOUNTER — Ambulatory Visit (INDEPENDENT_AMBULATORY_CARE_PROVIDER_SITE_OTHER): Payer: Medicare Other | Admitting: Nurse Practitioner

## 2015-12-06 VITALS — BP 150/72 | HR 70 | Temp 97.6°F | Ht 64.0 in | Wt 141.0 lb

## 2015-12-06 DIAGNOSIS — E785 Hyperlipidemia, unspecified: Secondary | ICD-10-CM

## 2015-12-06 DIAGNOSIS — I10 Essential (primary) hypertension: Secondary | ICD-10-CM

## 2015-12-06 DIAGNOSIS — R269 Unspecified abnormalities of gait and mobility: Secondary | ICD-10-CM

## 2015-12-06 DIAGNOSIS — Z0000 Encounter for general adult medical examination without abnormal findings: Secondary | ICD-10-CM | POA: Diagnosis not present

## 2015-12-06 DIAGNOSIS — E2839 Other primary ovarian failure: Secondary | ICD-10-CM

## 2015-12-06 DIAGNOSIS — R42 Dizziness and giddiness: Secondary | ICD-10-CM

## 2015-12-06 DIAGNOSIS — Z23 Encounter for immunization: Secondary | ICD-10-CM

## 2015-12-06 DIAGNOSIS — F329 Major depressive disorder, single episode, unspecified: Secondary | ICD-10-CM

## 2015-12-06 DIAGNOSIS — F32A Depression, unspecified: Secondary | ICD-10-CM

## 2015-12-06 DIAGNOSIS — R634 Abnormal weight loss: Secondary | ICD-10-CM | POA: Diagnosis not present

## 2015-12-06 MED ORDER — MIRTAZAPINE 15 MG PO TABS: 15.0000 mg | ORAL_TABLET | Freq: Every day | ORAL | 4 refills | Status: DC

## 2015-12-06 NOTE — Progress Notes (Signed)
Subjective:   [name] is a 82 y.o. female who presents for an Initial Medicare Annual Wellness Visit.  Review of Systems      Cardiac Risk Factors include: advanced age (>62men, >43 women)     Objective:    Today's Vitals   12/06/15 1000 12/06/15 1001  BP: (!) 150/72   Pulse: 70   Temp: 97.6 F (36.4 C)   TempSrc: Oral   SpO2: 96%   Weight: 141 lb (64 kg)   Height: 5\' 4"  (1.626 m)   PainSc: 0-No pain 0-No pain   Body mass index is 24.2 kg/m.   Current Medications (verified) Outpatient Encounter Prescriptions as of 12/06/2015  Medication Sig  . amLODipine (NORVASC) 10 MG tablet Take 10 mg by mouth daily.  . Ascorbic Acid (VITAMIN C) 1000 MG tablet Take 1,000 mg by mouth 2 (two) times daily.   . carvedilol (COREG) 12.5 MG tablet Take 1 tablet (12.5 mg total) by mouth 2 (two) times daily.  . cholecalciferol (VITAMIN D) 1000 UNITS tablet Take 1,000 Units by mouth 2 (two) times daily.   . fenofibrate 160 MG tablet Take 160 mg by mouth daily.  . fluticasone (FLONASE) 50 MCG/ACT nasal spray Place 1 spray into both nostrils 2 (two) times daily as needed (congestion).  . hydrochlorothiazide (HYDRODIURIL) 25 MG tablet Take 25 mg by mouth daily.  . mirtazapine (REMERON) 15 MG tablet Take 1 tablet (15 mg total) by mouth at bedtime.  . Multiple Vitamins-Minerals (ICAPS AREDS 2) CAPS Take 2 capsules by mouth daily.  Marland Kitchen omeprazole (PRILOSEC) 40 MG capsule Take 1 capsule (40 mg total) by mouth daily.  . ondansetron (ZOFRAN) 4 MG tablet Take 4 mg by mouth daily as needed for nausea or vomiting.   No facility-administered encounter medications on file as of 12/06/2015.     Allergies (verified) Aspirin; Codeine; and Phenytoin   History: Past Medical History:  Diagnosis Date  . Anxiety   . Arthritis    Both knees R>L  . Chronic diastolic congestive heart failure, NYHA class 2 ([place])   . GERD (gastroesophageal reflux disease) 05/14/2011  . Heart murmur   . Hyperlipidemia     . Hypertension   . Lumbago   . 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 Severe aortic stenosis 12/06/2013  . Shortness of breath dyspnea    Past Surgical History:  Procedure Laterality Date  . CARPAL TUNNEL RELEASE Right 10/31/2015  . CATARACT EXTRACTION Bilateral   . CHOLECYSTECTOMY  2008  . EYE SURGERY    . INTRAOPERATIVE TRANSESOPHAGEAL ECHOCARDIOGRAM N/A 01/19/2014   Procedure: INTRAOPERATIVE TRANSESOPHAGEAL ECHOCARDIOGRAM;  Surgeon: Sherren Mocha, MD;  Location: [facility] OR;  Service: Open Heart Surgery;  Laterality: N/A;  . KNEE ARTHROSCOPY Bilateral   . LEFT AND RIGHT HEART CATHETERIZATION WITH CORONARY ANGIOGRAM N/A 12/08/2013   Procedure: LEFT AND RIGHT HEART CATHETERIZATION WITH CORONARY ANGIOGRAM;  Surgeon: Sanda Klein, MD;  Location: [facility] CATH LAB;  Service: Cardiovascular;  Laterality: N/A;  . REFRACTIVE SURGERY Bilateral   . REPLACEMENT TOTAL KNEE Right   . TONSILLECTOMY     70 years ago  . TOTAL KNEE ARTHROPLASTY Right 04/28/2014   Procedure: TOTAL KNEE ARTHROPLASTY;  Surgeon: Kathryne Hitch, MD;  Location: [place];  Service: Orthopedics;  Laterality: Right;  . TRANSCATHETER AORTIC VALVE REPLACEMENT, TRANSFEMORAL N/A 01/19/2014   Procedure: TRANSCATHETER AORTIC VALVE REPLACEMENT, TRANSFEMORAL;  Surgeon: Legrand Como  Burt Knack, MD;  Location: [facility];  Service: Open Heart Surgery;  Laterality: N/A;   Family History  Problem Relation Age of Onset  . Muscular dystrophy Father     Died at 78  . Stroke Mother   . Ovarian cancer Sister   . Breast cancer Sister   . Heart attack Sister     Died at 64  . Colon cancer Neg Hx   . Hypertension Neg Hx    Social History   Occupational History  . Not on file.   Social History Main Topics  . Smoking status: Never Smoker  . Smokeless tobacco: Never Used  . Alcohol use No  . Drug use: No  . Sexual activity: No    Tobacco  Counseling Counseling given: No   Activities of Daily Living In your present state of health, do you have any difficulty performing the following activities: 12/06/2015  Hearing? Y  Vision? N  Difficulty concentrating or making decisions? Y  Walking or climbing stairs? Y  Dressing or bathing? Y  Doing errands, shopping? Y  Preparing Food and eating ? N  Using the Toilet? N  In the past six months, have you accidently leaked urine? Y  Do you have problems with loss of bowel control? N  Managing your Medications? Y  Managing your Finances? Y  Housekeeping or managing your Housekeeping? Y  Some recent data might be hidden    Immunizations and Health Maintenance Immunization History  Administered Date(s) Administered  . Influenza-Unspecified 11/05/2013, 10/21/2014, 10/27/2015  . Pneumococcal Polysaccharide-23 11/01/2012, 12/06/2015   Health Maintenance Due  Topic Date Due  . TETANUS/TDAP  04/09/1944  . DEXA SCAN  04/09/1992    Patient Care Team: Lauree Chandler, NP as PCP - General (Geriatric Medicine) Milus Banister, MD as Attending Physician (Gastroenterology) Alphonsa Overall, MD as Consulting Physician (General Surgery) Sherren Mocha, MD as Consulting Physician (Cardiology)  Indicate any recent Medical Services you may have received from other than [facility] providers in the past year (date may be approximate).     Assessment:   This is a routine wellness examination for [name].  Hearing/Vision screen Hearing Screening Comments: Pt has bi-lateral hearing aides. Due for Hearing screen. Vision Screening Comments: Last Eye exam-11/30/15  Dietary issues and exercise activities discussed: Current Exercise Habits: Home exercise routine (Pt is currently working with PT due to back pain), Type of exercise: stretching;walking, Time (Minutes): 60, Frequency (Times/Week): 2, Weekly Exercise (Minutes/Week): 120, Intensity: Mild  Goals    . Exercise 3x per week (30 min per time)            Starting 12/06/15, I will attempt to continue doing the exercises from PT on a daily basis and to continue after PT is finished.       Depression Screen PHQ 2/9 Scores 12/06/2015 11/08/2015  PHQ - 2 Score 0 0    Fall Risk Fall Risk  12/06/2015 11/08/2015  Falls in the past year? No No    Cognitive Function: MMSE - Mini Mental State Exam 12/06/2015  Orientation to time 4  Orientation to Place 4  Registration 3  Attention/ Calculation 5  Recall 3  Language- name 2 objects 2  Language- repeat 1  Language- follow 3 step command 3  Language- read & follow direction 1  Write a sentence 1  Copy design 1  Total score 28    Screening Tests Health Maintenance  Topic Date Due  . TETANUS/TDAP  04/09/1944  . DEXA SCAN  04/09/1992  . INFLUENZA VACCINE  Completed  . ZOSTAVAX  Completed  . PNA vac Low Risk Adult  Completed      Plan:   I have personally reviewed and addressed the Medicare Annual Wellness questionnaire and have noted the following in the patient's chart:  A. Medical and social history B. Use of alcohol, tobacco or illicit drugs  C. Current medications and supplements D. Functional ability and status E.  Nutritional status F.  Physical activity G. Advance directives H. List of other physicians I.  Hospitalizations, surgeries, and ER visits in previous 12 months J.  [place] to include hearing, vision, cognitive, depression L. Referrals and appointments - none  In addition, I have reviewed and discussed with patient certain preventive protocols, quality metrics, and best practice recommendations. A written personalized care plan for preventive services as well as general preventive health recommendations were provided to patient.  See attached scanned questionnaire for additional information.   Signed,   Allyn Kenner, LPN Health Advisor

## 2015-12-06 NOTE — Progress Notes (Signed)
Provider: Lauree Chandler, NP  Patient Care Team: Lauree Chandler, NP as PCP - General (Geriatric Medicine) Milus Banister, MD as Attending Physician (Gastroenterology) Alphonsa Overall, MD as Consulting Physician (General Surgery) Sherren Mocha, MD as Consulting Physician (Cardiology)  Extended Emergency Contact Information Primary Emergency Contact: Johnson,[name] Address: [address]          [place], [place] [address] Johnnette Litter of [place] Phone: [phone] Relation: Daughter Secondary Emergency Contact: Hartley Barefoot" Address: [address]          [place],  [address] Johnnette Litter of [place] Phone: [phone] Mobile Phone: [phone] Relation: Son Allergies  Allergen Reactions  . Aspirin Nausea And Vomiting  . Codeine Nausea And Vomiting  . Phenytoin Nausea And Vomiting   Code Status: DNR Goals of Care: Advanced Directive information Advanced Directives 12/06/2015  Does patient have an advance directive? No  Type of Advance Directive -  Does patient want to make changes to advanced directive? -  Copy of advanced directive(s) in chart? No - copy requested     Chief Complaint  Patient presents with  . Medical Management of Chronic Issues    Complete physical. Passed clock drawing.    HPI: Patient is a 82 y.o. female seen in today for an annual wellness exam.   No Major illnesses or hospitalization in the last year Last visit started Remeron due to loss of weight, decrease in appetite and now she is eating great, has gained weight  Getting up a lot at night to urinate, still having trouble sleeping. Not all the time.   Mood has been much better, totally different person per daughter Walking around the block.  Now has glow.   Physical therapy has been working well. Back pain is better but still bothers her. Doing more activity.   Dizziness has improved, will still have some dizziness in the morning.     Depression screen  [facility] 2/9 12/06/2015 11/08/2015  Decreased Interest 0 0  Down, Depressed, Hopeless 0 0  PHQ - 2 Score 0 0    Fall Risk  12/06/2015 11/08/2015  Falls in the past year? No No   MMSE - Mini Mental State Exam 12/06/2015  Orientation to time 4  Orientation to Place 4  Registration 3  Attention/ Calculation 5  Recall 3  Language- name 2 objects 2  Language- repeat 1  Language- follow 3 step command 3  Language- read & follow direction 1  Write a sentence 1  Copy design 1  Total score 28     Health Maintenance  Topic Date Due  . TETANUS/TDAP  04/09/1944  . DEXA SCAN  04/09/1992  . INFLUENZA VACCINE  Completed  . ZOSTAVAX  Completed  . PNA vac Low Risk Adult  Completed    Past Medical History:  Diagnosis Date  . Anxiety   . Arthritis    Both knees R>L  . Chronic diastolic congestive heart failure, NYHA class 2 ([place])   . GERD (gastroesophageal reflux disease) 05/14/2011  . Heart murmur   . Hyperlipidemia   . Hypertension   . Lumbago   . 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 Severe aortic stenosis 12/06/2013  . Shortness of breath dyspnea     Past Surgical History:  Procedure Laterality Date  . CARPAL TUNNEL RELEASE Right 10/31/2015  . CATARACT EXTRACTION Bilateral   .  CHOLECYSTECTOMY  2008  . EYE SURGERY    . INTRAOPERATIVE TRANSESOPHAGEAL ECHOCARDIOGRAM N/A 01/19/2014   Procedure: INTRAOPERATIVE TRANSESOPHAGEAL ECHOCARDIOGRAM;  Surgeon: Sherren Mocha, MD;  Location: [facility] OR;  Service: Open Heart Surgery;  Laterality: N/A;  . KNEE ARTHROSCOPY Bilateral   . LEFT AND RIGHT HEART CATHETERIZATION WITH CORONARY ANGIOGRAM N/A 12/08/2013   Procedure: LEFT AND RIGHT HEART CATHETERIZATION WITH CORONARY ANGIOGRAM;  Surgeon: Sanda Klein, MD;  Location: [facility] CATH LAB;  Service: Cardiovascular;  Laterality: N/A;  . REFRACTIVE SURGERY Bilateral   . REPLACEMENT  TOTAL KNEE Right   . TONSILLECTOMY     70 years ago  . TOTAL KNEE ARTHROPLASTY Right 04/28/2014   Procedure: TOTAL KNEE ARTHROPLASTY;  Surgeon: Kathryne Hitch, MD;  Location: [facility];  Service: Orthopedics;  Laterality: Right;  . TRANSCATHETER AORTIC VALVE REPLACEMENT, TRANSFEMORAL N/A 01/19/2014   Procedure: TRANSCATHETER AORTIC VALVE REPLACEMENT, TRANSFEMORAL;  Surgeon: Sherren Mocha, MD;  Location: [facility];  Service: Open Heart Surgery;  Laterality: N/A;    Social History   Social History  . Marital status: Widowed    Spouse name: N/A  . Number of children: 3  . Years of education: N/A   Social History Main Topics  . Smoking status: Never Smoker  . Smokeless tobacco: Never Used  . Alcohol use No  . Drug use: No  . Sexual activity: No   Other Topics Concern  . None   Social History Narrative   Diet?       Do you drink/eat things with caffeine? No      Marital status?         widow                           What year were you married?      Do you live in a house, apartment, assisted living, condo, trailer, etc.?  Condo       Is it one or more stories? one      How many persons live in your home? Self      Do you have any pets in your home? (please list) no      Current or past profession: office      Do you exercise?                no                      Type & how often?      Do you have a living will? yes      Do you have a DNR form?  yes                                If not, do you want to discuss one?      Do you have signed POA/HPOA for forms? yes       Family History  Problem Relation Age of Onset  . Muscular dystrophy Father     Died at 53  . Stroke Mother   . Ovarian cancer Sister   . Breast cancer Sister   . Heart attack Sister     Died at 89  . Colon cancer Neg Hx   . Hypertension Neg Hx     Review of Systems:  Review of Systems  Constitutional: Positive for appetite change (increase in appetite) and unexpected weight change (weight  gain).  Negative for activity change and fatigue.  HENT: Positive for hearing loss (wears hearing aids). Negative for tinnitus.   Eyes:       Has early stages macular degeneration   Respiratory: Negative for cough and shortness of breath.   Cardiovascular: Negative for chest pain, palpitations and leg swelling.  Gastrointestinal: Positive for constipation and nausea. Negative for diarrhea and vomiting.  Genitourinary: Positive for frequency. Negative for difficulty urinating and dysuria.  Musculoskeletal: Positive for back pain. Negative for arthralgias and myalgias.  Skin: Negative for rash and wound.  Neurological: Positive for dizziness (has improved).       Dizziness with nausea in the morning and then she is find in the afternoons.   Psychiatric/Behavioral: Positive for confusion (memory comes and goes).      Medication List       Accurate as of 12/06/15 11:09 AM. Always use your most recent med list.          amLODipine 10 MG tablet Commonly known as:  NORVASC Take 10 mg by mouth daily.   carvedilol 12.5 MG tablet Commonly known as:  COREG Take 1 tablet (12.5 mg total) by mouth 2 (two) times daily.   cholecalciferol 1000 units tablet Commonly known as:  VITAMIN D Take 1,000 Units by mouth 2 (two) times daily.   fenofibrate 160 MG tablet Take 160 mg by mouth daily.   fluticasone 50 MCG/ACT nasal spray Commonly known as:  FLONASE Place 1 spray into both nostrils 2 (two) times daily as needed (congestion).   hydrochlorothiazide 25 MG tablet Commonly known as:  HYDRODIURIL Take 25 mg by mouth daily.   ICAPS AREDS 2 Caps Take 2 capsules by mouth daily.   mirtazapine 15 MG tablet Commonly known as:  REMERON Take 1 tablet (15 mg total) by mouth at bedtime.   omeprazole 40 MG capsule Commonly known as:  PRILOSEC Take 1 capsule (40 mg total) by mouth daily.   ondansetron 4 MG tablet Commonly known as:  ZOFRAN Take 4 mg by mouth daily as needed for nausea or  vomiting.   vitamin C 1000 MG tablet Take 1,000 mg by mouth 2 (two) times daily.         Physical Exam: Vitals:   12/06/15 1045  BP: (!) 150/72  Pulse: 70  Temp: 97.6 F (36.4 C)  SpO2: 96%  Weight: 141 lb (64 kg)  Height: 5\' 4"  (1.626 m)   Body mass index is 24.2 kg/m. Physical Exam  Labs reviewed: Basic Metabolic Panel:  Recent Labs  10/04/15 12/02/15 0802  NA 139 137  K 4.5 4.2  CL  --  101  CO2  --  25  GLUCOSE  --  91  BUN 23* 21  CREATININE 1.3* 1.25*  CALCIUM  --  9.9  TSH  --  2.93   Liver Function Tests:  Recent Labs  10/04/15 12/02/15 0802  AST 13 15  ALT 11 12  ALKPHOS 64 76  BILITOT  --  0.4  PROT  --  7.2  ALBUMIN  --  3.8   No results for input(s): LIPASE, AMYLASE in the last 8760 hours. No results for input(s): AMMONIA in the last 8760 hours. CBC:  Recent Labs  12/02/15 0802  WBC 6.1  NEUTROABS 3,477  HGB 11.7  HCT 35.7  MCV 87.7  PLT 385   Lipid Panel:  Recent Labs  10/04/15 12/02/15 0802  CHOL 216* 222*  HDL 61 61  LDLCALC 136 136*  TRIG 93 126  CHOLHDL  --  3.6   Lab Results  Component Value Date   HGBA1C 5.4 01/15/2014    Procedures: No results found.  Assessment/Plan 1. Loss of weight -appetite has improved, eating better and feeling better at this time. Will cont on Remeron, educated on food choices with good nutritional value.  - mirtazapine (REMERON) 15 MG tablet; Take 1 tablet (15 mg total) by mouth at bedtime.  Dispense: 30 tablet; Refill: 4  2. Estrogen deficiency - DG Bone Density; Future  3. Annual physical exam  pt has followed with LPN today, The patient was counseled regarding the appropriate use of alcohol, regular self-examination of the breasts on a monthly basis, prevention of dental and periodontal disease, diet, regular sustained exercise for at least 30 minutes 5 times per week  4. Dizziness Overall dizziness has improved  5. Abnormality of gait Improving with therapy.   6.  Depression, unspecified depression type Doing much better on remeron 15 mg daily, will cont at this time. conts on therapy which has gotten her moving and that has helped mood as well  7. Hyperlipidemia, unspecified hyperlipidemia type -discussed heart healthy diet and making appropriate food choices. LDL 136 at this time. Will cont to monitor.   8. Hypertension Improved at home. Will cont current medications, To bring blood pressure readings to next OV    Next appt: 3 months with Dr Mariea Clonts

## 2015-12-06 NOTE — Patient Instructions (Signed)
[name] , Thank you for taking time to come for your Medicare Wellness Visit. I appreciate your ongoing commitment to your health goals. Please review the following plan we discussed and let me know if I can assist you in the future.   These are the goals we discussed: Goals    None      This is a list of the screening recommended for you and due dates:  Health Maintenance  Topic Date Due  . Tetanus Vaccine  04/09/1944  . DEXA scan (bone density measurement)  04/09/1992  . Pneumonia vaccines (2 of 2 - PCV13) 11/01/2013  . Flu Shot  Completed  . Shingles Vaccine  Completed  Preventive Care for Adults  A healthy lifestyle and preventive care can promote health and wellness. Preventive health guidelines for adults include the following key practices.  . A routine yearly physical is a good way to check with your health care provider about your health and preventive screening. It is a chance to share any concerns and updates on your health and to receive a thorough exam.  . Visit your dentist for a routine exam and preventive care every 6 months. Brush your teeth twice a day and floss once a day. Good oral hygiene prevents tooth decay and gum disease.  . The frequency of eye exams is based on your age, health, family medical history, use  of contact lenses, and other factors. Follow your health care provider's ecommendations for frequency of eye exams.  . Eat a healthy diet. Foods like vegetables, fruits, whole grains, low-fat dairy products, and lean protein foods contain the nutrients you need without too many calories. Decrease your intake of foods high in solid fats, added sugars, and salt. Eat the right amount of calories for you. Get information about a proper diet from your health care provider, if necessary.  . Regular physical exercise is one of the most important things you can do for your health. Most adults should get at least 150 minutes of moderate-intensity exercise (any  activity that increases your heart rate and causes you to sweat) each week. In addition, most adults need muscle-strengthening exercises on 2 or more days a week.  Silver Sneakers may be a benefit available to you. To determine eligibility, you may visit the website: www.silversneakers.com or contact program at 760 455 4726 Mon-Fri between 8AM-8PM.   . Maintain a healthy weight. The body mass index (BMI) is a screening tool to identify possible weight problems. It provides an estimate of body fat based on height and weight. Your health care provider can find your BMI and can help you achieve or maintain a healthy weight.   For adults 20 years and older: ? A BMI below 18.5 is considered underweight. ? A BMI of 18.5 to 24.9 is normal. ? A BMI of 25 to 29.9 is considered overweight. ? A BMI of 30 and above is considered obese.   . Maintain normal blood lipids and cholesterol levels by exercising and minimizing your intake of saturated fat. Eat a balanced diet with plenty of fruit and vegetables. Blood tests for lipids and cholesterol should begin at age 82 and be repeated every 5 years. If your lipid or cholesterol levels are high, you are over 50, or you are at high risk for heart disease, you may need your cholesterol levels checked more frequently. Ongoing high lipid and cholesterol levels should be treated with medicines if diet and exercise are not working.  . If you smoke, find  out from your health care provider how to quit. If you do not use tobacco, please do not start.  . If you choose to drink alcohol, please do not consume more than 2 drinks per day. One drink is considered to be 12 ounces (355 mL) of beer, 5 ounces (148 mL) of wine, or 1.5 ounces (44 mL) of liquor.  . If you are 93-62 years old, ask your health care provider if you should take aspirin to prevent strokes.  . Use sunscreen. Apply sunscreen liberally and repeatedly throughout the day. You should seek shade when your  shadow is shorter than you. Protect yourself by wearing long sleeves, pants, a wide-brimmed hat, and sunglasses year round, whenever you are outdoors.  . Once a month, do a whole body skin exam, using a mirror to look at the skin on your back. Tell your health care provider of new moles, moles that have irregular borders, moles that are larger than a pencil eraser, or moles that have changed in shape or color.

## 2015-12-06 NOTE — Patient Instructions (Signed)
Recommend dentist every 6 months   DASH Eating Plan DASH stands for "Dietary Approaches to Stop Hypertension." The DASH eating plan is a healthy eating plan that has been shown to reduce high blood pressure (hypertension). Additional health benefits may include reducing the risk of type 2 diabetes mellitus, heart disease, and stroke. The DASH eating plan may also help with weight loss. WHAT DO I NEED TO KNOW ABOUT THE DASH EATING PLAN? For the DASH eating plan, you will follow these general guidelines:  Choose foods with a percent daily value for sodium of less than 5% (as listed on the food label).  Use salt-free seasonings or herbs instead of table salt or sea salt.  Check with your health care provider or pharmacist before using salt substitutes.  Eat lower-sodium products, often labeled as "lower sodium" or "no salt added."  Eat fresh foods.  Eat more vegetables, fruits, and low-fat dairy products.  Choose whole grains. Look for the word "whole" as the first word in the ingredient list.  Choose fish and skinless chicken or [place] more often than red meat. Limit fish, poultry, and meat to 6 oz (170 g) each day.  Limit sweets, desserts, sugars, and sugary drinks.  Choose heart-healthy fats.  Limit cheese to 1 oz (28 g) per day.  Eat more home-cooked food and less restaurant, buffet, and fast food.  Limit fried foods.  Cook foods using methods other than frying.  Limit canned vegetables. If you do use them, rinse them well to decrease the sodium.  When eating at a restaurant, ask that your food be prepared with less salt, or no salt if possible. WHAT FOODS CAN I EAT? Seek help from a dietitian for individual calorie needs. Grains Whole grain or whole wheat bread. Brown rice. Whole grain or whole wheat pasta. Quinoa, bulgur, and whole grain cereals. Low-sodium cereals. Corn or whole wheat flour tortillas. Whole grain cornbread. Whole grain crackers. Low-sodium  crackers. Vegetables Fresh or frozen vegetables (raw, steamed, roasted, or grilled). Low-sodium or reduced-sodium tomato and vegetable juices. Low-sodium or reduced-sodium tomato sauce and paste. Low-sodium or reduced-sodium canned vegetables.  Fruits All fresh, canned (in natural juice), or frozen fruits. Meat and Other Protein Products Ground beef (85% or leaner), grass-fed beef, or beef trimmed of fat. Skinless chicken or [place]. Ground chicken or [place]. Pork trimmed of fat. All fish and seafood. Eggs. Dried beans, peas, or lentils. Unsalted nuts and seeds. Unsalted canned beans. Dairy Low-fat dairy products, such as skim or 1% milk, 2% or reduced-fat cheeses, low-fat ricotta or cottage cheese, or plain low-fat yogurt. Low-sodium or reduced-sodium cheeses. Fats and Oils Tub margarines without trans fats. Light or reduced-fat mayonnaise and salad dressings (reduced sodium). Avocado. Safflower, olive, or canola oils. Natural peanut or almond butter. Other Unsalted popcorn and pretzels. The items listed above may not be a complete list of recommended foods or beverages. Contact your dietitian for more options. WHAT FOODS ARE NOT RECOMMENDED? Grains White bread. White pasta. White rice. Refined cornbread. Bagels and croissants. Crackers that contain trans fat. Vegetables Creamed or fried vegetables. Vegetables in a cheese sauce. Regular canned vegetables. Regular canned tomato sauce and paste. Regular tomato and vegetable juices. Fruits Dried fruits. Canned fruit in light or heavy syrup. Fruit juice. Meat and Other Protein Products Fatty cuts of meat. Ribs, chicken wings, bacon, sausage, bologna, salami, chitterlings, fatback, hot dogs, bratwurst, and packaged luncheon meats. Salted nuts and seeds. Canned beans with salt. Dairy Whole or 2% milk, cream, half-and-half, and  cream cheese. Whole-fat or sweetened yogurt. Full-fat cheeses or blue cheese. Nondairy creamers and whipped toppings.  Processed cheese, cheese spreads, or cheese curds. Condiments Onion and garlic salt, seasoned salt, table salt, and sea salt. Canned and packaged gravies. Worcestershire sauce. Tartar sauce. Barbecue sauce. Teriyaki sauce. Soy sauce, including reduced sodium. Steak sauce. Fish sauce. Oyster sauce. Cocktail sauce. Horseradish. Ketchup and mustard. Meat flavorings and tenderizers. Bouillon cubes. Hot sauce. Tabasco sauce. Marinades. Taco seasonings. Relishes. Fats and Oils Butter, stick margarine, lard, shortening, ghee, and bacon fat. Coconut, palm kernel, or palm oils. Regular salad dressings. Other Pickles and olives. Salted popcorn and pretzels. The items listed above may not be a complete list of foods and beverages to avoid. Contact your dietitian for more information. WHERE CAN I FIND MORE INFORMATION? National Heart, Lung, and Blood Institute: travelstabloid.com   This information is not intended to replace advice given to you by your health care provider. Make sure you discuss any questions you have with your health care provider.   Document Released: 01/25/2011 Document Revised: 02/26/2014 Document Reviewed: 12/10/2012 Elsevier Interactive Patient Education 2016 [place] Maintenance, Female Adopting a healthy lifestyle and getting preventive care can go a long way to promote health and wellness. Talk with your health care provider about what schedule of regular examinations is right for you. This is a good chance for you to check in with your provider about disease prevention and staying healthy. In between checkups, there are plenty of things you can do on your own. Experts have done a lot of research about which lifestyle changes and preventive measures are most likely to keep you healthy. Ask your health care provider for more information. WEIGHT AND DIET  Eat a healthy diet  Be sure to include plenty of vegetables, fruits, low-fat dairy  products, and lean protein.  Do not eat a lot of foods high in solid fats, added sugars, or salt.  Get regular exercise. This is one of the most important things you can do for your health.  Most adults should exercise for at least 150 minutes each week. The exercise should increase your heart rate and make you sweat (moderate-intensity exercise).  Most adults should also do strengthening exercises at least twice a week. This is in addition to the moderate-intensity exercise.  Maintain a healthy weight  Body mass index (BMI) is a measurement that can be used to identify possible weight problems. It estimates body fat based on height and weight. Your health care provider can help determine your BMI and help you achieve or maintain a healthy weight.  For females 61 years of age and older:   A BMI below 18.5 is considered underweight.  A BMI of 18.5 to 24.9 is normal.  A BMI of 25 to 29.9 is considered overweight.  A BMI of 30 and above is considered obese.  Watch levels of cholesterol and blood lipids  You should start having your blood tested for lipids and cholesterol at 80 years of age, then have this test every 5 years.  You may need to have your cholesterol levels checked more often if:  Your lipid or cholesterol levels are high.  You are older than 80 years of age.  You are at high risk for heart disease.  CANCER SCREENING   Lung Cancer  Lung cancer screening is recommended for adults 49-79 years old who are at high risk for lung cancer because of a history of smoking.  A yearly low-dose CT scan  of the lungs is recommended for people who:  Currently smoke.  Have quit within the past 15 years.  Have at least a 30-pack-year history of smoking. A pack year is smoking an average of one pack of cigarettes a day for 1 year.  Yearly screening should continue until it has been 15 years since you quit.  Yearly screening should stop if you develop a health problem that  would prevent you from having lung cancer treatment.  Breast Cancer  Practice breast self-awareness. This means understanding how your breasts normally appear and feel.  It also means doing regular breast self-exams. Let your health care provider know about any changes, no matter how small.  If you are in your 20s or 30s, you should have a clinical breast exam (CBE) by a health care provider every 1-3 years as part of a regular health exam.  If you are 42 or older, have a CBE every year. Also consider having a breast X-ray (mammogram) every year.  If you have a family history of breast cancer, talk to your health care provider about genetic screening.  If you are at high risk for breast cancer, talk to your health care provider about having an MRI and a mammogram every year.  Breast cancer gene (BRCA) assessment is recommended for women who have family members with BRCA-related cancers. BRCA-related cancers include:  Breast.  Ovarian.  Tubal.  Peritoneal cancers.  Results of the assessment will determine the need for genetic counseling and BRCA1 and BRCA2 testing. Cervical Cancer Your health care provider may recommend that you be screened regularly for cancer of the pelvic organs (ovaries, uterus, and vagina). This screening involves a pelvic examination, including checking for microscopic changes to the surface of your cervix (Pap test). You may be encouraged to have this screening done every 3 years, beginning at age 10.  For women ages 24-65, health care providers may recommend pelvic exams and Pap testing every 3 years, or they may recommend the Pap and pelvic exam, combined with testing for human papilloma virus (HPV), every 5 years. Some types of HPV increase your risk of cervical cancer. Testing for HPV may also be done on women of any age with unclear Pap test results.  Other health care providers may not recommend any screening for nonpregnant women who are considered low  risk for pelvic cancer and who do not have symptoms. Ask your health care provider if a screening pelvic exam is right for you.  If you have had past treatment for cervical cancer or a condition that could lead to cancer, you need Pap tests and screening for cancer for at least 20 years after your treatment. If Pap tests have been discontinued, your risk factors (such as having a new sexual partner) need to be reassessed to determine if screening should resume. Some women have medical problems that increase the chance of getting cervical cancer. In these cases, your health care provider may recommend more frequent screening and Pap tests. Colorectal Cancer  This type of cancer can be detected and often prevented.  Routine colorectal cancer screening usually begins at 80 years of age and continues through 80 years of age.  Your health care provider may recommend screening at an earlier age if you have risk factors for colon cancer.  Your health care provider may also recommend using home test kits to check for hidden blood in the stool.  A small camera at the end of a tube can be used to  examine your colon directly (sigmoidoscopy or colonoscopy). This is done to check for the earliest forms of colorectal cancer.  Routine screening usually begins at age 10.  Direct examination of the colon should be repeated every 5-10 years through 80 years of age. However, you may need to be screened more often if early forms of precancerous polyps or small growths are found. Skin Cancer  Check your skin from head to toe regularly.  Tell your health care provider about any new moles or changes in moles, especially if there is a change in a mole's shape or color.  Also tell your health care provider if you have a mole that is larger than the size of a pencil eraser.  Always use sunscreen. Apply sunscreen liberally and repeatedly throughout the day.  Protect yourself by wearing long sleeves, pants, a  wide-brimmed hat, and sunglasses whenever you are outside. HEART DISEASE, DIABETES, AND HIGH BLOOD PRESSURE   High blood pressure causes heart disease and increases the risk of stroke. High blood pressure is more likely to develop in:  People who have blood pressure in the high end of the normal range (130-139/85-89 mm Hg).  People who are overweight or obese.  People who are African American.  If you are 73-24 years of age, have your blood pressure checked every 3-5 years. If you are 30 years of age or older, have your blood pressure checked every year. You should have your blood pressure measured twice--once when you are at a hospital or clinic, and once when you are not at a hospital or clinic. Record the average of the two measurements. To check your blood pressure when you are not at a hospital or clinic, you can use:  An automated blood pressure machine at a pharmacy.  A home blood pressure monitor.  If you are between 80 years and 29 years old, ask your health care provider if you should take aspirin to prevent strokes.  Have regular diabetes screenings. This involves taking a blood sample to check your fasting blood sugar level.  If you are at a normal weight and have a low risk for diabetes, have this test once every three years after 80 years of age.  If you are overweight and have a high risk for diabetes, consider being tested at a younger age or more often. PREVENTING INFECTION  Hepatitis B  If you have a higher risk for hepatitis B, you should be screened for this virus. You are considered at high risk for hepatitis B if:  You were born in a country where hepatitis B is common. Ask your health care provider which countries are considered high risk.  Your parents were born in a high-risk country, and you have not been immunized against hepatitis B (hepatitis B vaccine).  You have HIV or AIDS.  You use needles to inject street drugs.  You live with someone who has  hepatitis B.  You have had sex with someone who has hepatitis B.  You get hemodialysis treatment.  You take certain medicines for conditions, including cancer, organ transplantation, and autoimmune conditions. Hepatitis C  Blood testing is recommended for:  Everyone born from [birth year] through 1963.  Anyone with known risk factors for hepatitis C. Sexually transmitted infections (STIs)  You should be screened for sexually transmitted infections (STIs) including gonorrhea and chlamydia if:  You are sexually active and are younger than 80 years of age.  You are older than 80 years of age and your health  care provider tells you that you are at risk for this type of infection.  Your sexual activity has changed since you were last screened and you are at an increased risk for chlamydia or gonorrhea. Ask your health care provider if you are at risk.  If you do not have HIV, but are at risk, it may be recommended that you take a prescription medicine daily to prevent HIV infection. This is called pre-exposure prophylaxis (PrEP). You are considered at risk if:  You are sexually active and do not regularly use condoms or know the HIV status of your partner(s).  You take drugs by injection.  You are sexually active with a partner who has HIV. Talk with your health care provider about whether you are at high risk of being infected with HIV. If you choose to begin PrEP, you should first be tested for HIV. You should then be tested every 3 months for as long as you are taking PrEP.  PREGNANCY   If you are premenopausal and you may become pregnant, ask your health care provider about preconception counseling.  If you may become pregnant, take 400 to 800 micrograms (mcg) of folic acid every day.  If you want to prevent pregnancy, talk to your health care provider about birth control (contraception). OSTEOPOROSIS AND MENOPAUSE   Osteoporosis is a disease in which the bones lose minerals and  strength with aging. This can result in serious bone fractures. Your risk for osteoporosis can be identified using a bone density scan.  If you are 73 years of age or older, or if you are at risk for osteoporosis and fractures, ask your health care provider if you should be screened.  Ask your health care provider whether you should take a calcium or vitamin D supplement to lower your risk for osteoporosis.  Menopause may have certain physical symptoms and risks.  Hormone replacement therapy may reduce some of these symptoms and risks. Talk to your health care provider about whether hormone replacement therapy is right for you.  HOME CARE INSTRUCTIONS   Schedule regular health, dental, and eye exams.  Stay current with your immunizations.   Do not use any tobacco products including cigarettes, chewing tobacco, or electronic cigarettes.  If you are pregnant, do not drink alcohol.  If you are breastfeeding, limit how much and how often you drink alcohol.  Limit alcohol intake to no more than 1 drink per day for nonpregnant women. One drink equals 12 ounces of beer, 5 ounces of wine, or 1 ounces of hard liquor.  Do not use street drugs.  Do not share needles.  Ask your health care provider for help if you need support or information about quitting drugs.  Tell your health care provider if you often feel depressed.  Tell your health care provider if you have ever been abused or do not feel safe at home.   This information is not intended to replace advice given to you by your health care provider. Make sure you discuss any questions you have with your health care provider.   Document Released: 08/21/2010 Document Revised: 02/26/2014 Document Reviewed: 01/07/2013 Elsevier Interactive Patient Education [employer].

## 2015-12-12 ENCOUNTER — Telehealth: Payer: Self-pay | Admitting: Cardiovascular Disease

## 2015-12-12 NOTE — Telephone Encounter (Signed)
Ne message   Pt verbalized that she is calling for an echo/ no order in epic  Only ov recall for November, pt declined to make appt she said that it is suppose to be an echo instead of ov  Please call pt

## 2015-12-13 NOTE — Telephone Encounter (Signed)
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 Dr Burt Knack only sees the pt at [facility] [place] [facility] after TAVR with an echocardiogram and otherwise all other cardiology care is assumed by primary cardiologist.  Dr Sallyanne Kuster is who recommended repeat Echocardiogram in November.  I will forward this message to his CMA to address.

## 2015-12-14 NOTE — Telephone Encounter (Signed)
lmtcb Echocardiogram has already been scheduled for 12/29/2015. This was ordered and scheduled in July 2017.

## 2015-12-26 ENCOUNTER — Ambulatory Visit
Admission: RE | Admit: 2015-12-26 | Discharge: 2015-12-26 | Disposition: A | Discharge status: Home / Self Care | Payer: Medicare Other | Source: Ambulatory Visit | Attending: Nurse Practitioner | Admitting: Nurse Practitioner

## 2015-12-26 DIAGNOSIS — E2839 Other primary ovarian failure: Secondary | ICD-10-CM

## 2015-12-29 ENCOUNTER — Ambulatory Visit (HOSPITAL_COMMUNITY): Payer: Medicare Other | Attending: Cardiology

## 2015-12-29 ENCOUNTER — Other Ambulatory Visit: Payer: Self-pay

## 2015-12-29 DIAGNOSIS — Z953 Presence of xenogenic heart valve: Secondary | ICD-10-CM | POA: Insufficient documentation

## 2015-12-29 DIAGNOSIS — E785 Hyperlipidemia, unspecified: Secondary | ICD-10-CM | POA: Insufficient documentation

## 2015-12-29 DIAGNOSIS — I509 Heart failure, unspecified: Secondary | ICD-10-CM | POA: Diagnosis not present

## 2015-12-29 DIAGNOSIS — I110 Hypertensive heart disease with heart failure: Secondary | ICD-10-CM | POA: Insufficient documentation

## 2015-12-29 DIAGNOSIS — I340 Nonrheumatic mitral (valve) insufficiency: Secondary | ICD-10-CM | POA: Diagnosis not present

## 2015-12-29 DIAGNOSIS — I350 Nonrheumatic aortic (valve) stenosis: Secondary | ICD-10-CM | POA: Diagnosis not present

## 2015-12-29 DIAGNOSIS — I359 Nonrheumatic aortic valve disorder, unspecified: Secondary | ICD-10-CM | POA: Diagnosis present

## 2016-01-02 ENCOUNTER — Telehealth: Payer: Self-pay | Admitting: *Deleted

## 2016-01-02 NOTE — Telephone Encounter (Signed)
Should stop taking to see if this helps improve rash, if needed can make appt for OV

## 2016-01-02 NOTE — Telephone Encounter (Signed)
Patient daughter, [name] called and stated that ever since patient started taking the Mirtazapine she has a painful itchy rash on stomach and back. Please Advise.

## 2016-01-03 ENCOUNTER — Telehealth: Payer: Self-pay | Admitting: *Deleted

## 2016-01-03 NOTE — Telephone Encounter (Signed)
-----   Message from Sanda Klein, MD sent at 01/02/2016  6:04 PM EST ----- Normal heart pumping function and normal aortic stent valve function. Mild pulmonary HTN (chronic, unchanged).

## 2016-01-03 NOTE — Telephone Encounter (Signed)
[name] notified and agreed.

## 2016-01-03 NOTE — Telephone Encounter (Signed)
Spoke to patient's daughter [name]. Result given . Verbalized understanding

## 2016-01-05 ENCOUNTER — Ambulatory Visit (INDEPENDENT_AMBULATORY_CARE_PROVIDER_SITE_OTHER): Payer: Medicare Other | Admitting: Nurse Practitioner

## 2016-01-05 ENCOUNTER — Encounter: Payer: Self-pay | Admitting: Nurse Practitioner

## 2016-01-05 VITALS — BP 144/82 | HR 75 | Temp 98.2°F | Resp 17 | Ht 64.0 in | Wt 141.6 lb

## 2016-01-05 DIAGNOSIS — M810 Age-related osteoporosis without current pathological fracture: Secondary | ICD-10-CM

## 2016-01-05 DIAGNOSIS — R21 Rash and other nonspecific skin eruption: Secondary | ICD-10-CM

## 2016-01-05 MED ORDER — METHYLPREDNISOLONE ACETATE 40 MG/ML IJ SUSP
40.0000 mg | Freq: Once | INTRAMUSCULAR | Status: AC
  Administered 2016-01-05: 40 mg via INTRAMUSCULAR

## 2016-01-05 NOTE — Patient Instructions (Addendum)
To use eucerin cream twice daily May take oatmeal bath as needed  Steroid given today in the office  Notify us if rash does not improve or worsens  Recommended to take calcium 12000 units daily for osteoporosis, we will let you know about the Prolia

## 2016-01-05 NOTE — Progress Notes (Signed)
Careteam: Patient Care Team: Lauree Chandler, NP as PCP - General (Geriatric Medicine) Milus Banister, MD as Attending Physician (Gastroenterology) Alphonsa Overall, MD as Consulting Physician (General Surgery) Sherren Mocha, MD as Consulting Physician (Cardiology)  Advanced Directive information Does patient have an advance directive?: Yes, Type of Advance Directive: [place];Living will  Allergies  Allergen Reactions  . Aspirin Nausea And Vomiting  . Codeine Nausea And Vomiting  . Phenytoin Nausea And Vomiting    Chief Complaint  Patient presents with  . Follow-up    discuss bone density  . Acute Visit    itchy rash     HPI: Patient is a 82 y.o. female seen in the office today to discuss bone density test.  Reports she is unable to tolerate calcium tablet due to really bad acid reflex. This has been may years ago and has not tried to take calcium since. Has not been on medication for OP. Reviewed dexa scan with pt.   Has rash-- started after last visit, has all over abdomen and gone to back and to the top of her legs. Scratching areas which made it worse.  Using hydrocortisone cream that has helped but using a lot of this.  Thought it may have been related to remeron but didn't start until 1 month after she started using medication. Has quit taking and still not better.  Nothing new that she is aware of, no new detergents, lotions, soap, foods etc  Review of Systems:  Review of Systems  Constitutional: Negative for activity change and appetite change.  HENT: Positive for hearing loss. Negative for congestion.   Eyes: Negative.   Respiratory: Negative for shortness of breath.   Cardiovascular: Negative for chest pain and palpitations.  Gastrointestinal: Negative for abdominal pain, constipation and diarrhea.  Musculoskeletal: Negative for arthralgias and myalgias.  Skin: Positive for rash. Negative for color change and wound.    Past Medical  History:  Diagnosis Date  . Anxiety   . Arthritis    Both knees R>L  . Chronic diastolic congestive heart failure, NYHA class 2 ([place])   . GERD (gastroesophageal reflux disease) 05/14/2011  . Heart murmur   . Hyperlipidemia   . Hypertension   . Lumbago   . 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 Severe aortic stenosis 12/06/2013  . Shortness of breath dyspnea    Past Surgical History:  Procedure Laterality Date  . CARPAL TUNNEL RELEASE Right 10/31/2015  . CATARACT EXTRACTION Bilateral   . CHOLECYSTECTOMY  2008  . EYE SURGERY    . INTRAOPERATIVE TRANSESOPHAGEAL ECHOCARDIOGRAM N/A 01/19/2014   Procedure: INTRAOPERATIVE TRANSESOPHAGEAL ECHOCARDIOGRAM;  Surgeon: Sherren Mocha, MD;  Location: [facility] OR;  Service: Open Heart Surgery;  Laterality: N/A;  . KNEE ARTHROSCOPY Bilateral   . LEFT AND RIGHT HEART CATHETERIZATION WITH CORONARY ANGIOGRAM N/A 12/08/2013   Procedure: LEFT AND RIGHT HEART CATHETERIZATION WITH CORONARY ANGIOGRAM;  Surgeon: Sanda Klein, MD;  Location: [facility] CATH LAB;  Service: Cardiovascular;  Laterality: N/A;  . REFRACTIVE SURGERY Bilateral   . REPLACEMENT TOTAL KNEE Right   . TONSILLECTOMY     70 years ago  . TOTAL KNEE ARTHROPLASTY Right 04/28/2014   Procedure: TOTAL KNEE ARTHROPLASTY;  Surgeon: Kathryne Hitch, MD;  Location: [place];  Service: Orthopedics;  Laterality: Right;  . TRANSCATHETER AORTIC VALVE REPLACEMENT, TRANSFEMORAL N/A 01/19/2014   Procedure: TRANSCATHETER AORTIC VALVE  REPLACEMENT, TRANSFEMORAL;  Surgeon: Sherren Mocha, MD;  Location: [place];  Service: Open Heart Surgery;  Laterality: N/A;   Social History:   reports that she has never smoked. She has never used smokeless tobacco. She reports that she does not drink alcohol or use drugs.  Family History  Problem Relation Age of Onset  . Muscular dystrophy Father     Died at 71  . Stroke  Mother   . Ovarian cancer Sister   . Breast cancer Sister   . Heart attack Sister     Died at 60  . Colon cancer Neg Hx   . Hypertension Neg Hx     Medications: Patient's Medications  New Prescriptions   No medications on file  Previous Medications   AMLODIPINE (NORVASC) 10 MG TABLET    Take 10 mg by mouth daily.   ASCORBIC ACID (VITAMIN C) 1000 MG TABLET    Take 1,000 mg by mouth 2 (two) times daily.    CARVEDILOL (COREG) 12.5 MG TABLET    Take 1 tablet (12.5 mg total) by mouth 2 (two) times daily.   CHOLECALCIFEROL (VITAMIN D) 1000 UNITS TABLET    Take 1,000 Units by mouth 2 (two) times daily.    FENOFIBRATE 160 MG TABLET    Take 160 mg by mouth daily.   FLUTICASONE (FLONASE) 50 MCG/ACT NASAL SPRAY    Place 1 spray into both nostrils 2 (two) times daily as needed (congestion).   HYDROCHLOROTHIAZIDE (HYDRODIURIL) 25 MG TABLET    Take 25 mg by mouth daily.   MIRTAZAPINE (REMERON) 15 MG TABLET    Take 1 tablet (15 mg total) by mouth at bedtime.   MULTIPLE VITAMINS-MINERALS (ICAPS AREDS 2) CAPS    Take 2 capsules by mouth daily.   OMEPRAZOLE (PRILOSEC) 40 MG CAPSULE    Take 1 capsule (40 mg total) by mouth daily.   ONDANSETRON (ZOFRAN) 4 MG TABLET    Take 4 mg by mouth daily as needed for nausea or vomiting.  Modified Medications   No medications on file  Discontinued Medications   No medications on file     Physical Exam:  Vitals:   01/05/16 1410  BP: (!) 144/82  Pulse: 75  Resp: 17  Temp: 98.2 F (36.8 C)  TempSrc: Oral  SpO2: 95%  Weight: 141 lb 9.6 oz (64.2 kg)  Height: 5\' 4"  (1.626 m)   Body mass index is 24.31 kg/m.  Physical Exam  Constitutional: She is oriented to person, place, and time. She appears well-developed and well-nourished. No distress.  HENT:  Head: Normocephalic and atraumatic.  Mouth/Throat: Oropharynx is clear and moist. No oropharyngeal exudate.  Eyes: Conjunctivae are normal. Pupils are equal, round, and reactive to light.  Neck: Normal  range of motion. Neck supple.  Cardiovascular: Normal rate and regular rhythm.   Murmur heard. Pulmonary/Chest: Effort normal and breath sounds normal.  Abdominal: Soft. Bowel sounds are normal.  Musculoskeletal: She exhibits no edema or tenderness.  Neurological: She is alert and oriented to person, place, and time. No cranial nerve deficit.  Skin: Skin is warm and dry. Rash noted. She is not diaphoretic.  Diffuse Pruritic papular rash  Psychiatric: She has a normal mood and affect.  Labs reviewed: Basic Metabolic Panel:  Recent Labs  10/04/15 12/02/15 0802  NA 139 137  K 4.5 4.2  CL  --  101  CO2  --  25  GLUCOSE  --  91  BUN 23* 21  CREATININE 1.3* 1.25*  CALCIUM  --  9.9  TSH  --  2.93   Liver Function Tests:  Recent Labs  10/04/15 12/02/15 0802  AST 13 15  ALT 11 12  ALKPHOS 64 76  BILITOT  --  0.4  PROT  --  7.2  ALBUMIN  --  3.8   No results for input(s): LIPASE, AMYLASE in the last 8760 hours. No results for input(s): AMMONIA in the last 8760 hours. CBC:  Recent Labs  12/02/15 0802  WBC 6.1  NEUTROABS 3,477  HGB 11.7  HCT 35.7  MCV 87.7  PLT 385   Lipid Panel:  Recent Labs  10/04/15 12/02/15 0802  CHOL 216* 222*  HDL 61 61  LDLCALC 136 136*  TRIG 93 126  CHOLHDL  --  3.6   TSH:  Recent Labs  12/02/15 0802  TSH 2.93   A1C: Lab Results  Component Value Date   HGBA1C 5.4 01/15/2014     Assessment/Plan 1. Osteoporosis without current pathological fracture, unspecified osteoporosis type -Recommended to take calcium 1200 mg daily with her vit d -discussed treatment for OP, does not wish to take PO medication due to GERD hx -will fill out paperwork to see what insurance covers for Prolia and pt interested in this pending out of pocket expense.   2. Rash and nonspecific skin eruption - methylPREDNISolone acetate (DEPO-MEDROL) injection 40 mg; Inject 1 mL (40 mg total) into the muscle once. Given in office -to use eurcerin cream as  needed -oatmeal baths to help itching -to call if rash does not improve or worsens   Shawntee Mainwaring K. Harle Battiest  [facility] [phone] 8 am - 5 pm) [phone] (after hours)

## 2016-01-16 LAB — HM MAMMOGRAPHY

## 2016-01-18 ENCOUNTER — Encounter: Payer: Self-pay | Admitting: *Deleted

## 2016-03-08 ENCOUNTER — Ambulatory Visit: Payer: Medicare Other | Admitting: Internal Medicine

## 2016-03-09 ENCOUNTER — Encounter: Payer: Self-pay | Admitting: Internal Medicine

## 2016-03-09 ENCOUNTER — Ambulatory Visit (INDEPENDENT_AMBULATORY_CARE_PROVIDER_SITE_OTHER): Payer: Medicare Other | Admitting: Internal Medicine

## 2016-03-09 VITALS — BP 140/80 | HR 78 | Temp 98.3°F | Wt 144.0 lb

## 2016-03-09 DIAGNOSIS — I350 Nonrheumatic aortic (valve) stenosis: Secondary | ICD-10-CM

## 2016-03-09 DIAGNOSIS — R21 Rash and other nonspecific skin eruption: Secondary | ICD-10-CM

## 2016-03-09 DIAGNOSIS — R634 Abnormal weight loss: Secondary | ICD-10-CM | POA: Diagnosis not present

## 2016-03-09 DIAGNOSIS — M48061 Spinal stenosis, lumbar region without neurogenic claudication: Secondary | ICD-10-CM

## 2016-03-09 DIAGNOSIS — M5136 Other intervertebral disc degeneration, lumbar region: Secondary | ICD-10-CM | POA: Diagnosis not present

## 2016-03-09 DIAGNOSIS — F329 Major depressive disorder, single episode, unspecified: Secondary | ICD-10-CM

## 2016-03-09 DIAGNOSIS — M51369 Other intervertebral disc degeneration, lumbar region without mention of lumbar back pain or lower extremity pain: Secondary | ICD-10-CM

## 2016-03-09 DIAGNOSIS — F32A Depression, unspecified: Secondary | ICD-10-CM

## 2016-03-09 MED ORDER — FENOFIBRATE 160 MG PO TABS: 160.0000 mg | ORAL_TABLET | Freq: Every day | ORAL | 3 refills | Status: DC

## 2016-03-09 MED ORDER — MIRTAZAPINE 15 MG PO TABS: 15.0000 mg | ORAL_TABLET | Freq: Every day | ORAL | 3 refills | Status: DC

## 2016-03-09 MED ORDER — CARVEDILOL 12.5 MG PO TABS: 12.5000 mg | ORAL_TABLET | Freq: Two times a day (BID) | ORAL | 3 refills | Status: DC

## 2016-03-09 MED ORDER — HYDROCHLOROTHIAZIDE 25 MG PO TABS: 25.0000 mg | ORAL_TABLET | Freq: Every day | ORAL | 3 refills | Status: DC

## 2016-03-09 MED ORDER — OMEPRAZOLE 40 MG PO CPDR: 40.0000 mg | DELAYED_RELEASE_CAPSULE | Freq: Every day | ORAL | 3 refills | Status: DC

## 2016-03-09 MED ORDER — AMLODIPINE BESYLATE 10 MG PO TABS: 10.0000 mg | ORAL_TABLET | Freq: Every day | ORAL | 3 refills | Status: DC

## 2016-03-09 NOTE — Progress Notes (Signed)
Location:  [facility] Provider:  Syleena Mchan L. Mariea Clonts, D.O., C.M.D.  Goals of Care:  Advanced Directives 03/09/2016  Does Patient Have a Medical Advance Directive? Yes  Type of Advance Directive -  Does patient want to make changes to medical advance directive? -  Copy of [place] in Chart? -   Chief Complaint  Patient presents with  . Medical Management of Chronic Issues    3MTH FOLLOW-UP    HPI: Patient is a 83 y.o. female seen today for medical management of chronic diseases.  She has a h/o chronic diastolic chf, GERD, htn, hl, low back pain, h/o TAVR, arthritis seen today for physician visit.  [name] is PCP.  She still has a rash on her body.  She has just about scratched herself to death. Steroid shot did not help.  She has large welts on her back from her pelvic area up her torso and her back.  A little on medial thighs.  No pain.  Ongoing since November.  She is also using hydrocortisone cream which has helped.  Has tried aloe vera for itch and also cetaphil cream.  No changes to meds except the mirtazapine for depression.  She gained two lbs since last visit in November.  She has perked back up and eating well.    She has had terrible back pain since sept of 2016.  She had 9 shots for that.  She wonders if the shots caused the dry skin and rash.  She also had 8 accupuncture treatments and back still hurts.  Had cancer screening, MRIs and no clear reason found for back pain.  She does a little exercise on the stationary bike and the exercises from PT. Left SI joint.  Can walk ok with limp and cane.  If stands for a long time, it goes all the way across, she must sit due to pain.  No radiculopathy. It just hurts.  Dr. Hal Neer, Dr. Lucia Gaskins, Dr. Edythe Lynn, Dr. Ace Gins all seen.  Dr. Ace Gins did the MRI.  Dr. Nelva Bush also had done an xray.    Takes two tylenol pm almost every night before bed.  Suggested taking regular tylenol when the pain begins.  Avoid tylenol pm.    Past  Medical History:  Diagnosis Date  . Anxiety   . Arthritis    Both knees R>L  . Chronic diastolic congestive heart failure, NYHA class 2 ([place])   . GERD (gastroesophageal reflux disease) 05/14/2011  . Heart murmur   . Hyperlipidemia   . Hypertension   . Lumbago   . 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 Severe aortic stenosis 12/06/2013  . Shortness of breath dyspnea     Past Surgical History:  Procedure Laterality Date  . CARPAL TUNNEL RELEASE Right 10/31/2015  . CATARACT EXTRACTION Bilateral   . CHOLECYSTECTOMY  2008  . EYE SURGERY    . INTRAOPERATIVE TRANSESOPHAGEAL ECHOCARDIOGRAM N/A 01/19/2014   Procedure: INTRAOPERATIVE TRANSESOPHAGEAL ECHOCARDIOGRAM;  Surgeon: Sherren Mocha, MD;  [facility] OR;  Service: Open Heart Surgery;  Laterality: N/A;  . KNEE ARTHROSCOPY Bilateral   . LEFT AND RIGHT HEART CATHETERIZATION WITH CORONARY ANGIOGRAM N/A 12/08/2013   Procedure: LEFT AND RIGHT HEART CATHETERIZATION WITH CORONARY ANGIOGRAM;  Surgeon: Sanda Klein, MD;  Location: [facility] CATH LAB;  Service: Cardiovascular;  Laterality: N/A;  . REFRACTIVE SURGERY Bilateral   . REPLACEMENT TOTAL KNEE Right   .  TONSILLECTOMY     70 years ago  . TOTAL KNEE ARTHROPLASTY Right 04/28/2014   Procedure: TOTAL KNEE ARTHROPLASTY;  Surgeon: Kathryne Hitch, MD;  Location: [place];  Service: Orthopedics;  Laterality: Right;  . TRANSCATHETER AORTIC VALVE REPLACEMENT, TRANSFEMORAL N/A 01/19/2014   Procedure: TRANSCATHETER AORTIC VALVE REPLACEMENT, TRANSFEMORAL;  Surgeon: Sherren Mocha, MD;  Location: [place];  Service: Open Heart Surgery;  Laterality: N/A;    Allergies  Allergen Reactions  . Aspirin Nausea And Vomiting  . Codeine Nausea And Vomiting  . Phenytoin Nausea And Vomiting    Allergies as of 03/09/2016      Reactions   Aspirin Nausea And Vomiting   Codeine Nausea And Vomiting    Phenytoin Nausea And Vomiting      Medication List       Accurate as of 03/09/16 11:57 AM. Always use your most recent med list.          amLODipine 10 MG tablet Commonly known as:  NORVASC Take 10 mg by mouth daily.   carvedilol 12.5 MG tablet Commonly known as:  COREG Take 1 tablet (12.5 mg total) by mouth 2 (two) times daily.   cholecalciferol 1000 units tablet Commonly known as:  VITAMIN D Take 1,000 Units by mouth 2 (two) times daily.   fenofibrate 160 MG tablet Take 160 mg by mouth daily.   fluticasone 50 MCG/ACT nasal spray Commonly known as:  FLONASE Place 1 spray into both nostrils 2 (two) times daily as needed (congestion).   hydrochlorothiazide 25 MG tablet Commonly known as:  HYDRODIURIL Take 25 mg by mouth daily.   ICAPS AREDS 2 Caps Take 2 capsules by mouth daily.   mirtazapine 15 MG tablet Commonly known as:  REMERON Take 1 tablet (15 mg total) by mouth at bedtime.   omeprazole 40 MG capsule Commonly known as:  PRILOSEC Take 1 capsule (40 mg total) by mouth daily.   ondansetron 4 MG tablet Commonly known as:  ZOFRAN Take 4 mg by mouth daily as needed for nausea or vomiting.   vitamin C 1000 MG tablet Take 1,000 mg by mouth 2 (two) times daily.       Review of Systems:  Review of Systems  Constitutional: Negative for chills, fever and malaise/fatigue.       Weight gain  HENT: Negative for congestion.   Eyes:       Glasses  Respiratory: Negative for shortness of breath.   Cardiovascular: Negative for chest pain, palpitations and leg swelling.  Gastrointestinal: Negative for abdominal pain.  Genitourinary: Negative for dysuria.  Musculoskeletal: Positive for back pain and myalgias. Negative for falls.  Skin: Positive for itching and rash.  Neurological: Negative for dizziness and weakness.  Endo/Heme/Allergies: Bruises/bleeds easily.  Psychiatric/Behavioral: Negative for depression and memory loss. The patient does not have insomnia.       Health Maintenance  Topic Date Due  . TETANUS/TDAP  04/09/1944  . PNA vac Low Risk Adult (2 of 2 - PCV13) 12/05/2016  . MAMMOGRAM  01/15/2017  . INFLUENZA VACCINE  Completed  . DEXA SCAN  Completed  . ZOSTAVAX  Completed    Physical Exam: Vitals:   03/09/16 1131  BP: 140/80  Pulse: 78  Temp: 98.3 F (36.8 C)  TempSrc: Oral  SpO2: 95%  Weight: 144 lb (65.3 kg)   Body mass index is 24.72 kg/m. Physical Exam  Constitutional: She is oriented to person, place, and time. She appears well-developed and well-nourished. No distress.  Cardiovascular: Normal rate,  regular rhythm and intact distal pulses.   Murmur heard. Pulmonary/Chest: Effort normal and breath sounds normal. No respiratory distress.  Abdominal: Soft. Bowel sounds are normal. She exhibits no distension. There is no tenderness.  Musculoskeletal: Normal range of motion. She exhibits tenderness.  Left sacroiliac joint  Neurological: She is alert and oriented to person, place, and time.  Skin: Skin is warm and dry. Capillary refill takes less than 2 seconds.  Very dry scaly skin especially of torso  Psychiatric: She has a normal mood and affect.    Labs reviewed: Basic Metabolic Panel:  Recent Labs  10/04/15 12/02/15 0802  NA 139 137  K 4.5 4.2  CL  --  101  CO2  --  25  GLUCOSE  --  91  BUN 23* 21  CREATININE 1.3* 1.25*  CALCIUM  --  9.9  TSH  --  2.93   Liver Function Tests:  Recent Labs  10/04/15 12/02/15 0802  AST 13 15  ALT 11 12  ALKPHOS 64 76  BILITOT  --  0.4  PROT  --  7.2  ALBUMIN  --  3.8   No results for input(s): LIPASE, AMYLASE in the last 8760 hours. No results for input(s): AMMONIA in the last 8760 hours. CBC:  Recent Labs  12/02/15 0802  WBC 6.1  NEUTROABS 3,477  HGB 11.7  HCT 35.7  MCV 87.7  PLT 385   Lipid Panel:  Recent Labs  10/04/15 12/02/15 0802  CHOL 216* 222*  HDL 61 61  LDLCALC 136 136*  TRIG 93 126  CHOLHDL  --  3.6   Lab Results  Component  Value Date   HGBA1C 5.4 01/15/2014   Assessment/Plan 1. Rash and nonspecific skin eruption -dry skin only remains -suspect it was a detergent she was using b/c it still recurs occasionally -counseled to continue her new products instead and also to use the cetaphil regularly for hydration of skin  2. Loss of weight - resolved, has now gained - mirtazapine (REMERON) 15 MG tablet; Take 1 tablet (15 mg total) by mouth at bedtime.  Dispense: 90 tablet; Refill: 3  3. Lumbar degenerative disc disease -remains problematic -recommended more consistent use of REGULAR tylenol not tylenol PM and heating pad, consider topicals and more therapy if continues to bother her and interfere with qol  4. Depression, unspecified depression type -much better on remeron 15mg , cont same for now--might taper off if weight gain getting out of control  5. Spinal stenosis, lumbar region, without neurogenic claudication -also has this, see #3  6. Severe aortic valve stenosis s/p TAVR -stable, asymptomatic, follows with Dr. Burt Knack  Labs/tests ordered:  No orders of the defined types were placed in this encounter.  Next appt:  06/04/2016 with [place] Ranie Chinchilla, D.O. [facility] [address], [place] [address] Cell Phone (Mon-Fri 8am-5pm):  [phone] On Call:  [phone] & follow prompts after 5pm & weekends Office Phone:  [phone] Office Fax:  [phone]

## 2016-03-09 NOTE — Patient Instructions (Addendum)
Use heat for pain Also use tylenol when you start hurting. Continue exercises from PT Also continue to use the cetaphil regularly to moisturize your skin You may try zyrtec for itching, but do not take it forever

## 2016-06-04 ENCOUNTER — Encounter: Payer: Self-pay | Admitting: Nurse Practitioner

## 2016-06-04 ENCOUNTER — Ambulatory Visit (INDEPENDENT_AMBULATORY_CARE_PROVIDER_SITE_OTHER): Payer: Medicare Other | Admitting: Nurse Practitioner

## 2016-06-04 ENCOUNTER — Telehealth: Payer: Self-pay | Admitting: Nurse Practitioner

## 2016-06-04 VITALS — BP 148/82 | HR 77 | Temp 97.6°F | Resp 18 | Ht 64.0 in | Wt 144.8 lb

## 2016-06-04 DIAGNOSIS — R634 Abnormal weight loss: Secondary | ICD-10-CM | POA: Diagnosis not present

## 2016-06-04 DIAGNOSIS — I2510 Atherosclerotic heart disease of native coronary artery without angina pectoris: Secondary | ICD-10-CM

## 2016-06-04 DIAGNOSIS — E785 Hyperlipidemia, unspecified: Secondary | ICD-10-CM | POA: Diagnosis not present

## 2016-06-04 DIAGNOSIS — F32A Depression, unspecified: Secondary | ICD-10-CM

## 2016-06-04 DIAGNOSIS — F329 Major depressive disorder, single episode, unspecified: Secondary | ICD-10-CM

## 2016-06-04 DIAGNOSIS — I10 Essential (primary) hypertension: Secondary | ICD-10-CM | POA: Diagnosis not present

## 2016-06-04 DIAGNOSIS — M810 Age-related osteoporosis without current pathological fracture: Secondary | ICD-10-CM | POA: Diagnosis not present

## 2016-06-04 DIAGNOSIS — F419 Anxiety disorder, unspecified: Secondary | ICD-10-CM

## 2016-06-04 DIAGNOSIS — M5136 Other intervertebral disc degeneration, lumbar region: Secondary | ICD-10-CM | POA: Diagnosis not present

## 2016-06-04 DIAGNOSIS — M51369 Other intervertebral disc degeneration, lumbar region without mention of lumbar back pain or lower extremity pain: Secondary | ICD-10-CM

## 2016-06-04 LAB — LIPID PANEL
CHOL/HDL RATIO: 4 ratio (ref <5.0)
CHOLESTEROL: 234 mg/dL — AB (ref <200)
HDL: 59 mg/dL (ref >50)
LDL CALC: 151 mg/dL — AB (ref <100)
TRIGLYCERIDES: 122 mg/dL (ref <150)
VLDL: 24 mg/dL (ref <30)

## 2016-06-04 LAB — COMPLETE METABOLIC PANEL WITH GFR
ALBUMIN: 4.2 g/dL (ref 3.6–5.1)
ALK PHOS: 50 U/L (ref 33–130)
ALT: 10 U/L (ref 6–29)
AST: 16 U/L (ref 10–35)
BUN: 27 mg/dL — AB (ref 7–25)
CALCIUM: 10.1 mg/dL (ref 8.6–10.4)
CHLORIDE: 104 mmol/L (ref 98–110)
CO2: 27 mmol/L (ref 20–31)
Creat: 1.26 mg/dL — ABNORMAL HIGH (ref 0.60–0.88)
GFR, EST AFRICAN AMERICAN: 44 mL/min — AB (ref >=60)
GFR, Est Non African American: 38 mL/min — ABNORMAL LOW (ref >=60)
Glucose, Bld: 85 mg/dL (ref 65–99)
POTASSIUM: 4.4 mmol/L (ref 3.5–5.3)
Sodium: 140 mmol/L (ref 135–146)
Total Bilirubin: 0.5 mg/dL (ref 0.2–1.2)
Total Protein: 7.6 g/dL (ref 6.1–8.1)

## 2016-06-04 LAB — CBC WITH DIFFERENTIAL/PLATELET
BASOS PCT: 0 %
Basophils Absolute: 0 cells/uL (ref 0–200)
EOS PCT: 2 %
Eosinophils Absolute: 128 cells/uL (ref 15–500)
HEMATOCRIT: 36.7 % (ref 35.0–45.0)
HEMOGLOBIN: 11.9 g/dL (ref 11.7–15.5)
LYMPHS ABS: 1920 {cells}/uL (ref 850–3900)
LYMPHS PCT: 30 %
MCH: 29 pg (ref 27.0–33.0)
MCHC: 32.4 g/dL (ref 32.0–36.0)
MCV: 89.3 fL (ref 80.0–100.0)
MPV: 10.8 fL (ref 7.5–12.5)
Monocytes Absolute: 768 cells/uL (ref 200–950)
Monocytes Relative: 12 %
NEUTROS PCT: 56 %
Neutro Abs: 3584 cells/uL (ref 1500–7800)
Platelets: 395 10*3/uL (ref 140–400)
RBC: 4.11 MIL/uL (ref 3.80–5.10)
RDW: 13.5 % (ref 11.0–15.0)
WBC: 6.4 10*3/uL (ref 3.8–10.8)

## 2016-06-04 NOTE — Telephone Encounter (Signed)
FYI, You referred patient for Physical Therapy, I received an epic referral message from [name] with [facility], patient has declined therapy as she will have a $40 dollar co-pay and can not afford any added expenses at this time.

## 2016-06-04 NOTE — Patient Instructions (Addendum)
Add tylenol 500 mg to your morning medication May have tylenol 500 mg tablet every 6 hours as needed for pain.   Will get fasting lab work today  We will check cost reclast for Osteoporosis

## 2016-06-04 NOTE — Progress Notes (Signed)
Careteam: Patient Care Team: Lauree Chandler, NP as PCP - General (Geriatric Medicine) Milus Banister, MD as Attending Physician (Gastroenterology) Alphonsa Overall, MD as Consulting Physician (General Surgery) Sherren Mocha, MD as Consulting Physician (Cardiology)  Advanced Directive information Does Patient Have a Medical Advance Directive?: Yes, Type of Advance Directive: [place];Living will  Allergies  Allergen Reactions  . Aspirin Nausea And Vomiting  . Codeine Nausea And Vomiting  . Phenytoin Nausea And Vomiting    Chief Complaint  Patient presents with  . Medical Management of Chronic Issues    Pt is being seen for a 3 month follow up. Pt does not want prolia due to cost.      HPI: Patient is a 83 y.o. female seen in the office today for routine visit. She has a h/o chronic diastolic chf, GERD, htn, hl, low back pain, h/o TAVR, arthritis. Pt does not wish to get prolia due to co-pay.  Can not afford the co-pay. Discussed reclast- does not know if she wants to do this either.  Back is still very bad, spent a lot of money for back and "no better off than the day she went".  Seeing multiple doctors for her back- 9 shots, body scan, MRIs and Xrays and acupuncture and she is no better. "Orthopedic surgeon finally said they do not know what is wrong" daughter reports she has spinal stenosis.  Mood is okay a little ill due to back pain, pain limites her activity.  Tylenol helps a little but does not take it often, hurts when she tries to walk.  conts to take Remeron, appetite is good. No nausea.  Having a hard time sleeping but has always had this. Brain is going 1000 miles an hour. Lots of anxiety.   Review of Systems:  Review of Systems  Constitutional: Negative for activity change and appetite change.  HENT: Positive for hearing loss. Negative for congestion.   Eyes: Negative.   Respiratory: Negative for shortness of breath.   Cardiovascular:  Negative for chest pain and palpitations.  Gastrointestinal: Negative for abdominal pain, constipation and diarrhea.  Musculoskeletal: Negative for arthralgias and myalgias.  Skin: Negative for color change, rash and wound.    Past Medical History:  Diagnosis Date  . Anxiety   . Arthritis    Both knees R>L  . Chronic diastolic congestive heart failure, NYHA class 2 ([place])   . GERD (gastroesophageal reflux disease) 05/14/2011  . Heart murmur   . Hyperlipidemia   . Hypertension   . Lumbago   . 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 Severe aortic stenosis 12/06/2013  . Shortness of breath dyspnea    Past Surgical History:  Procedure Laterality Date  . CARPAL TUNNEL RELEASE Right 10/31/2015  . CATARACT EXTRACTION Bilateral   . CHOLECYSTECTOMY  2008  . EYE SURGERY    . INTRAOPERATIVE TRANSESOPHAGEAL ECHOCARDIOGRAM N/A 01/19/2014   Procedure: INTRAOPERATIVE TRANSESOPHAGEAL ECHOCARDIOGRAM;  Surgeon: Sherren Mocha, MD;  Location: [facility] OR;  Service: Open Heart Surgery;  Laterality: N/A;  . KNEE ARTHROSCOPY Bilateral   . LEFT AND RIGHT HEART CATHETERIZATION WITH CORONARY ANGIOGRAM N/A 12/08/2013   Procedure: LEFT AND RIGHT HEART CATHETERIZATION WITH CORONARY ANGIOGRAM;  Surgeon: Sanda Klein, MD;  Location: [facility] CATH LAB;  Service: Cardiovascular;  Laterality: N/A;  . REFRACTIVE SURGERY Bilateral   . REPLACEMENT TOTAL KNEE Right   .  TONSILLECTOMY     70 years ago  . TOTAL KNEE ARTHROPLASTY Right 04/28/2014   Procedure: TOTAL KNEE ARTHROPLASTY;  Surgeon: Kathryne Hitch, MD;  Location: [place];  Service: Orthopedics;  Laterality: Right;  . TRANSCATHETER AORTIC VALVE REPLACEMENT, TRANSFEMORAL N/A 01/19/2014   Procedure: TRANSCATHETER AORTIC VALVE REPLACEMENT, TRANSFEMORAL;  Surgeon: Sherren Mocha, MD;  Location: [place];  Service: Open Heart Surgery;  Laterality: N/A;   Social  History:   reports that she has never smoked. She has never used smokeless tobacco. She reports that she does not drink alcohol or use drugs.  Family History  Problem Relation Age of Onset  . Muscular dystrophy Father     Died at 69  . Stroke Mother   . Ovarian cancer Sister   . Breast cancer Sister   . Heart attack Sister     Died at 54  . Colon cancer Neg Hx   . Hypertension Neg Hx     Medications: Patient's Medications  New Prescriptions   No medications on file  Previous Medications   AMLODIPINE (NORVASC) 10 MG TABLET    Take 1 tablet (10 mg total) by mouth daily.   ASCORBIC ACID (VITAMIN C) 1000 MG TABLET    Take 1,000 mg by mouth 2 (two) times daily.    CARVEDILOL (COREG) 12.5 MG TABLET    Take 1 tablet (12.5 mg total) by mouth 2 (two) times daily.   CHOLECALCIFEROL (VITAMIN D) 1000 UNITS TABLET    Take 1,000 Units by mouth 2 (two) times daily.    FENOFIBRATE 160 MG TABLET    Take 1 tablet (160 mg total) by mouth daily.   FLUTICASONE (FLONASE) 50 MCG/ACT NASAL SPRAY    Place 1 spray into both nostrils 2 (two) times daily as needed (congestion).   HYDROCHLOROTHIAZIDE (HYDRODIURIL) 25 MG TABLET    Take 1 tablet (25 mg total) by mouth daily.   MIRTAZAPINE (REMERON) 15 MG TABLET    Take 1 tablet (15 mg total) by mouth at bedtime.   MULTIPLE VITAMINS-MINERALS (ICAPS AREDS 2) CAPS    Take 2 capsules by mouth daily.   OMEPRAZOLE (PRILOSEC) 40 MG CAPSULE    Take 1 capsule (40 mg total) by mouth daily.   ONDANSETRON (ZOFRAN) 4 MG TABLET    Take 4 mg by mouth daily as needed for nausea or vomiting.  Modified Medications   No medications on file  Discontinued Medications   No medications on file     Physical Exam:  Vitals:   06/04/16 0834  BP: (!) 148/82  Pulse: 77  Resp: 18  Temp: 97.6 F (36.4 C)  TempSrc: Oral  SpO2: 95%  Weight: 144 lb 12.8 oz (65.7 kg)  Height: 5\' 4"  (1.626 m)   Body mass index is 24.85 kg/m.  Physical Exam  Constitutional: She is oriented to  person, place, and time. She appears well-developed and well-nourished. No distress.  Cardiovascular: Normal rate, regular rhythm and intact distal pulses.   Murmur heard. Pulmonary/Chest: Effort normal and breath sounds normal. No respiratory distress.  Abdominal: Soft. Bowel sounds are normal. She exhibits no distension. There is no tenderness.  Musculoskeletal: Normal range of motion. She exhibits no edema or tenderness.  Neurological: She is alert and oriented to person, place, and time.  Skin: Skin is warm and dry.  Psychiatric: She has a normal mood and affect.    Labs reviewed: Basic Metabolic Panel:  Recent Labs  10/04/15 12/02/15 0802  NA 139 137  K 4.5  4.2  CL  --  101  CO2  --  25  GLUCOSE  --  91  BUN 23* 21  CREATININE 1.3* 1.25*  CALCIUM  --  9.9  TSH  --  2.93   Liver Function Tests:  Recent Labs  10/04/15 12/02/15 0802  AST 13 15  ALT 11 12  ALKPHOS 64 76  BILITOT  --  0.4  PROT  --  7.2  ALBUMIN  --  3.8   No results for input(s): LIPASE, AMYLASE in the last 8760 hours. No results for input(s): AMMONIA in the last 8760 hours. CBC:  Recent Labs  12/02/15 0802  WBC 6.1  NEUTROABS 3,477  HGB 11.7  HCT 35.7  MCV 87.7  PLT 385   Lipid Panel:  Recent Labs  10/04/15 12/02/15 0802  CHOL 216* 222*  HDL 61 61  LDLCALC 136 136*  TRIG 93 126  CHOLHDL  --  3.6   TSH:  Recent Labs  12/02/15 0802  TSH 2.93   A1C: Lab Results  Component Value Date   HGBA1C 5.4 01/15/2014     Assessment/Plan 1. Loss of weight Weight is stable, will cont current regimen as she seems to be benefiting from Remeron but weight has not increased significantly.   2. Anxiety and Depression, unspecified depression type Ongoing anxiety and depression due to back pain. May need to add another medication if continues.   3. Lumbar degenerative disc disease Injections and interventions by specialist not effective; Does not routinely take any medication.  Encouraged to add tylenol to morning medication and may take as needed  - Ambulatory referral to Physical Therapy for evaluation and treatment to see if she can benefit from this.   4. Atherosclerosis of native coronary artery of native heart without angina pectoris Stable, without chest pains. Has follow up with cardiologist next month.  - COMPLETE METABOLIC PANEL WITH GFR  5. Essential hypertension Blood pressure elevated today but has not taken medication this morning. Cont dietary compliance with medications  6. Hyperlipidemia, unspecified hyperlipidemia type LDL not at goal on last labs, statin intolerant, conts on fenofibrate  - CBC with Differential/Platelets - Lipid Panel  7. Osteoporosis without current pathological fracture, unspecified osteoporosis type Does not wish to get prolia due to cost, after discussion interested in Reclast, however will call insurance and verify cost before medication is administered  -pt with hx of severe GERD therefore unable to tolerate fosamax -cont calcium and vit D   Follow up in 3 months, sooner if needed   Cherri Yera K. Harle Battiest  [facility] [phone] 8 am - 5 pm) [phone] (after hours)

## 2016-06-05 ENCOUNTER — Other Ambulatory Visit: Payer: Self-pay

## 2016-06-05 ENCOUNTER — Telehealth: Payer: Self-pay

## 2016-06-05 DIAGNOSIS — E785 Hyperlipidemia, unspecified: Secondary | ICD-10-CM

## 2016-06-05 DIAGNOSIS — I10 Essential (primary) hypertension: Secondary | ICD-10-CM

## 2016-06-05 MED ORDER — EZETIMIBE 10 MG PO TABS: 10.0000 mg | ORAL_TABLET | Freq: Every day | ORAL | 3 refills | Status: DC

## 2016-06-05 NOTE — Telephone Encounter (Signed)
I spoke with patient's daughter, [name], today about the Reclast paperwork that needs to be filled out to initiate infusions. [name] stated that patient's insurance would cover the generic version of the medication but she needed to call the insurance company back to see how much the it will cost patient at outpatient center. [name] has asked that we hold off on filling out paperwork until she can get back in touch with me on Thursday.

## 2016-06-05 NOTE — Telephone Encounter (Signed)
Opened in error

## 2016-06-05 NOTE — Addendum Note (Signed)
Addended by: Denyse Amass on: 06/05/2016 11:07 AM   Modules accepted: Orders

## 2016-06-07 NOTE — Telephone Encounter (Signed)
Noted. Reclast forms have been shredded.

## 2016-06-07 NOTE — Telephone Encounter (Signed)
We could try the oral medication for osteoporosis however suspect she will not be able to tolerate due to acid reflux/indigestion. However hesitant to start this as it could effect appetite and oral intake.

## 2016-06-07 NOTE — Telephone Encounter (Signed)
Message left on clinical intake voicemail:   Erskine Squibb called the clinical intake line requesting to speak with Coralyn Mark.  I called [name] and she was able to call the insurance company, the out of pocket cost for the patient is 600 dollars and this includes the medication and the cost of administration. Patient would not like to consider this at this time due to cost-too expensive.

## 2016-06-08 ENCOUNTER — Other Ambulatory Visit: Payer: Self-pay

## 2016-06-08 MED ORDER — FLUTICASONE PROPIONATE 50 MCG/ACT NA SUSP: 1.0000 | Freq: Two times a day (BID) | NASAL | 3 refills | Status: DC | PRN

## 2016-07-13 ENCOUNTER — Encounter: Payer: Self-pay | Admitting: Cardiovascular Disease

## 2016-07-13 ENCOUNTER — Ambulatory Visit (INDEPENDENT_AMBULATORY_CARE_PROVIDER_SITE_OTHER): Payer: Medicare Other | Admitting: Cardiovascular Disease

## 2016-07-13 VITALS — BP 142/72 | HR 69 | Ht 64.0 in | Wt 139.6 lb

## 2016-07-13 DIAGNOSIS — I350 Nonrheumatic aortic (valve) stenosis: Secondary | ICD-10-CM | POA: Diagnosis not present

## 2016-07-13 DIAGNOSIS — R55 Syncope and collapse: Secondary | ICD-10-CM

## 2016-07-13 DIAGNOSIS — I2510 Atherosclerotic heart disease of native coronary artery without angina pectoris: Secondary | ICD-10-CM

## 2016-07-13 DIAGNOSIS — I10 Essential (primary) hypertension: Secondary | ICD-10-CM | POA: Diagnosis not present

## 2016-07-13 DIAGNOSIS — I5032 Chronic diastolic (congestive) heart failure: Secondary | ICD-10-CM

## 2016-07-13 NOTE — Progress Notes (Signed)
Cardiology Office Note    Date:  07/13/2016   ID:  Sheri, Smith 01/29/1926, MRN [id number]  PCP:  Lauree Chandler, NP  Cardiologist:   Sanda Klein, MD   Chief Complaint  Patient presents with  . Follow-up    pt reports no complaints    History of Present Illness:  [name] is a 83 y.o. female with a history of severe aortic stenosis treated in December 2015 with TAVR (Sapien3, 23 mm), but without other serious cardiac problems except hypertension. She had mild problems with diastolic heart failure that seem to have resolved completely following valve replacement. Cardiac catheterization in 2015 showed mild plaque in the left main coronary artery and proximal right coronary artery without significant obstruction.   The patient specifically denies any chest pain at rest exertion, dyspnea at rest or with exertion, orthopnea, paroxysmal nocturnal dyspnea, syncope, palpitations, focal neurological deficits, intermittent claudication, lower extremity edema, unexplained weight gain, cough, hemoptysis or wheezing.  Infrequently, she has had some unusual "spells". These happen about 3 times a year and always when she is sitting in a chair, never while lying down. They usually happen when she is quietly watching television. She feels slightly nauseous, becomes really hot and flushed and feels that she is very close to passing out. She stated that if she just sits quietly for a minute or 2 the symptoms gradually resolved. She has not had full loss of consciousness. She does not have a history of fainting.  Cardiac point of view she is doing great, but is now limited by severe low back pain. She feels well as long she is laying down or sitting down, but after standing for a few minutes or after walking she develops severe pain. She tells me she has seen 7 different providers including orthopedic surgeons, spine specialist, neurosurgeons and even an acupuncturist. Nothing has really  helped.  Returning for follow-up today. She has no cardiac complaints and specifically denies shortness of breath, chest pain, palpitations, leg edema, claudication, focal neurological deficits, syncope or dizziness. All her complaints center around severe pain in her left lower back that had abrupt onset 1 day and has persisted now for many weeks. It is generally absent when she rests but occurs immediately with walking or even standing. She has seen 5 different physicians, has had x-rays and MRI, has had 7 spinal injections, has seen a chiropractor and has had acupuncture. Nothing has helped. She appears very frustrated. Continues to live independently and drives.    Past Medical History:  Diagnosis Date  . Anxiety   . Arthritis    Both knees R>L  . Chronic diastolic congestive heart failure, NYHA class 2 ([place])   . GERD (gastroesophageal reflux disease) 05/14/2011  . Heart murmur   . Hyperlipidemia   . Hypertension   . Lumbago   . 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 Severe aortic stenosis 12/06/2013  . Shortness of breath dyspnea     Past Surgical History:  Procedure Laterality Date  . CARPAL TUNNEL RELEASE Right 10/31/2015  . CATARACT EXTRACTION Bilateral   . CHOLECYSTECTOMY  2008  . EYE SURGERY    . INTRAOPERATIVE TRANSESOPHAGEAL ECHOCARDIOGRAM N/A 01/19/2014   Procedure: INTRAOPERATIVE TRANSESOPHAGEAL ECHOCARDIOGRAM;  Surgeon: Sherren Mocha, MD;  Location: [facility] OR;  Service: Open Heart Surgery;  Laterality: N/A;  . KNEE ARTHROSCOPY Bilateral   .  LEFT AND RIGHT HEART CATHETERIZATION WITH CORONARY ANGIOGRAM N/A 12/08/2013   Procedure: LEFT AND RIGHT HEART CATHETERIZATION WITH CORONARY ANGIOGRAM;  Surgeon: Sanda Klein, MD;  Location: [facility] CATH LAB;  Service: Cardiovascular;  Laterality: N/A;  . REFRACTIVE SURGERY Bilateral   . REPLACEMENT TOTAL KNEE Right   .  TONSILLECTOMY     70 years ago  . TOTAL KNEE ARTHROPLASTY Right 04/28/2014   Procedure: TOTAL KNEE ARTHROPLASTY;  Surgeon: Kathryne Hitch, MD;  Location: [place];  Service: Orthopedics;  Laterality: Right;  . TRANSCATHETER AORTIC VALVE REPLACEMENT, TRANSFEMORAL N/A 01/19/2014   Procedure: TRANSCATHETER AORTIC VALVE REPLACEMENT, TRANSFEMORAL;  Surgeon: Sherren Mocha, MD;  Location: [place];  Service: Open Heart Surgery;  Laterality: N/A;    Current Medications: Outpatient Medications Prior to Visit  Medication Sig Dispense Refill  . amLODipine (NORVASC) 10 MG tablet Take 1 tablet (10 mg total) by mouth daily. 90 tablet 3  . Ascorbic Acid (VITAMIN C) 1000 MG tablet Take 1,000 mg by mouth 2 (two) times daily.     . carvedilol (COREG) 12.5 MG tablet Take 1 tablet (12.5 mg total) by mouth 2 (two) times daily. 180 tablet 3  . cholecalciferol (VITAMIN D) 1000 UNITS tablet Take 1,000 Units by mouth 2 (two) times daily.     Marland Kitchen ezetimibe (ZETIA) 10 MG tablet Take 1 tablet (10 mg total) by mouth daily. Use generic only 30 tablet 3  . fenofibrate 160 MG tablet Take 1 tablet (160 mg total) by mouth daily. 90 tablet 3  . fluticasone (FLONASE) 50 MCG/ACT nasal spray Place 1 spray into both nostrils 2 (two) times daily as needed (congestion). 16 g 3  . hydrochlorothiazide (HYDRODIURIL) 25 MG tablet Take 1 tablet (25 mg total) by mouth daily. 90 tablet 3  . mirtazapine (REMERON) 15 MG tablet Take 1 tablet (15 mg total) by mouth at bedtime. 90 tablet 3  . Multiple Vitamins-Minerals (ICAPS AREDS 2) CAPS Take 2 capsules by mouth daily.    Marland Kitchen omeprazole (PRILOSEC) 40 MG capsule Take 1 capsule (40 mg total) by mouth daily. 90 capsule 3  . ondansetron (ZOFRAN) 4 MG tablet Take 4 mg by mouth daily as needed for nausea or vomiting.     No facility-administered medications prior to visit.      Allergies:   Statins; Aspirin; Codeine; and Phenytoin   Social History   Social History  . Marital status: Widowed    Spouse  name: N/A  . Number of children: 3  . Years of education: N/A   Social History Main Topics  . Smoking status: Never Smoker  . Smokeless tobacco: Never Used  . Alcohol use No  . Drug use: No  . Sexual activity: No   Other Topics Concern  . None   Social History Narrative   Diet?       Do you drink/eat things with caffeine? No      Marital status?         widow                           What year were you married?      Do you live in a house, apartment, assisted living, condo, trailer, etc.?  Condo       Is it one or more stories? one      How many persons live in your home? Self      Do you have any pets in your home? (please  list) no      Current or past profession: office      Do you exercise?                no                      Type & how often?      Do you have a living will? yes      Do you have a DNR form?  yes                                If not, do you want to discuss one?      Do you have signed POA/HPOA for forms? yes        Family History:  [name] includes Breast cancer in her sister; Heart attack in her sister; Muscular dystrophy in her father; Ovarian cancer in her sister; Stroke in her mother.   ROS:   Please see the history of present illness.    ROS All other systems reviewed and are negative.   PHYSICAL EXAM:   VS:  BP (!) 142/72   Pulse 69   Ht 5\' 4"  (1.626 m)   Wt 139 lb 9.6 oz (63.3 kg)   BMI 23.96 kg/m     General: Alert, oriented x3, no distress Head: no evidence of trauma, PERRL, EOMI, no exophtalmos or lid lag, no myxedema, no xanthelasma; normal ears, nose and oropharynx Neck: normal jugular venous pulsations and no hepatojugular reflux; brisk carotid pulses without delay and no carotid bruits Chest: clear to auscultation, no signs of consolidation by percussion or palpation, normal fremitus, symmetrical and full respiratory excursions Cardiovascular: normal position and quality of the apical impulse,  regular rhythm, normal first and second heart sounds, no diastolic murmurs, rubs or gallops. Barely audible 1/6 aortic ejection murmur. Abdomen: no tenderness or distention, no masses by palpation, no abnormal pulsatility or arterial bruits, normal bowel sounds, no hepatosplenomegaly Extremities: no clubbing, cyanosis or edema; 2+ radial, ulnar and brachial pulses bilaterally; 2+ right femoral, posterior tibial and dorsalis pedis pulses; 2+ left femoral, posterior tibial and dorsalis pedis pulses; no subclavian or femoral bruits Neurological: grossly nonfocal   Wt Readings from Last 3 Encounters:  07/13/16 139 lb 9.6 oz (63.3 kg)  06/04/16 144 lb 12.8 oz (65.7 kg)  03/09/16 144 lb (65.3 kg)      Studies/Labs Reviewed:   EKG:  EKG is ordered today. It shows Normal sinus rhythm, normal tracing, QTC 411 ms  ASSESSMENT:    1. Near syncope   2. Nonrheumatic aortic valve stenosis   3. Chronic diastolic congestive heart failure, NYHA class 2 ([place])   4. Essential hypertension   5. Atherosclerosis of native coronary artery of native heart without angina pectoris      PLAN:  In order of problems listed above:  1. Near syncope: The spells that she has are vaguely reminiscent of vasovagal syncope since the associated nausea and a sensation of heat, but she does not have a history of neurally mediated syncope. She is elderly and at risk for conduction system abnormalities, but her ECG looks normal. She is on a moderately high dose of beta blockers, but her heart rate is not slow. We'll schedule a 24-hour Holter monitor. 2. AS s/p TAVR: She has done great since the aortic valve replacement. Echo in November in follow-up in a  year  3. Chronic diastolic heart failure: No signs or symptoms of heart failure since valve replacement. 4. HTN: Fair control. If her monitor shows bradycardia or pauses may have to curtail her beta blocker. 5. CAD: Minor by previous angiography. Currently  asymptomatic.Continue statin     Medication Adjustments/Labs and Tests Ordered: Current medicines are reviewed at length with the patient today.  Concerns regarding medicines are outlined above.  Medication changes, Labs and Tests ordered today are listed in the Patient Instructions below. Patient Instructions  Dr Sallyanne Kuster recommends that you continue on your current medications as directed. Please refer to the Current Medication list given to you today.  Your physician has recommended that you wear a 24-hour holter monitor. Holter monitors are medical devices that record the heart's electrical activity. Doctors most often use these monitors to diagnose arrhythmias. Arrhythmias are problems with the speed or rhythm of the heartbeat. The monitor is a small, portable device. You can wear one while you do your normal daily activities. This is usually used to diagnose what is causing palpitations/syncope (passing out). This has been ordered to be completed at our [facility] location - [address].  Dr Sallyanne Kuster recommends that you schedule a follow-up appointment in 12 months. You will receive a reminder letter in the mail two months in advance. If you don't receive a letter, please call our office to schedule the follow-up appointment.  If you need a refill on your cardiac medications before your next appointment, please call your pharmacy.    Signed, Sanda Klein, MD  07/13/2016 10:08 AM    [facility] [address], [place], [place]  [address] Phone: [phone]; Fax: [phone]

## 2016-07-13 NOTE — Patient Instructions (Signed)
Dr Sallyanne Kuster recommends that you continue on your current medications as directed. Please refer to the Current Medication list given to you today.  Your physician has recommended that you wear a 24-hour holter monitor. Holter monitors are medical devices that record the heart's electrical activity. Doctors most often use these monitors to diagnose arrhythmias. Arrhythmias are problems with the speed or rhythm of the heartbeat. The monitor is a small, portable device. You can wear one while you do your normal daily activities. This is usually used to diagnose what is causing palpitations/syncope (passing out). This has been ordered to be completed at our [facility] location - [address].  Dr Sallyanne Kuster recommends that you schedule a follow-up appointment in 12 months. You will receive a reminder letter in the mail two months in advance. If you don't receive a letter, please call our office to schedule the follow-up appointment.  If you need a refill on your cardiac medications before your next appointment, please call your pharmacy.

## 2016-07-18 ENCOUNTER — Other Ambulatory Visit: Payer: Self-pay | Admitting: *Deleted

## 2016-07-18 MED ORDER — FENOFIBRATE 160 MG PO TABS: 160.0000 mg | ORAL_TABLET | Freq: Every day | ORAL | 0 refills | Status: DC

## 2016-07-18 NOTE — Telephone Encounter (Signed)
Patient requested Refill

## 2016-07-30 ENCOUNTER — Ambulatory Visit (INDEPENDENT_AMBULATORY_CARE_PROVIDER_SITE_OTHER): Payer: Medicare Other

## 2016-07-30 DIAGNOSIS — R55 Syncope and collapse: Secondary | ICD-10-CM

## 2016-08-08 ENCOUNTER — Telehealth: Payer: Self-pay | Admitting: *Deleted

## 2016-08-08 ENCOUNTER — Encounter: Payer: Self-pay | Admitting: Nurse Practitioner

## 2016-08-08 ENCOUNTER — Ambulatory Visit (INDEPENDENT_AMBULATORY_CARE_PROVIDER_SITE_OTHER): Payer: Medicare Other | Admitting: Nurse Practitioner

## 2016-08-08 VITALS — BP 144/86 | HR 78 | Temp 98.3°F | Resp 18 | Ht 64.0 in | Wt 141.6 lb

## 2016-08-08 DIAGNOSIS — K219 Gastro-esophageal reflux disease without esophagitis: Secondary | ICD-10-CM

## 2016-08-08 DIAGNOSIS — N3281 Overactive bladder: Secondary | ICD-10-CM

## 2016-08-08 DIAGNOSIS — F419 Anxiety disorder, unspecified: Secondary | ICD-10-CM

## 2016-08-08 DIAGNOSIS — R58 Hemorrhage, not elsewhere classified: Secondary | ICD-10-CM

## 2016-08-08 DIAGNOSIS — F329 Major depressive disorder, single episode, unspecified: Secondary | ICD-10-CM | POA: Diagnosis not present

## 2016-08-08 DIAGNOSIS — M5136 Other intervertebral disc degeneration, lumbar region: Secondary | ICD-10-CM | POA: Diagnosis not present

## 2016-08-08 LAB — CBC WITH DIFFERENTIAL/PLATELET
BASOS ABS: 66 {cells}/uL (ref 0–200)
BASOS PCT: 1 %
EOS ABS: 198 {cells}/uL (ref 15–500)
Eosinophils Relative: 3 %
HEMATOCRIT: 36.5 % (ref 35.0–45.0)
HEMOGLOBIN: 11.7 g/dL (ref 11.7–15.5)
LYMPHS ABS: 1980 {cells}/uL (ref 850–3900)
Lymphocytes Relative: 30 %
MCH: 28.5 pg (ref 27.0–33.0)
MCHC: 32.1 g/dL (ref 32.0–36.0)
MCV: 89 fL (ref 80.0–100.0)
MONO ABS: 660 {cells}/uL (ref 200–950)
MONOS PCT: 10 %
MPV: 10.4 fL (ref 7.5–12.5)
Neutro Abs: 3696 cells/uL (ref 1500–7800)
Neutrophils Relative %: 56 %
Platelets: 477 10*3/uL — ABNORMAL HIGH (ref 140–400)
RBC: 4.1 MIL/uL (ref 3.80–5.10)
RDW: 14.2 % (ref 11.0–15.0)
WBC: 6.6 10*3/uL (ref 3.8–10.8)

## 2016-08-08 MED ORDER — SOLIFENACIN SUCCINATE 5 MG PO TABS: 5.0000 mg | ORAL_TABLET | Freq: Every day | ORAL | 0 refills | Status: DC

## 2016-08-08 MED ORDER — TRAMADOL HCL 50 MG PO TABS: 50.0000 mg | ORAL_TABLET | Freq: Four times a day (QID) | ORAL | 0 refills | Status: DC | PRN

## 2016-08-08 MED ORDER — MIRABEGRON ER 25 MG PO TB24: 25.0000 mg | ORAL_TABLET | Freq: Every day | ORAL | 1 refills | Status: DC

## 2016-08-08 NOTE — Patient Instructions (Signed)
To use tramadol as needed pain.  Start with 1 tablet may increase to 2 if needed.  MAX of 6 tablets in 24 hours  Will start myrbetriq 25 mg daily for overactive bladder

## 2016-08-08 NOTE — Progress Notes (Signed)
Careteam: Patient Care Team: Lauree Chandler, NP as PCP - General (Geriatric Medicine) Milus Banister, MD as Attending Physician (Gastroenterology) Alphonsa Overall, MD as Consulting Physician (General Surgery) Sherren Mocha, MD as Consulting Physician (Cardiology)  Advanced Directive information Does Patient Have a Medical Advance Directive?: Yes, Type of Advance Directive: [place];Living will  Allergies  Allergen Reactions  . Statins     pain  . Aspirin Nausea And Vomiting  . Codeine Nausea And Vomiting  . Phenytoin Nausea And Vomiting    Chief Complaint  Patient presents with  . Acute Visit    Pt is having severe lower left back pain x 1 week. Pt diagnosed with stenosis but not surgical candidate so requesting pain medications.   . Other    Daughter in room     HPI: Patient is a 83 y.o. female seen in the office today due to severe back pain.  Back pain since sept of 2016.  Multiple spinal injections  She also had 8 accupuncture treatments and back still hurts.  Had cancer screening, MRIs and no clear reason found for back pain.  She does a little exercise on the stationary bike and the exercises from PT. Left SI joint.  Walks with limp and cane.  If stands for a long time, it goes all the way across, she must sit due to pain.  No radiculopathy. It just hurts.  Dr. Hal Neer, Dr. Lucia Gaskins, Dr. Edythe Lynn, Dr. Ace Gins, Dr Percell Miller, Dr Nelva Bush all seen and "can do nothing for her back".  Dr. Ace Gins did the MRI stated she had spinal stenosis. Dr Hal Neer did CT scan and said nothing that was operable. Dr. Nelva Bush also had done an xray.   Was trying to get an appt with Dr Durene Cal however can not get appt with that practice Pt is in so much pain she can not get out of the chair.  Taking aleve and tylenol now but this is not helping. Can not live with this pain at this time.  Can not sleep. Get up and fix food to eat. Getting up on her feet the pressure is unbearable.    Last PT was about a year ago.  Daughter suggested her come to stay with her so she can help her with food and ADLs but she does not want to.   Also reports she is taking zofran daily for indigestion  Daughter bough her ranitidine but shes not taking it  Review of Systems:  Review of Systems  Constitutional: Negative for chills, fever and malaise/fatigue.       Weight gain  HENT: Negative for congestion.   Eyes:       Glasses  Respiratory: Negative for shortness of breath.   Cardiovascular: Negative for chest pain, palpitations and leg swelling.  Gastrointestinal: Negative for abdominal pain.  Genitourinary: Positive for frequency. Negative for dysuria, flank pain, hematuria and urgency.       Getting up multiple times at night to urinate  Musculoskeletal: Positive for back pain and myalgias. Negative for falls.  Neurological: Negative for dizziness and weakness.  Endo/Heme/Allergies: Bruises/bleeds easily.  Psychiatric/Behavioral: Negative for depression and memory loss. The patient has insomnia.     Past Medical History:  Diagnosis Date  . Anxiety   . Arthritis    Both knees R>L  . Chronic diastolic congestive heart failure, NYHA class 2 ([place])   . GERD (gastroesophageal reflux disease) 05/14/2011  . Heart murmur   . Hyperlipidemia   .  Hypertension   . Lumbago   . 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 Severe aortic stenosis 12/06/2013  . Shortness of breath dyspnea    Past Surgical History:  Procedure Laterality Date  . CARPAL TUNNEL RELEASE Right 10/31/2015  . CATARACT EXTRACTION Bilateral   . CHOLECYSTECTOMY  2008  . EYE SURGERY    . INTRAOPERATIVE TRANSESOPHAGEAL ECHOCARDIOGRAM N/A 01/19/2014   Procedure: INTRAOPERATIVE TRANSESOPHAGEAL ECHOCARDIOGRAM;  Surgeon: Sherren Mocha, MD;  Location: [facility] OR;  Service: Open Heart Surgery;  Laterality: N/A;  .  KNEE ARTHROSCOPY Bilateral   . LEFT AND RIGHT HEART CATHETERIZATION WITH CORONARY ANGIOGRAM N/A 12/08/2013   Procedure: LEFT AND RIGHT HEART CATHETERIZATION WITH CORONARY ANGIOGRAM;  Surgeon: Sanda Klein, MD;  Location: [facility] CATH LAB;  Service: Cardiovascular;  Laterality: N/A;  . REFRACTIVE SURGERY Bilateral   . REPLACEMENT TOTAL KNEE Right   . TONSILLECTOMY     70 years ago  . TOTAL KNEE ARTHROPLASTY Right 04/28/2014   Procedure: TOTAL KNEE ARTHROPLASTY;  Surgeon: Kathryne Hitch, MD;  Location: [facility];  Service: Orthopedics;  Laterality: Right;  . TRANSCATHETER AORTIC VALVE REPLACEMENT, TRANSFEMORAL N/A 01/19/2014   Procedure: TRANSCATHETER AORTIC VALVE REPLACEMENT, TRANSFEMORAL;  Surgeon: Sherren Mocha, MD;  Location: [facility];  Service: Open Heart Surgery;  Laterality: N/A;   Social History:   reports that she has never smoked. She has never used smokeless tobacco. She reports that she does not drink alcohol or use drugs.  Family History  Problem Relation Age of Onset  . Muscular dystrophy Father        Died at 5  . Stroke Mother   . Ovarian cancer Sister   . Breast cancer Sister   . Heart attack Sister        Died at 54  . Colon cancer Neg Hx   . Hypertension Neg Hx     Medications: Patient's Medications  New Prescriptions   No medications on file  Previous Medications   AMLODIPINE (NORVASC) 10 MG TABLET    Take 1 tablet (10 mg total) by mouth daily.   ASCORBIC ACID (VITAMIN C) 1000 MG TABLET    Take 1,000 mg by mouth 2 (two) times daily.    CARVEDILOL (COREG) 12.5 MG TABLET    Take 1 tablet (12.5 mg total) by mouth 2 (two) times daily.   CHOLECALCIFEROL (VITAMIN D) 1000 UNITS TABLET    Take 1,000 Units by mouth 2 (two) times daily.    EZETIMIBE (ZETIA) 10 MG TABLET    Take 1 tablet (10 mg total) by mouth daily. Use generic only   FENOFIBRATE 160 MG TABLET    Take 1 tablet (160 mg total) by mouth daily.   FLUTICASONE (FLONASE) 50 MCG/ACT NASAL SPRAY    Place 1 spray into both  nostrils 2 (two) times daily as needed (congestion).   HYDROCHLOROTHIAZIDE (HYDRODIURIL) 25 MG TABLET    Take 1 tablet (25 mg total) by mouth daily.   MIRTAZAPINE (REMERON) 15 MG TABLET    Take 1 tablet (15 mg total) by mouth at bedtime.   MULTIPLE VITAMINS-MINERALS (ICAPS AREDS 2) CAPS    Take 2 capsules by mouth daily.   OMEPRAZOLE (PRILOSEC) 40 MG CAPSULE    Take 1 capsule (40 mg total) by mouth daily.   ONDANSETRON (ZOFRAN) 4 MG TABLET    Take 4 mg by mouth daily as needed for nausea or vomiting.  Modified Medications   No medications on file  Discontinued Medications   No medications on file     Physical Exam:  Vitals:   08/08/16 1039  BP: (!) 144/86  Pulse: 78  Resp: 18  Temp: 98.3 F (36.8 C)  TempSrc: Oral  SpO2: 96%  Weight: 141 lb 9.6 oz (64.2 kg)  Height: 5\' 4"  (1.626 m)   Body mass index is 24.31 kg/m.  Physical Exam  Constitutional: She is oriented to person, place, and time. She appears well-developed and well-nourished. No distress.  Cardiovascular: Normal rate, regular rhythm and intact distal pulses.   Murmur heard. Pulmonary/Chest: Effort normal and breath sounds normal. No respiratory distress.  Abdominal: Soft. Bowel sounds are normal. She exhibits no distension. There is no tenderness.  Musculoskeletal: Normal range of motion. She exhibits no edema or tenderness.  Cautious gait  Neurological: She is alert and oriented to person, place, and time. She displays normal reflexes. No cranial nerve deficit or sensory deficit. She exhibits normal muscle tone. Coordination normal.  Skin: Skin is warm and dry. Ecchymosis (scattered ecchymosis) noted.  Psychiatric: She has a normal mood and affect.   Labs reviewed: Basic Metabolic Panel:  Recent Labs  10/04/15 12/02/15 0802 06/04/16 0933  NA 139 137 140  K 4.5 4.2 4.4  CL  --  101 104  CO2  --  25 27  GLUCOSE  --  91 85  BUN 23* 21 27*  CREATININE 1.3* 1.25* 1.26*  CALCIUM  --  9.9 10.1  TSH  --   2.93  --    Liver Function Tests:  Recent Labs  10/04/15 12/02/15 0802 06/04/16 0933  AST 13 15 16   ALT 11 12 10   ALKPHOS 64 76 50  BILITOT  --  0.4 0.5  PROT  --  7.2 7.6  ALBUMIN  --  3.8 4.2   No results for input(s): LIPASE, AMYLASE in the last 8760 hours. No results for input(s): AMMONIA in the last 8760 hours. CBC:  Recent Labs  12/02/15 0802 06/04/16 0933  WBC 6.1 6.4  NEUTROABS 3,477 3,584  HGB 11.7 11.9  HCT 35.7 36.7  MCV 87.7 89.3  PLT 385 395   Lipid Panel:  Recent Labs  10/04/15 12/02/15 0802 06/04/16 0933  CHOL 216* 222* 234*  HDL 61 61 59  LDLCALC 136 136* 151*  TRIG 93 126 122  CHOLHDL  --  3.6 4.0   TSH:  Recent Labs  12/02/15 0802  TSH 2.93   A1C: Lab Results  Component Value Date   HGBA1C 5.4 01/15/2014     Assessment/Plan 1. Lumbar degenerative disc disease Acute on chronic pain. - traMADol (ULTRAM) 50 MG tablet; Take 1-2 tablets (50-100 mg total) by mouth every 6 (six) hours as needed for severe pain (not to exceed 6 tablets/24 hours).  Dispense: 120 tablet; Refill: 0 -pt with codeine intolerance (nausea) and discuss possibly of nausea occurring with medication. To start with 1/2 tablet.  -will get [facility] PT to evaluate and treat. Also for evaluation of assistive device. (walker vs cane)   2. OAB (overactive bladder) Worse within the last few months. Getting up multiple times at night.  - mirabegron ER (MYRBETRIQ) 25 MG TB24 tablet; Take 1 tablet (25 mg total) by mouth daily.  Dispense: 30 tablet; Refill: 1  3. Ecchymosis -during exam pt attempted to step off elevated exam table and I grabbed pt around her waist to prevent her from falling. Within the time of the  exam a circular bruise appeared on her right forearm arm. Pt denies pain; daughter reports if she even touches her a bruise will appear and has multiple other areas on left arm  -not on anticoagulant or ASA  - CBC with Differential/Platelets to evaluate platelets.   4.  Gastroesophageal reflux disease, esophagitis presence not specified -stop ZOFRAN and to only use for nausea -to use ranitidine daily along with omeprazole.  -may need to change omeprazole of symptoms cont.   5. Anxiety and depression Ongoing, worse with back pain. Consider adding cymbalta if not able to tolerate tramadol due to side effect which may benefit pain and anxiety/depression   To keep follow up for 1 month  Juliauna Stueve K. Harle Battiest  [facility] [phone] 8 am - 5 pm) [phone] (after hours)

## 2016-08-08 NOTE — Telephone Encounter (Signed)
Could try vesicare 5 mg daily, please remove myrbetriq

## 2016-08-08 NOTE — Telephone Encounter (Signed)
[name], daughter called and stated that the Myrbetriq you prescribed costs too much $200 and she cannot afford this. Not going to fill. Please Advise.

## 2016-08-08 NOTE — Telephone Encounter (Signed)
I called [name] to let her know that we would not have any samples of myrbetriq until 08/13/16 and that I would call her once I had the samples in hand.   She asked if there was a less expensive alternative to myrbetriq?  Please advise.

## 2016-08-08 NOTE — Addendum Note (Signed)
Addended by: Denyse Amass on: 08/08/2016 04:11 PM   Modules accepted: Orders

## 2016-08-14 ENCOUNTER — Telehealth: Payer: Self-pay | Admitting: *Deleted

## 2016-08-14 ENCOUNTER — Other Ambulatory Visit: Payer: Self-pay | Admitting: Nurse Practitioner

## 2016-08-14 ENCOUNTER — Encounter: Payer: Self-pay | Admitting: Nurse Practitioner

## 2016-08-14 DIAGNOSIS — Z952 Presence of prosthetic heart valve: Secondary | ICD-10-CM | POA: Diagnosis not present

## 2016-08-14 DIAGNOSIS — M51369 Other intervertebral disc degeneration, lumbar region without mention of lumbar back pain or lower extremity pain: Secondary | ICD-10-CM | POA: Insufficient documentation

## 2016-08-14 DIAGNOSIS — M170 Bilateral primary osteoarthritis of knee: Secondary | ICD-10-CM | POA: Diagnosis not present

## 2016-08-14 DIAGNOSIS — I110 Hypertensive heart disease with heart failure: Secondary | ICD-10-CM | POA: Diagnosis not present

## 2016-08-14 DIAGNOSIS — F329 Major depressive disorder, single episode, unspecified: Secondary | ICD-10-CM | POA: Diagnosis not present

## 2016-08-14 DIAGNOSIS — M4807 Spinal stenosis, lumbosacral region: Secondary | ICD-10-CM | POA: Insufficient documentation

## 2016-08-14 DIAGNOSIS — I350 Nonrheumatic aortic (valve) stenosis: Secondary | ICD-10-CM | POA: Diagnosis not present

## 2016-08-14 DIAGNOSIS — K219 Gastro-esophageal reflux disease without esophagitis: Secondary | ICD-10-CM | POA: Diagnosis not present

## 2016-08-14 DIAGNOSIS — F419 Anxiety disorder, unspecified: Secondary | ICD-10-CM | POA: Diagnosis not present

## 2016-08-14 DIAGNOSIS — M549 Dorsalgia, unspecified: Secondary | ICD-10-CM

## 2016-08-14 DIAGNOSIS — M47817 Spondylosis without myelopathy or radiculopathy, lumbosacral region: Secondary | ICD-10-CM | POA: Insufficient documentation

## 2016-08-14 DIAGNOSIS — M5136 Other intervertebral disc degeneration, lumbar region: Secondary | ICD-10-CM | POA: Insufficient documentation

## 2016-08-14 DIAGNOSIS — M47816 Spondylosis without myelopathy or radiculopathy, lumbar region: Secondary | ICD-10-CM | POA: Insufficient documentation

## 2016-08-14 DIAGNOSIS — I5032 Chronic diastolic (congestive) heart failure: Secondary | ICD-10-CM | POA: Diagnosis not present

## 2016-08-14 MED ORDER — MIRABEGRON ER 25 MG PO TB24: 25.0000 mg | ORAL_TABLET | Freq: Every day | ORAL | 0 refills | Status: DC

## 2016-08-14 MED ORDER — OXYMORPHONE HCL 5 MG PO TABS: 5.0000 mg | ORAL_TABLET | Freq: Four times a day (QID) | ORAL | 0 refills | Status: DC | PRN

## 2016-08-14 NOTE — Telephone Encounter (Signed)
Cindy with [facility] called and requested verbal orders for PT Start Date to start today. Verbal order given.

## 2016-08-14 NOTE — Telephone Encounter (Signed)
I spoke with [name] and she verbalized understanding. Controlled substance contract printed and placed with Rx for signing. [name] will sign contract for patient (she is POA).

## 2016-08-14 NOTE — Telephone Encounter (Signed)
[name] came by office to pick up Myrbetriq samples and while here she stated that patient was not getting any relief from tramadol.   Jessica instructed to tell patient to take 2 tablets instead of one.   [name] stated that patient taking 2 tablets and pain is still unbearable.

## 2016-08-14 NOTE — Progress Notes (Signed)
Discussed with Dr Eulas Post, patients with severe pain due to DDD/spinal stenosis. Tramadol 50 mg 2 tablets not effective in relieving pain.  Will try opana 5 mg by mouth every 6 hours as needed for pain.  May have n/v due to codeine intolerance.

## 2016-08-14 NOTE — Telephone Encounter (Signed)
Patient daughter, [name] called and stated that the Tramadol prescribed is not helping with patient's back pain. She is taking as directed with no relief. Please Advise  Daughter also inquired about samples of Myrebetriz 25mg  that were suppose to come in today. Stated that Coralyn Mark was going to leave her some samples. Samples up front. Spoke with Coralyn Mark and confirmed. Updated medication list.

## 2016-08-14 NOTE — Addendum Note (Signed)
Addended by: Rafael Bihari A on: 08/14/2016 01:03 PM   Modules accepted: Orders

## 2016-08-14 NOTE — Telephone Encounter (Signed)
Discussed with Dr Eulas Post, patients with severe pain due to DDD/spinal stenosis. Tramadol 50 mg 2 tablets not effective in relieving pain.  Will try opana 5 mg by mouth every 6 hours as needed for pain.  May have n/v due to codeine intolerance.  She will need to pick up Rx from desk and sign pain contract.

## 2016-08-15 ENCOUNTER — Telehealth: Payer: Self-pay

## 2016-08-15 ENCOUNTER — Other Ambulatory Visit: Payer: Self-pay | Admitting: Nurse Practitioner

## 2016-08-15 MED ORDER — PREDNISONE 10 MG (21) PO TBPK: ORAL_TABLET | ORAL | 0 refills | Status: DC

## 2016-08-15 MED ORDER — CYCLOBENZAPRINE HCL 5 MG PO TABS: 5.0000 mg | ORAL_TABLET | Freq: Two times a day (BID) | ORAL | 1 refills | Status: DC | PRN

## 2016-08-15 NOTE — Telephone Encounter (Signed)
Lets try prednisone taper and a low dose muscle relaxer to see if there is any benefit, hopefully will avoid side effect with this too. Rx sent to pharmacy

## 2016-08-15 NOTE — Telephone Encounter (Signed)
Charisse, from [facility] called for verbal orders for PT two times a week for 4 weeks. Gave ok for this.

## 2016-08-15 NOTE — Telephone Encounter (Signed)
I talked to [name] and she stated that she was not able to get Opana Rx for patient due to cost of over $90. She asked if there was a similar pain medication that could be called in that has a generic that might cost less.    Please advise.

## 2016-08-15 NOTE — Telephone Encounter (Signed)
I spoke with [name] and she agreed to have pt try prednisone/muscle relaxer combo. She will update office if this does not work.

## 2016-08-17 ENCOUNTER — Telehealth: Payer: Self-pay

## 2016-08-17 NOTE — Telephone Encounter (Signed)
Shrice with [facility] called to inform Janett Billow of multiple concerns (follow-up with patient's daughter)  1.) Patient was prescribed prednisone and started taking it on Wednesday. Patient feeling nervous, trouble falling and staying asleep since taking medication, please advise   2.) Myrbetriq had an opposite effect on patient, patient with increased urination since taking medication. Patient did not take medication today and symptoms improved, please advise   3.) Mulscle relaxer (cyclobenzaprine) is not helping. Patient with pain in left hip and left lower lumbar area 10/10  Please advise

## 2016-08-20 NOTE — Telephone Encounter (Signed)
Spoke with patient's daughter [name], patient with normal sleeping habits, stopped myrbetriq and back to normal routine of waking up about 3 times nightly for urination, cyclobenzaprine is not helping.   Patient will see Janett Billow tomorrow at 8:30am, patient's daughter requested the earliest appointment available

## 2016-08-20 NOTE — Telephone Encounter (Signed)
How is she doing today? Side effect of prednisone is the nervousness and trouble sleeping. Has she stuck with the medication or did she stop it It may take several days to see effects   Lets have her come in and test her for a UTI due to the increase in urinary symptoms

## 2016-08-21 ENCOUNTER — Other Ambulatory Visit: Payer: Self-pay

## 2016-08-21 ENCOUNTER — Ambulatory Visit (INDEPENDENT_AMBULATORY_CARE_PROVIDER_SITE_OTHER): Payer: Medicare Other | Admitting: Nurse Practitioner

## 2016-08-21 ENCOUNTER — Encounter: Payer: Self-pay | Admitting: Nurse Practitioner

## 2016-08-21 VITALS — BP 136/84 | HR 75 | Temp 98.1°F | Resp 18 | Ht 64.0 in | Wt 139.0 lb

## 2016-08-21 DIAGNOSIS — N3281 Overactive bladder: Secondary | ICD-10-CM | POA: Diagnosis not present

## 2016-08-21 DIAGNOSIS — R350 Frequency of micturition: Secondary | ICD-10-CM

## 2016-08-21 DIAGNOSIS — M5136 Other intervertebral disc degeneration, lumbar region: Secondary | ICD-10-CM | POA: Diagnosis not present

## 2016-08-21 LAB — POCT URINALYSIS DIPSTICK
Bilirubin, UA: NEGATIVE
Blood, UA: NEGATIVE
Glucose, UA: NEGATIVE
Ketones, UA: NEGATIVE
Nitrite, UA: NEGATIVE
PH UA: 7 (ref 5.0–8.0)
PROTEIN UA: NEGATIVE
Spec Grav, UA: 1.01 (ref 1.010–1.025)
UROBILINOGEN UA: 0.2 U/dL

## 2016-08-21 MED ORDER — GABAPENTIN 100 MG PO CAPS: 100.0000 mg | ORAL_CAPSULE | Freq: Three times a day (TID) | ORAL | 3 refills | Status: DC

## 2016-08-21 NOTE — Addendum Note (Signed)
Addended by: Denyse Amass on: 08/21/2016 09:43 AM   Modules accepted: Orders

## 2016-08-21 NOTE — Progress Notes (Signed)
Careteam: Patient Care Team: Lauree Chandler, NP as PCP - General (Geriatric Medicine) Milus Banister, MD as Attending Physician (Gastroenterology) Alphonsa Overall, MD as Consulting Physician (General Surgery) Sherren Mocha, MD as Consulting Physician (Cardiology) Suella Broad, MD as Consulting Physician (Physical Medicine and Rehabilitation) Dalton-Bethea, Fabio Asa, MD as Consulting Physician (Physical Medicine and Rehabilitation)  Advanced Directive information Does Patient Have a Medical Advance Directive?: Yes, Type of Advance Directive: [place];Living will  Allergies  Allergen Reactions  . Statins     pain  . Aspirin Nausea And Vomiting  . Codeine Nausea And Vomiting  . Phenytoin Nausea And Vomiting    Chief Complaint  Patient presents with  . Acute Visit    Pt is being seen due to muscle relaxers and prednisone not working for back pain. Pt has stopped myrbetriq after taking only 1 dose due to increased urination during night.   . Other    Daughter, [name], in room. Wants to discuss adding gabapentin along with muscle relaxer and tramadol for pain.     HPI: Patient is a 83 y.o. female seen in the office today to follow up on back pain. Pt with DDD, spinal stenosis. There is no radiculopathy. It just hurts. Dr. Hal Neer, Dr. Lucia Gaskins, Dr. Edythe Lynn, Dr. Ace Gins, Dr Percell Miller, Dr Nelva Bush all seen and "can do nothing for her back". Dr. Ace Gins did the MRI stated she had spinal stenosis. Dr Hal Neer did CT scan and said nothing that was operable. Dr. Nelva Bush also had done an xray.  Was trying to get an appt with Dr Durene Cal however can not get appt with that practice.  She was maintaining on tylenol but this was not effective and she was in 10/10 pain. Tramadol was attempted- she has intolerance to codeine. Some nausea with tramadol but pain did not improve so she quit taking it.  In the morning feels drawn over can not even straighten up.  Can not lay on her  back due to pain. Rx given for Opana but she could not afford to get medication. Prednisone taper and muscle relaxer given without benefit.  Has tried heat but no benefit Friend takes gabapentin for her back daughter would like to try this  Also took myrbetriq but this caused increase in urination so she stopped medication.    Review of Systems:  Review of Systems  Constitutional: Negative for chills, fever and malaise/fatigue.       Weight gain  HENT: Negative for congestion.   Eyes:       Glasses  Respiratory: Negative for shortness of breath.   Cardiovascular: Negative for chest pain, palpitations and leg swelling.  Gastrointestinal: Negative for abdominal pain.  Genitourinary: Positive for frequency. Negative for dysuria, flank pain, hematuria and urgency.       Getting up multiple times at night to urinate  Musculoskeletal: Positive for back pain and myalgias. Negative for falls.  Neurological: Negative for dizziness and weakness.  Endo/Heme/Allergies: Bruises/bleeds easily.  Psychiatric/Behavioral: Negative for depression and memory loss. The patient has insomnia.     Past Medical History:  Diagnosis Date  . Anxiety   . Arthritis    Both knees R>L  . Central stenosis of spinal canal    moderate L4/5 with L4/5 and L5/S1 disc bulges  . Chronic diastolic congestive heart failure, NYHA class 2 ([place])   . Facet joint syndrome (HCC)    Left L2-3, L3-4, L4-5, L5-S1  . GERD (gastroesophageal reflux disease) 05/14/2011  .  Heart murmur   . Hyperlipidemia   . Hypertension   . Left low back pain    without sacroiliac joint componet  . Lumbago   . Lumbosacral spondylosis without myelopathy   . 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 Severe aortic stenosis 12/06/2013  . Shortness of breath dyspnea   . Spinal stenosis, lumbosacral region    Past Surgical  History:  Procedure Laterality Date  . CARPAL TUNNEL RELEASE Right 10/31/2015  . CATARACT EXTRACTION Bilateral   . CHOLECYSTECTOMY  2008  . EYE SURGERY    . INTRAOPERATIVE TRANSESOPHAGEAL ECHOCARDIOGRAM N/A 01/19/2014   Procedure: INTRAOPERATIVE TRANSESOPHAGEAL ECHOCARDIOGRAM;  Surgeon: Sherren Mocha, MD;  Location: [facility] OR;  Service: Open Heart Surgery;  Laterality: N/A;  . KNEE ARTHROSCOPY Bilateral   . LEFT AND RIGHT HEART CATHETERIZATION WITH CORONARY ANGIOGRAM N/A 12/08/2013   Procedure: LEFT AND RIGHT HEART CATHETERIZATION WITH CORONARY ANGIOGRAM;  Surgeon: Sanda Klein, MD;  Location: [place] CATH LAB;  Service: Cardiovascular;  Laterality: N/A;  . REFRACTIVE SURGERY Bilateral   . REPLACEMENT TOTAL KNEE Right   . TONSILLECTOMY     70 years ago  . TOTAL KNEE ARTHROPLASTY Right 04/28/2014   Procedure: TOTAL KNEE ARTHROPLASTY;  Surgeon: Kathryne Hitch, MD;  Location: [place];  Service: Orthopedics;  Laterality: Right;  . TRANSCATHETER AORTIC VALVE REPLACEMENT, TRANSFEMORAL N/A 01/19/2014   Procedure: TRANSCATHETER AORTIC VALVE REPLACEMENT, TRANSFEMORAL;  Surgeon: Sherren Mocha, MD;  Location: [place];  Service: Open Heart Surgery;  Laterality: N/A;   Social History:   reports that she has never smoked. She has never used smokeless tobacco. She reports that she does not drink alcohol or use drugs.  Family History  Problem Relation Age of Onset  . Muscular dystrophy Father        Died at 1  . Stroke Mother   . Ovarian cancer Sister   . Breast cancer Sister   . Heart attack Sister        Died at 14  . Colon cancer Neg Hx   . Hypertension Neg Hx     Medications: Patient's Medications  New Prescriptions   No medications on file  Previous Medications   AMLODIPINE (NORVASC) 10 MG TABLET    Take 1 tablet (10 mg total) by mouth daily.   CARVEDILOL (COREG) 12.5 MG TABLET    Take 1 tablet (12.5 mg total) by mouth 2 (two) times daily.   CHOLECALCIFEROL (VITAMIN D) 1000 UNITS TABLET    Take  1,000 Units by mouth 2 (two) times daily.    CYCLOBENZAPRINE (FLEXERIL) 5 MG TABLET    Take 1 tablet (5 mg total) by mouth 2 (two) times daily as needed for muscle spasms.   EZETIMIBE (ZETIA) 10 MG TABLET    Take 1 tablet (10 mg total) by mouth daily. Use generic only   FENOFIBRATE 160 MG TABLET    Take 1 tablet (160 mg total) by mouth daily.   FLUTICASONE (FLONASE) 50 MCG/ACT NASAL SPRAY    Place 1 spray into both nostrils 2 (two) times daily as needed (congestion).   HYDROCHLOROTHIAZIDE (HYDRODIURIL) 25 MG TABLET    Take 1 tablet (25 mg total) by mouth daily.   MIRABEGRON ER (MYRBETRIQ) 25 MG TB24 TABLET    Take 1 tablet (25 mg total) by mouth daily.   MIRTAZAPINE (REMERON) 15 MG TABLET    Take 1 tablet (15 mg total)  by mouth at bedtime.   MULTIPLE VITAMINS-MINERALS (ICAPS AREDS 2) CAPS    Take 2 capsules by mouth daily.   OMEPRAZOLE (PRILOSEC) 40 MG CAPSULE    Take 1 capsule (40 mg total) by mouth daily.   ONDANSETRON (ZOFRAN) 4 MG TABLET    Take 4 mg by mouth daily as needed for nausea or vomiting.  Modified Medications   No medications on file  Discontinued Medications   ASCORBIC ACID (VITAMIN C) 1000 MG TABLET    Take 1,000 mg by mouth 2 (two) times daily.    OXYMORPHONE (OPANA) 5 MG TABLET    Take 1 tablet (5 mg total) by mouth every 6 (six) hours as needed for pain.   PREDNISONE (STERAPRED UNI-PAK 21 TAB) 10 MG (21) TBPK TABLET    Use as directed     Physical Exam:  Vitals:   08/21/16 0839  BP: 136/84  Pulse: 75  Resp: 18  Temp: 98.1 F (36.7 C)  TempSrc: Oral  SpO2: 97%  Weight: 139 lb (63 kg)  Height: 5\' 4"  (1.626 m)   Body mass index is 23.86 kg/m.  Physical Exam  Constitutional: She is oriented to person, place, and time. She appears well-developed and well-nourished. No distress.  Abdominal: Soft. She exhibits no distension. There is no tenderness.  Musculoskeletal: Normal range of motion. She exhibits tenderness (left lumbar spine). She exhibits no edema.    Cautious gait  Neurological: She is alert and oriented to person, place, and time. She displays normal reflexes. No cranial nerve deficit or sensory deficit. She exhibits normal muscle tone. Coordination normal.  Skin: Skin is warm and dry. Ecchymosis (scattered ecchymosis) noted.  Psychiatric: She has a normal mood and affect.    Labs reviewed: Basic Metabolic Panel:  Recent Labs  10/04/15 12/02/15 0802 06/04/16 0933  NA 139 137 140  K 4.5 4.2 4.4  CL  --  101 104  CO2  --  25 27  GLUCOSE  --  91 85  BUN 23* 21 27*  CREATININE 1.3* 1.25* 1.26*  CALCIUM  --  9.9 10.1  TSH  --  2.93  --    Liver Function Tests:  Recent Labs  10/04/15 12/02/15 0802 06/04/16 0933  AST 13 15 16   ALT 11 12 10   ALKPHOS 64 76 50  BILITOT  --  0.4 0.5  PROT  --  7.2 7.6  ALBUMIN  --  3.8 4.2   No results for input(s): LIPASE, AMYLASE in the last 8760 hours. No results for input(s): AMMONIA in the last 8760 hours. CBC:  Recent Labs  12/02/15 0802 06/04/16 0933 08/08/16 1142  WBC 6.1 6.4 6.6  NEUTROABS 3,477 3,584 3,696  HGB 11.7 11.9 11.7  HCT 35.7 36.7 36.5  MCV 87.7 89.3 89.0  PLT 385 395 477*   Lipid Panel:  Recent Labs  10/04/15 12/02/15 0802 06/04/16 0933  CHOL 216* 222* 234*  HDL 61 61 59  LDLCALC 136 136* 151*  TRIG 93 126 122  CHOLHDL  --  3.6 4.0   TSH:  Recent Labs  12/02/15 0802  TSH 2.93   A1C: Lab Results  Component Value Date   HGBA1C 5.4 01/15/2014     Assessment/Plan 1. Lumbar degenerative disc disease Tramadol, prednisone, zanaflex not effective. Has also tried injections, acupuncture, chiropractor, PT in the past without success.  -request gabapentin.  - gabapentin (NEURONTIN) 100 MG capsule; Take 1 capsule (100 mg total) by mouth 3 (three) times daily.  Dispense:  90 capsule; Refill: 3 - Ambulatory referral to Pain Clinic due to hard to control pain  2. OAB (overactive bladder) -stop myrbetriq  -will get UA   Follow up in 1 month.   Carlos American. Harle Battiest  [facility] [phone] 8 am - 5 pm) [phone] (after hours)

## 2016-08-24 ENCOUNTER — Other Ambulatory Visit: Payer: Self-pay | Admitting: Nurse Practitioner

## 2016-08-24 LAB — URINE CULTURE

## 2016-08-24 MED ORDER — AMOXICILLIN-POT CLAVULANATE 875-125 MG PO TABS: 1.0000 | ORAL_TABLET | Freq: Two times a day (BID) | ORAL | 0 refills | Status: DC

## 2016-09-10 ENCOUNTER — Other Ambulatory Visit: Payer: Medicare Other

## 2016-09-10 DIAGNOSIS — I10 Essential (primary) hypertension: Secondary | ICD-10-CM

## 2016-09-10 DIAGNOSIS — E785 Hyperlipidemia, unspecified: Secondary | ICD-10-CM

## 2016-09-10 LAB — COMPLETE METABOLIC PANEL WITH GFR
ALT: 16 U/L (ref 6–29)
AST: 18 U/L (ref 10–35)
Albumin: 3.8 g/dL (ref 3.6–5.1)
Alkaline Phosphatase: 60 U/L (ref 33–130)
BILIRUBIN TOTAL: 0.4 mg/dL (ref 0.2–1.2)
BUN: 27 mg/dL — AB (ref 7–25)
CHLORIDE: 103 mmol/L (ref 98–110)
CO2: 26 mmol/L (ref 20–31)
CREATININE: 1.39 mg/dL — AB (ref 0.60–0.88)
Calcium: 9.8 mg/dL (ref 8.6–10.4)
GFR, EST AFRICAN AMERICAN: 39 mL/min — AB (ref >=60)
GFR, Est Non African American: 34 mL/min — ABNORMAL LOW (ref >=60)
GLUCOSE: 82 mg/dL (ref 65–99)
Potassium: 4.4 mmol/L (ref 3.5–5.3)
Sodium: 139 mmol/L (ref 135–146)
TOTAL PROTEIN: 7 g/dL (ref 6.1–8.1)

## 2016-09-10 LAB — LIPID PANEL
Cholesterol: 211 mg/dL — ABNORMAL HIGH (ref <200)
HDL: 51 mg/dL (ref >50)
LDL CALC: 136 mg/dL — AB (ref <100)
Total CHOL/HDL Ratio: 4.1 Ratio (ref <5.0)
Triglycerides: 118 mg/dL (ref <150)
VLDL: 24 mg/dL (ref <30)

## 2016-09-17 ENCOUNTER — Ambulatory Visit (INDEPENDENT_AMBULATORY_CARE_PROVIDER_SITE_OTHER): Payer: Medicare Other | Admitting: Nurse Practitioner

## 2016-09-17 ENCOUNTER — Encounter: Payer: Self-pay | Admitting: Nurse Practitioner

## 2016-09-17 VITALS — BP 134/78 | HR 74 | Temp 97.7°F | Resp 17 | Ht 64.0 in | Wt 146.0 lb

## 2016-09-17 DIAGNOSIS — F419 Anxiety disorder, unspecified: Secondary | ICD-10-CM | POA: Diagnosis not present

## 2016-09-17 DIAGNOSIS — F32A Depression, unspecified: Secondary | ICD-10-CM

## 2016-09-17 DIAGNOSIS — E785 Hyperlipidemia, unspecified: Secondary | ICD-10-CM

## 2016-09-17 DIAGNOSIS — R634 Abnormal weight loss: Secondary | ICD-10-CM | POA: Diagnosis not present

## 2016-09-17 DIAGNOSIS — I10 Essential (primary) hypertension: Secondary | ICD-10-CM

## 2016-09-17 DIAGNOSIS — N183 Chronic kidney disease, stage 3 unspecified: Secondary | ICD-10-CM

## 2016-09-17 DIAGNOSIS — M5136 Other intervertebral disc degeneration, lumbar region: Secondary | ICD-10-CM

## 2016-09-17 DIAGNOSIS — N3281 Overactive bladder: Secondary | ICD-10-CM | POA: Diagnosis not present

## 2016-09-17 DIAGNOSIS — F329 Major depressive disorder, single episode, unspecified: Secondary | ICD-10-CM | POA: Diagnosis not present

## 2016-09-17 DIAGNOSIS — K219 Gastro-esophageal reflux disease without esophagitis: Secondary | ICD-10-CM | POA: Diagnosis not present

## 2016-09-17 MED ORDER — ZOSTER VAC RECOMB ADJUVANTED 50 MCG/0.5ML IM SUSR
0.5000 mL | Freq: Once | INTRAMUSCULAR | 1 refills | Status: AC
Start: 2016-09-17 — End: 2016-09-17

## 2016-09-17 MED ORDER — MIRABEGRON ER 25 MG PO TB24: 25.0000 mg | ORAL_TABLET | Freq: Every day | ORAL | 3 refills | Status: AC

## 2016-09-17 NOTE — Progress Notes (Signed)
Careteam: Patient Care Team: Lauree Chandler, NP as PCP - General (Geriatric Medicine) Milus Banister, MD as Attending Physician (Gastroenterology) Alphonsa Overall, MD as Consulting Physician (General Surgery) Sherren Mocha, MD as Consulting Physician (Cardiology) Suella Broad, MD as Consulting Physician (Physical Medicine and Rehabilitation) Dalton-Bethea, Fabio Asa, MD as Consulting Physician (Physical Medicine and Rehabilitation)  Advanced Directive information Does Patient Have a Medical Advance Directive?: Yes, Type of Advance Directive: [place];Living will  Allergies  Allergen Reactions  . Statins     pain  . Aspirin Nausea And Vomiting  . Codeine Nausea And Vomiting  . Phenytoin Nausea And Vomiting    Chief Complaint  Patient presents with  . Medical Management of Chronic Issues    Pt is being seen for a 3 month routine visit. Pt is still having severe back pain and is now wearing a velcro back brace to help with stability.   . Other    Daughter, [name], in room. Requesting more samples of myrbetriq for pt.      HPI: Patient is a 83 y.o. female seen in the office today for routine follow up. Pt with DDD, spinal stenosis, OAB, hyperlipidemia, htn, depression and anxiety.  Pt reported myrbetriq was causing increase urination and stopped however she is currently taking and reports it is actual helping symptoms.  Pain in back still bad. Taking the gabapentin 3 times daily.  Stopped zanaflex.  Decided not to go to pain management due to co-pay.  Pain is a 6/10, worse at times. Worse in the morning but once she gets to moving it is better. Better at night.  GERD- controlled, omeprazole helps.   Review of Systems:  Review of Systems  Constitutional: Negative for chills, fever and malaise/fatigue.       Weight gain  HENT: Negative for congestion.   Eyes:       Glasses  Respiratory: Negative for shortness of breath.   Cardiovascular: Negative  for chest pain, palpitations and leg swelling.  Gastrointestinal: Negative for abdominal pain.  Genitourinary: Positive for frequency. Negative for dysuria, flank pain, hematuria and urgency.       Getting up 1 time at night now- frequency much better   Musculoskeletal: Positive for back pain and myalgias. Negative for falls.  Neurological: Negative for dizziness and weakness.  Endo/Heme/Allergies: Bruises/bleeds easily.  Psychiatric/Behavioral: Negative for depression and memory loss. The patient does not have insomnia.     Past Medical History:  Diagnosis Date  . Anxiety   . Arthritis    Both knees R>L  . Central stenosis of spinal canal    moderate L4/5 with L4/5 and L5/S1 disc bulges  . Chronic diastolic congestive heart failure, NYHA class 2 ([place])   . Facet joint syndrome (HCC)    Left L2-3, L3-4, L4-5, L5-S1  . GERD (gastroesophageal reflux disease) 05/14/2011  . Heart murmur   . Hyperlipidemia   . Hypertension   . Left low back pain    without sacroiliac joint componet  . Lumbago   . Lumbosacral spondylosis without myelopathy   . 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 Severe aortic stenosis 12/06/2013  . Shortness of breath dyspnea   . Spinal stenosis, lumbosacral region    Past Surgical History:  Procedure Laterality Date  . CARPAL TUNNEL RELEASE Right 10/31/2015  . CATARACT EXTRACTION Bilateral   . CHOLECYSTECTOMY  2008  . EYE SURGERY    . INTRAOPERATIVE TRANSESOPHAGEAL ECHOCARDIOGRAM N/A 01/19/2014   Procedure: INTRAOPERATIVE TRANSESOPHAGEAL ECHOCARDIOGRAM;  Surgeon: Tonny Bollman, MD;  Location[facility] OR;  Service: Open Heart Surgery;  Laterality: N/A;  . KNEE ARTHROSCOPY Bilateral   . LEFT AND RIGHT HEART CATHETERIZATION WITH CORONARY ANGIOGRAM N/A 12/08/2013   Procedure: LEFT AND RIGHT HEART CATHETERIZATION WITH CORONARY ANGIOGRAM;  Surgeon: Thurmon Fair, MD;  Location: [facility] CATH LAB;  Service: Cardiovascular;  Laterality: N/A;  . REFRACTIVE SURGERY Bilateral   . REPLACEMENT TOTAL KNEE Right   . TONSILLECTOMY     70 years ago  . TOTAL KNEE ARTHROPLASTY Right 04/28/2014   Procedure: TOTAL KNEE ARTHROPLASTY;  Surgeon: Mckinley Jewel, MD;  Location: [facility] OR;  Service: Orthopedics;  Laterality: Right;  . TRANSCATHETER AORTIC VALVE REPLACEMENT, TRANSFEMORAL N/A 01/19/2014   Procedure: TRANSCATHETER AORTIC VALVE REPLACEMENT, TRANSFEMORAL;  Surgeon: Tonny Bollman, MD;  Location: [facility] OR;  Service: Open Heart Surgery;  Laterality: N/A;   Social History:   reports that she has never smoked. She has never used smokeless tobacco. She reports that she does not drink alcohol or use drugs.  Family History  Problem Relation Age of Onset  . Muscular dystrophy Father        Died at 7  . Stroke Mother   . Ovarian cancer Sister   . Breast cancer Sister   . Heart attack Sister        Died at 27  . Colon cancer Neg Hx   . Hypertension Neg Hx     Medications: Patient's Medications  New Prescriptions   No medications on file  Previous Medications   AMLODIPINE (NORVASC) 10 MG TABLET    Take 1 tablet (10 mg total) by mouth daily.   CARVEDILOL (COREG) 12.5 MG TABLET    Take 1 tablet (12.5 mg total) by mouth 2 (two) times daily.   CHOLECALCIFEROL (VITAMIN D) 1000 UNITS TABLET    Take 1,000 Units by mouth 2 (two) times daily.    EZETIMIBE (ZETIA) 10 MG TABLET    Take 1 tablet (10 mg total) by mouth daily. Use generic only   FENOFIBRATE 160 MG TABLET    Take 1 tablet (160 mg total) by mouth daily.   FLUTICASONE (FLONASE) 50 MCG/ACT NASAL SPRAY    Place 1 spray into both nostrils 2 (two) times daily as needed (congestion).   GABAPENTIN (NEURONTIN) 100 MG CAPSULE    Take 1 capsule (100 mg total) by mouth 3 (three) times daily.   HYDROCHLOROTHIAZIDE (HYDRODIURIL) 25 MG TABLET    Take 1 tablet (25 mg total) by mouth daily.   MIRABEGRON ER (MYRBETRIQ) 25 MG  TB24 TABLET    Take 1 tablet (25 mg total) by mouth daily.   MIRTAZAPINE (REMERON) 15 MG TABLET    Take 1 tablet (15 mg total) by mouth at bedtime.   MULTIPLE VITAMINS-MINERALS (ICAPS AREDS 2) CAPS    Take 2 capsules by mouth daily.   OMEPRAZOLE (PRILOSEC) 40 MG CAPSULE    Take 1 capsule (40 mg total) by mouth daily.  Modified Medications   No medications on file  Discontinued Medications   AMOXICILLIN-CLAVULANATE (AUGMENTIN) 875-125 MG TABLET    Take 1 tablet by mouth 2 (two) times daily.   CYCLOBENZAPRINE (FLEXERIL) 5 MG TABLET    Take 1 tablet (5 mg total) by mouth 2 (two) times daily as needed for muscle spasms.   ONDANSETRON (ZOFRAN) 4 MG TABLET    Take 4 mg  by mouth daily as needed for nausea or vomiting.     Physical Exam:  Vitals:   09/17/16 0840  BP: 134/78  Pulse: 74  Resp: 17  Temp: 97.7 F (36.5 C)  TempSrc: Oral  SpO2: 95%  Weight: 146 lb (66.2 kg)  Height: '5\' 4"'$  (1.626 m)   Body mass index is 25.06 kg/m.  Physical Exam  Constitutional: She is oriented to person, place, and time. She appears well-developed and well-nourished. No distress.  Eyes: Pupils are equal, round, and reactive to light.  Neck: Normal range of motion.  Cardiovascular: Normal rate, regular rhythm and normal heart sounds.   Pulmonary/Chest: Effort normal and breath sounds normal.  Abdominal: Soft. Bowel sounds are normal. She exhibits no distension. There is no tenderness.  Musculoskeletal: Normal range of motion. She exhibits tenderness (left lumbar spine). She exhibits no edema.  Cautious gait  Neurological: She is alert and oriented to person, place, and time. She displays normal reflexes. No cranial nerve deficit or sensory deficit. She exhibits normal muscle tone. Coordination normal.  Skin: Skin is warm and dry. Ecchymosis (scattered ecchymosis) noted.  Psychiatric: She has a normal mood and affect.    Labs reviewed: Basic Metabolic Panel:  Recent Labs  12/02/15 0802  06/04/16 0933 09/10/16 0827  NA 137 140 139  K 4.2 4.4 4.4  CL 101 104 103  CO2 '25 27 26  '$ GLUCOSE 91 85 82  BUN 21 27* 27*  CREATININE 1.25* 1.26* 1.39*  CALCIUM 9.9 10.1 9.8  TSH 2.93  --   --    Liver Function Tests:  Recent Labs  12/02/15 0802 06/04/16 0933 09/10/16 0827  AST '15 16 18  '$ ALT '12 10 16  '$ ALKPHOS 76 50 60  BILITOT 0.4 0.5 0.4  PROT 7.2 7.6 7.0  ALBUMIN 3.8 4.2 3.8   No results for input(s): LIPASE, AMYLASE in the last 8760 hours. No results for input(s): AMMONIA in the last 8760 hours. CBC:  Recent Labs  12/02/15 0802 06/04/16 0933 08/08/16 1142  WBC 6.1 6.4 6.6  NEUTROABS 3,477 3,584 3,696  HGB 11.7 11.9 11.7  HCT 35.7 36.7 36.5  MCV 87.7 89.3 89.0  PLT 385 395 477*   Lipid Panel:  Recent Labs  12/02/15 0802 06/04/16 0933 09/10/16 0827  CHOL 222* 234* 211*  HDL 61 59 51  LDLCALC 136* 151* 136*  TRIG 126 122 118  CHOLHDL 3.6 4.0 4.1   TSH:  Recent Labs  12/02/15 0802  TSH 2.93   A1C: Lab Results  Component Value Date   HGBA1C 5.4 01/15/2014     Assessment/Plan 1. OAB (overactive bladder) Stable, conts on myrbetriq  - mirabegron ER (MYRBETRIQ) 25 MG TB24 tablet; Take 1 tablet (25 mg total) by mouth daily.  Dispense: 30 tablet; Refill: 3  2. Lumbar degenerative disc disease Stable, gabapentin not helping. Will stop this and she can use tylenol PRN.   3. Gastroesophageal reflux disease, esophagitis presence not specified Stable, conts on omeprazole.   4. Loss of weight Has improved, conts on remeron, will cont current regimen  5. Essential hypertension -stable, will cont on norvasc coreg, and hctz  6. Hyperlipidemia, unspecified hyperlipidemia type -improved on fenofibrate and zetia. Will cont current regimen.   7. Anxiety and depression Improved on Remeron 15 mg PO qHS  8. Chronic kidney disease (CKD), stage III (moderate) -encouraged proper hydration.  - CBC with Differential/Platelets; Future - BMP with  eGFR; Future   Eian Vandervelden K. [place], [place]  [facility] [phone] 8 am - 5 pm) [phone] (after hours)

## 2016-09-17 NOTE — Patient Instructions (Addendum)
Decrease gabapentin to twice daily for 3 days then daily for 3 days then stop.   Lab work prior to next visit.

## 2016-09-27 ENCOUNTER — Telehealth: Payer: Self-pay | Admitting: Nurse Practitioner

## 2016-09-27 NOTE — Telephone Encounter (Signed)
So they told me that they decline referral, I was unaware this was still in progress.

## 2016-09-27 NOTE — Telephone Encounter (Signed)
°  FYI,  Dr. Lauris Poag office [facility]) has tried contacting daughter [name]) to schedule the requested appointment for the pain clinic with Dr. Brien Few. I have also called and left detailed messages for [name] on 09/07/16, 09/12/16 with no success. I called [name] again this morning in a final attempt to have her return their call to get the requested appointment scheduled with Dr. Brien Few for patient.

## 2016-10-06 ENCOUNTER — Other Ambulatory Visit: Payer: Self-pay | Admitting: Nurse Practitioner

## 2016-10-10 ENCOUNTER — Encounter: Payer: Self-pay | Admitting: Nurse Practitioner

## 2016-10-10 ENCOUNTER — Other Ambulatory Visit: Payer: Self-pay | Admitting: Nurse Practitioner

## 2016-10-12 ENCOUNTER — Encounter: Payer: Self-pay | Admitting: Nurse Practitioner

## 2016-10-12 ENCOUNTER — Ambulatory Visit (INDEPENDENT_AMBULATORY_CARE_PROVIDER_SITE_OTHER): Payer: Medicare Other | Admitting: Nurse Practitioner

## 2016-10-12 VITALS — BP 128/70 | HR 76 | Temp 97.8°F | Ht 64.0 in | Wt 137.0 lb

## 2016-10-12 DIAGNOSIS — K2100 Gastro-esophageal reflux disease with esophagitis, without bleeding: Secondary | ICD-10-CM

## 2016-10-12 DIAGNOSIS — R112 Nausea with vomiting, unspecified: Secondary | ICD-10-CM

## 2016-10-12 LAB — CBC WITH DIFFERENTIAL/PLATELET
Basophils Absolute: 0 cells/uL (ref 0–200)
Basophils Relative: 0 %
EOS PCT: 3 %
Eosinophils Absolute: 195 cells/uL (ref 15–500)
HCT: 35.8 % (ref 35.0–45.0)
HEMOGLOBIN: 11.7 g/dL (ref 11.7–15.5)
LYMPHS PCT: 30 %
Lymphs Abs: 1950 cells/uL (ref 850–3900)
MCH: 29.3 pg (ref 27.0–33.0)
MCHC: 32.7 g/dL (ref 32.0–36.0)
MCV: 89.7 fL (ref 80.0–100.0)
MONOS PCT: 13 %
MPV: 11.1 fL (ref 7.5–12.5)
Monocytes Absolute: 845 cells/uL (ref 200–950)
NEUTROS PCT: 54 %
Neutro Abs: 3510 cells/uL (ref 1500–7800)
Platelets: 384 10*3/uL (ref 140–400)
RBC: 3.99 MIL/uL (ref 3.80–5.10)
RDW: 13.7 % (ref 11.0–15.0)
WBC: 6.5 10*3/uL (ref 3.8–10.8)

## 2016-10-12 MED ORDER — ONDANSETRON 4 MG PO TBDP: 4.0000 mg | ORAL_TABLET | Freq: Three times a day (TID) | ORAL | 0 refills | Status: DC | PRN

## 2016-10-12 MED ORDER — OMEPRAZOLE 40 MG PO CPDR: 40.0000 mg | DELAYED_RELEASE_CAPSULE | Freq: Every day | ORAL | 3 refills | Status: DC

## 2016-10-12 NOTE — Patient Instructions (Signed)
Take omeprazole 1 tablet by mouth daily [place] diet and advance as tolerated Cont to drink plenty of fluids

## 2016-10-12 NOTE — Progress Notes (Signed)
Careteam: Patient Care Team: Lauree Chandler, NP as PCP - General (Geriatric Medicine) Milus Banister, MD as Attending Physician (Gastroenterology) Alphonsa Overall, MD as Consulting Physician (General Surgery) Sherren Mocha, MD as Consulting Physician (Cardiology) Suella Broad, MD as Consulting Physician (Physical Medicine and Rehabilitation) Dalton-Bethea, Fabio Asa, MD as Consulting Physician (Physical Medicine and Rehabilitation)  Advanced Directive information    Allergies  Allergen Reactions  . Statins     pain  . Aspirin Nausea And Vomiting  . Codeine Nausea And Vomiting  . Phenytoin Nausea And Vomiting    Chief Complaint  Patient presents with  . Acute Visit    Nausea and vomiting x 5 days, last episode of vomiting yesterday. Patient also with slight diarrhea yesterday. Patient also with indigestion.  Patient denies chills and cold sweats      HPI: Patient is a 83 y.o. female seen in the office today due to nausea and vomiting for 5 days. No abdominal pain. Mostly constipated, but every day this week she has had a regular bowel movement but yesterday morning had 1 episode of diarrhea-none since.  Unable to eat or drink anything due to indigestion with nausea and vomiting until yesterday  Has had terrible indigestion- had stopped taking omeprazole (thought she was not supposed to be taking) Ate a sandwich last night and was able to keep this down.  Able to keep pills down without throwing up immediatly but yesterday threw up 5 times  Drinking a lot of water Took omeprazole yesterday and things have been much better since she took that.  Reports she is feeling 98% better today.  Review of Systems:  Review of Systems  Constitutional: Positive for weight loss. Negative for chills, fever and malaise/fatigue.  Respiratory: Negative for shortness of breath.   Cardiovascular: Negative for chest pain and palpitations.  Gastrointestinal: Positive for heartburn,  nausea and vomiting. Negative for abdominal pain, blood in stool, constipation, diarrhea and melena.  Neurological: Positive for weakness. Negative for dizziness.    Past Medical History:  Diagnosis Date  . Anxiety   . Arthritis    Both knees R>L  . Central stenosis of spinal canal    moderate L4/5 with L4/5 and L5/S1 disc bulges  . Chronic diastolic congestive heart failure, NYHA class 2 ([place])   . Facet joint syndrome (HCC)    Left L2-3, L3-4, L4-5, L5-S1  . GERD (gastroesophageal reflux disease) 05/14/2011  . Heart murmur   . Hyperlipidemia   . Hypertension   . Left low back pain    without sacroiliac joint componet  . Lumbago   . Lumbosacral spondylosis without myelopathy   . 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 Severe aortic stenosis 12/06/2013  . Shortness of breath dyspnea   . Spinal stenosis, lumbosacral region    Past Surgical History:  Procedure Laterality Date  . CARPAL TUNNEL RELEASE Right 10/31/2015  . CATARACT EXTRACTION Bilateral   . CHOLECYSTECTOMY  2008  . EYE SURGERY    . INTRAOPERATIVE TRANSESOPHAGEAL ECHOCARDIOGRAM N/A 01/19/2014   Procedure: INTRAOPERATIVE TRANSESOPHAGEAL ECHOCARDIOGRAM;  Surgeon: Sherren Mocha, MD;  Location: [facility] OR;  Service: Open Heart Surgery;  Laterality: N/A;  . KNEE ARTHROSCOPY Bilateral   . LEFT AND RIGHT HEART CATHETERIZATION WITH CORONARY ANGIOGRAM N/A 12/08/2013   Procedure: LEFT AND RIGHT HEART CATHETERIZATION WITH CORONARY ANGIOGRAM;  Surgeon: Sanda Klein, MD;  Location: [facility] CATH  LAB;  Service: Cardiovascular;  Laterality: N/A;  . REFRACTIVE SURGERY Bilateral   . REPLACEMENT TOTAL KNEE Right   . TONSILLECTOMY     70 years ago  . TOTAL KNEE ARTHROPLASTY Right 04/28/2014   Procedure: TOTAL KNEE ARTHROPLASTY;  Surgeon: Kathryne Hitch, MD;  Location: [place];  Service: Orthopedics;  Laterality: Right;  .  TRANSCATHETER AORTIC VALVE REPLACEMENT, TRANSFEMORAL N/A 01/19/2014   Procedure: TRANSCATHETER AORTIC VALVE REPLACEMENT, TRANSFEMORAL;  Surgeon: Sherren Mocha, MD;  Location: [place];  Service: Open Heart Surgery;  Laterality: N/A;   Social History:   reports that she has never smoked. She has never used smokeless tobacco. She reports that she does not drink alcohol or use drugs.  Family History  Problem Relation Age of Onset  . Muscular dystrophy Father        Died at 51  . Stroke Mother   . Ovarian cancer Sister   . Breast cancer Sister   . Heart attack Sister        Died at 26  . Colon cancer Neg Hx   . Hypertension Neg Hx     Medications: Patient's Medications  New Prescriptions   No medications on file  Previous Medications   AMLODIPINE (NORVASC) 10 MG TABLET    Take 1 tablet (10 mg total) by mouth daily.   CARVEDILOL (COREG) 12.5 MG TABLET    Take 1 tablet (12.5 mg total) by mouth 2 (two) times daily.   CHOLECALCIFEROL (VITAMIN D) 1000 UNITS TABLET    Take 1,000 Units by mouth 2 (two) times daily.    EZETIMIBE (ZETIA) 10 MG TABLET    TAKE 1 TABLET BY MOUTH ONCE DAILY   FENOFIBRATE 160 MG TABLET    Take 1 tablet (160 mg total) by mouth daily.   FLUTICASONE (FLONASE) 50 MCG/ACT NASAL SPRAY    Place 1 spray into both nostrils 2 (two) times daily as needed (congestion).   HYDROCHLOROTHIAZIDE (HYDRODIURIL) 25 MG TABLET    Take 1 tablet (25 mg total) by mouth daily.   MIRABEGRON ER (MYRBETRIQ) 25 MG TB24 TABLET    Take 1 tablet (25 mg total) by mouth daily.   MIRTAZAPINE (REMERON) 15 MG TABLET    Take 1 tablet (15 mg total) by mouth at bedtime.   MULTIPLE VITAMINS-MINERALS (ICAPS AREDS 2) CAPS    Take 2 capsules by mouth daily.   OMEPRAZOLE (PRILOSEC) 40 MG CAPSULE    Take 1 capsule (40 mg total) by mouth daily.  Modified Medications   No medications on file  Discontinued Medications   No medications on file     Physical Exam:  Vitals:   10/12/16 0900  BP: 128/70  Pulse:  76  Temp: 97.8 F (36.6 C)  TempSrc: Oral  SpO2: 95%  Weight: 137 lb (62.1 kg)  Height: '5\' 4"'  (1.626 m)   Body mass index is 23.52 kg/m.  Physical Exam  Constitutional: She is oriented to person, place, and time. She appears well-developed and well-nourished. No distress.  Cardiovascular: Normal rate and regular rhythm.   Pulmonary/Chest: Effort normal and breath sounds normal.  Abdominal: Soft. Bowel sounds are normal. She exhibits no distension and no mass. There is no tenderness. There is no rebound and no guarding.  Musculoskeletal: She exhibits no edema or deformity.  Neurological: She is alert and oriented to person, place, and time.  Skin: Skin is warm and dry.  Psychiatric: She has a normal mood and affect.    Labs reviewed: Basic Metabolic Panel:  Recent Labs  12/02/15 0802 06/04/16 0933 09/10/16 0827  NA 137 140 139  K 4.2 4.4 4.4  CL 101 104 103  CO2 '25 27 26  ' GLUCOSE 91 85 82  BUN 21 27* 27*  CREATININE 1.25* 1.26* 1.39*  CALCIUM 9.9 10.1 9.8  TSH 2.93  --   --    Liver Function Tests:  Recent Labs  12/02/15 0802 06/04/16 0933 09/10/16 0827  AST '15 16 18  ' ALT '12 10 16  ' ALKPHOS 76 50 60  BILITOT 0.4 0.5 0.4  PROT 7.2 7.6 7.0  ALBUMIN 3.8 4.2 3.8   No results for input(s): LIPASE, AMYLASE in the last 8760 hours. No results for input(s): AMMONIA in the last 8760 hours. CBC:  Recent Labs  12/02/15 0802 06/04/16 0933 08/08/16 1142  WBC 6.1 6.4 6.6  NEUTROABS 3,477 3,584 3,696  HGB 11.7 11.9 11.7  HCT 35.7 36.7 36.5  MCV 87.7 89.3 89.0  PLT 385 395 477*   Lipid Panel:  Recent Labs  12/02/15 0802 06/04/16 0933 09/10/16 0827  CHOL 222* 234* 211*  HDL 61 59 51  LDLCALC 136* 151* 136*  TRIG 126 122 118  CHOLHDL 3.6 4.0 4.1   TSH:  Recent Labs  12/02/15 0802  TSH 2.93   A1C: Lab Results  Component Value Date   HGBA1C 5.4 01/15/2014     Assessment/Plan 1. Nausea and vomiting, intractability of vomiting not specified,  unspecified vomiting type Improved after taking omeprazole. However has lost weight in the last week with increase nausea and vomiting. Exam WNL. Will get labs at this time.  -bland diet, advance as tolerates.  -increase fluids as tolerates, important to stay hydrated  - ondansetron (ZOFRAN ODT) 4 MG disintegrating tablet; Take 1 tablet (4 mg total) by mouth every 8 (eight) hours as needed for nausea or vomiting.  Dispense: 20 tablet; Refill: 0 - CBC with Differential/Platelets - CMP with eGFR - Amylase - Lipase  2. Gastroesophageal reflux disease with esophagitis -to restart omeprazole.  - omeprazole (PRILOSEC) 40 MG capsule; Take 1 capsule (40 mg total) by mouth daily.  Dispense: 90 capsule; Refill: 3  Next appt: as scheduled, return precautions discussed Becci Batty K. Harle Battiest  [facility] [phone] 8 am - 5 pm) [phone] (after hours)

## 2016-10-13 LAB — COMPLETE METABOLIC PANEL WITH GFR
ALBUMIN: 4 g/dL (ref 3.6–5.1)
ALK PHOS: 56 U/L (ref 33–130)
ALT: 17 U/L (ref 6–29)
AST: 22 U/L (ref 10–35)
BILIRUBIN TOTAL: 0.4 mg/dL (ref 0.2–1.2)
BUN: 21 mg/dL (ref 7–25)
CO2: 24 mmol/L (ref 20–32)
Calcium: 10.2 mg/dL (ref 8.6–10.4)
Chloride: 101 mmol/L (ref 98–110)
Creat: 1.4 mg/dL — ABNORMAL HIGH (ref 0.60–0.88)
GFR, Est African American: 38 mL/min — ABNORMAL LOW (ref >=60)
GFR, Est Non African American: 33 mL/min — ABNORMAL LOW (ref >=60)
GLUCOSE: 99 mg/dL (ref 65–99)
Potassium: 3.9 mmol/L (ref 3.5–5.3)
SODIUM: 139 mmol/L (ref 135–146)
Total Protein: 7.2 g/dL (ref 6.1–8.1)

## 2016-10-13 LAB — AMYLASE: Amylase: 45 U/L (ref 21–101)

## 2016-10-13 LAB — LIPASE: Lipase: 25 U/L (ref 7–60)

## 2016-10-24 ENCOUNTER — Ambulatory Visit: Payer: Medicare Other | Admitting: Nurse Practitioner

## 2016-12-18 ENCOUNTER — Encounter: Payer: Self-pay | Admitting: Nurse Practitioner

## 2016-12-18 ENCOUNTER — Ambulatory Visit (INDEPENDENT_AMBULATORY_CARE_PROVIDER_SITE_OTHER): Payer: Medicare Other | Admitting: Nurse Practitioner

## 2016-12-18 VITALS — BP 142/86 | HR 86 | Temp 98.6°F | Resp 17 | Ht 64.0 in | Wt 137.8 lb

## 2016-12-18 DIAGNOSIS — M5136 Other intervertebral disc degeneration, lumbar region: Secondary | ICD-10-CM

## 2016-12-18 DIAGNOSIS — E785 Hyperlipidemia, unspecified: Secondary | ICD-10-CM | POA: Diagnosis not present

## 2016-12-18 DIAGNOSIS — N183 Chronic kidney disease, stage 3 unspecified: Secondary | ICD-10-CM

## 2016-12-18 DIAGNOSIS — F329 Major depressive disorder, single episode, unspecified: Secondary | ICD-10-CM

## 2016-12-18 DIAGNOSIS — I10 Essential (primary) hypertension: Secondary | ICD-10-CM | POA: Diagnosis not present

## 2016-12-18 DIAGNOSIS — K2100 Gastro-esophageal reflux disease with esophagitis, without bleeding: Secondary | ICD-10-CM

## 2016-12-18 DIAGNOSIS — F419 Anxiety disorder, unspecified: Secondary | ICD-10-CM | POA: Diagnosis not present

## 2016-12-18 DIAGNOSIS — Z23 Encounter for immunization: Secondary | ICD-10-CM | POA: Diagnosis not present

## 2016-12-18 DIAGNOSIS — N3281 Overactive bladder: Secondary | ICD-10-CM

## 2016-12-18 DIAGNOSIS — R634 Abnormal weight loss: Secondary | ICD-10-CM | POA: Diagnosis not present

## 2016-12-18 LAB — LIPID PANEL
CHOLESTEROL: 209 mg/dL — AB (ref <200)
HDL: 62 mg/dL (ref >50)
LDL CHOLESTEROL (CALC): 125 mg/dL — AB
Non-HDL Cholesterol (Calc): 147 mg/dL (calc) — ABNORMAL HIGH (ref <130)
Total CHOL/HDL Ratio: 3.4 (calc) (ref <5.0)
Triglycerides: 113 mg/dL (ref <150)

## 2016-12-18 LAB — COMPLETE METABOLIC PANEL WITH GFR
AG Ratio: 1.2 (calc) (ref 1.0–2.5)
ALBUMIN MSPROF: 4.1 g/dL (ref 3.6–5.1)
ALKALINE PHOSPHATASE (APISO): 50 U/L (ref 33–130)
ALT: 14 U/L (ref 6–29)
AST: 20 U/L (ref 10–35)
BILIRUBIN TOTAL: 0.5 mg/dL (ref 0.2–1.2)
BUN / CREAT RATIO: 18 (calc) (ref 6–22)
BUN: 25 mg/dL (ref 7–25)
CHLORIDE: 102 mmol/L (ref 98–110)
CO2: 27 mmol/L (ref 20–32)
Calcium: 10.1 mg/dL (ref 8.6–10.4)
Creat: 1.4 mg/dL — ABNORMAL HIGH (ref 0.60–0.88)
GFR, Est African American: 39 mL/min/{1.73_m2} — ABNORMAL LOW (ref >=60)
GFR, Est Non African American: 33 mL/min/{1.73_m2} — ABNORMAL LOW (ref >=60)
GLOBULIN: 3.4 g/dL (ref 1.9–3.7)
Glucose, Bld: 85 mg/dL (ref 65–99)
POTASSIUM: 4.7 mmol/L (ref 3.5–5.3)
SODIUM: 139 mmol/L (ref 135–146)
Total Protein: 7.5 g/dL (ref 6.1–8.1)

## 2016-12-18 MED ORDER — EZETIMIBE 10 MG PO TABS: 10.0000 mg | ORAL_TABLET | Freq: Every day | ORAL | 1 refills | Status: DC

## 2016-12-18 NOTE — Progress Notes (Signed)
Careteam: Patient Care Team: Lauree Chandler, NP as PCP - General (Geriatric Medicine) Milus Banister, MD as Attending Physician (Gastroenterology) Alphonsa Overall, MD as Consulting Physician (General Surgery) Sherren Mocha, MD as Consulting Physician (Cardiology) Suella Broad, MD as Consulting Physician (Physical Medicine and Rehabilitation) Dalton-Bethea, Fabio Asa, MD as Consulting Physician (Physical Medicine and Rehabilitation)  Advanced Directive information Does Patient Have a Medical Advance Directive?: Yes, Type of Advance Directive: [name];Living will  Allergies  Allergen Reactions  . Statins     pain  . Aspirin Nausea And Vomiting  . Codeine Nausea And Vomiting  . Phenytoin Nausea And Vomiting    Chief Complaint  Patient presents with  . Medical Management of Chronic Issues    Pt is being seen for a routine visit. Pt has no concerns other than ongoing back pain.   . Other    Daughter, [name], in room.      HPI: Patient is a 83 y.o. female seen in the office today for routine follow up.  Pt with hx of OAB, DDD, spinal stenosis, OAB, hyperlipidemia, htn, depression   OAB- improved on myrbetriq, still getting up 2 times a night to urinate.   Anxiety/depression- conts on remeron 15 mg, daughter reports she is in a good mood since remeron has started, sleeping still not great. Does not sleep during the day.   Back pain- unchanged. Using tylenol 325 mg by mouth 2 tablets at bedtime. Does not take any in am.   GERD/Nausea- no ongoing nausea, still on omeprazole which helps.   Weight loss- appetite so -so, does not like to cook so she wont eat unless her daughter cooks for her.   Htn/chf- no swelling or shortness of breath. conts on coreg, norvac and hctz. Following with cardiologist goal sbp <150  Hyperlipidemia- on zetia, does not do heart healthy diet.  Fasting this morning.   Review of Systems:  Review of Systems  Constitutional:  Negative for chills, fever and malaise/fatigue.  HENT: Negative for congestion.   Eyes:       Glasses  Respiratory: Negative for shortness of breath.   Cardiovascular: Negative for chest pain, palpitations and leg swelling.  Gastrointestinal: Negative for abdominal pain.  Genitourinary: Positive for frequency. Negative for dysuria, flank pain, hematuria and urgency.  Musculoskeletal: Positive for back pain and myalgias. Negative for falls.  Neurological: Negative for dizziness and weakness.  Endo/Heme/Allergies: Bruises/bleeds easily.  Psychiatric/Behavioral: Negative for depression and memory loss. The patient does not have insomnia.     Past Medical History:  Diagnosis Date  . Anxiety   . Arthritis    Both knees R>L  . Central stenosis of spinal canal    moderate L4/5 with L4/5 and L5/S1 disc bulges  . Chronic diastolic congestive heart failure, NYHA class 2 ([place])   . Facet joint syndrome (HCC)    Left L2-3, L3-4, L4-5, L5-S1  . GERD (gastroesophageal reflux disease) 05/14/2011  . Heart murmur   . Hyperlipidemia   . Hypertension   . Left low back pain    without sacroiliac joint componet  . Lumbago   . Lumbosacral spondylosis without myelopathy   . 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 Severe aortic stenosis 12/06/2013  . Shortness of breath dyspnea   . Spinal stenosis, lumbosacral region    Past Surgical History:  Procedure Laterality Date  .  CARPAL TUNNEL RELEASE Right 10/31/2015  . CATARACT EXTRACTION Bilateral   . CHOLECYSTECTOMY  2008  . EYE SURGERY    . INTRAOPERATIVE TRANSESOPHAGEAL ECHOCARDIOGRAM N/A 01/19/2014   Procedure: INTRAOPERATIVE TRANSESOPHAGEAL ECHOCARDIOGRAM;  Surgeon: Michael Cooper, MD;  Location: [facility] OR;  Service: Open Heart Surgery;  Laterality: N/A;  . KNEE ARTHROSCOPY Bilateral   . LEFT AND RIGHT HEART CATHETERIZATION WITH  CORONARY ANGIOGRAM N/A 12/08/2013   Procedure: LEFT AND RIGHT HEART CATHETERIZATION WITH CORONARY ANGIOGRAM;  Surgeon: Mihai Croitoru, MD;  Location: [facility] CATH LAB;  Service: Cardiovascular;  Laterality: N/A;  . REFRACTIVE SURGERY Bilateral   . REPLACEMENT TOTAL KNEE Right   . TONSILLECTOMY     70 years ago  . TOTAL KNEE ARTHROPLASTY Right 04/28/2014   Procedure: TOTAL KNEE ARTHROPLASTY;  Surgeon: Daniel Murphy, MD;  Location: [facility] OR;  Service: Orthopedics;  Laterality: Right;  . TRANSCATHETER AORTIC VALVE REPLACEMENT, TRANSFEMORAL N/A 01/19/2014   Procedure: TRANSCATHETER AORTIC VALVE REPLACEMENT, TRANSFEMORAL;  Surgeon: Michael Cooper, MD;  Location: [facility] OR;  Service: Open Heart Surgery;  Laterality: N/A;   Social History:   reports that she has never smoked. She has never used smokeless tobacco. She reports that she does not drink alcohol or use drugs.  Family History  Problem Relation Age of Onset  . Muscular dystrophy Father        Died at 42  . Stroke Mother   . Ovarian cancer Sister   . Breast cancer Sister   . Heart attack Sister        Died at 34  . Colon cancer Neg Hx   . Hypertension Neg Hx     Medications: Patient's Medications  New Prescriptions   No medications on file  Previous Medications   AMLODIPINE (NORVASC) 10 MG TABLET    Take 1 tablet (10 mg total) by mouth daily.   CARVEDILOL (COREG) 12.5 MG TABLET    Take 1 tablet (12.5 mg total) by mouth 2 (two) times daily.   CHOLECALCIFEROL (VITAMIN D) 1000 UNITS TABLET    Take 1,000 Units by mouth 2 (two) times daily.    FENOFIBRATE 160 MG TABLET    Take 1 tablet (160 mg total) by mouth daily.   FLUTICASONE (FLONASE) 50 MCG/ACT NASAL SPRAY    Place 1 spray into both nostrils 2 (two) times daily as needed (congestion).   HYDROCHLOROTHIAZIDE (HYDRODIURIL) 25 MG TABLET    Take 1 tablet (25 mg total) by mouth daily.   MIRABEGRON ER (MYRBETRIQ) 25 MG TB24 TABLET    Take 1 tablet (25 mg total) by mouth daily.   MIRTAZAPINE  (REMERON) 15 MG TABLET    Take 1 tablet (15 mg total) by mouth at bedtime.   MULTIPLE VITAMINS-MINERALS (ICAPS AREDS 2) CAPS    Take 2 capsules by mouth daily.   OMEPRAZOLE (PRILOSEC) 40 MG CAPSULE    Take 1 capsule (40 mg total) by mouth daily.  Modified Medications   Modified Medication Previous Medication   EZETIMIBE (ZETIA) 10 MG TABLET ezetimibe (ZETIA) 10 MG tablet      Take 1 tablet (10 mg total) by mouth daily.    TAKE 1 TABLET BY MOUTH ONCE DAILY  Discontinued Medications   ONDANSETRON (ZOFRAN ODT) 4 MG DISINTEGRATING TABLET    Take 1 tablet (4 mg total) by mouth every 8 (eight) hours as needed for nausea or vomiting.     Physical Exam:  Vitals:   12/18/16 0835  BP: (!) 142/86  Pulse: 86  Resp: 17    Temp: 98.6 F (37 C)  TempSrc: Oral  SpO2: 95%  Weight: 137 lb 12.8 oz (62.5 kg)  Height: 5' 4" (1.626 m)   Body mass index is 23.65 kg/m.  Physical Exam  Constitutional: She is oriented to person, place, and time. She appears well-developed and well-nourished. No distress.  Eyes: Pupils are equal, round, and reactive to light.  Neck: Normal range of motion.  Cardiovascular: Normal rate, regular rhythm and normal heart sounds.   Pulmonary/Chest: Effort normal and breath sounds normal.  Abdominal: Soft. Bowel sounds are normal. She exhibits no distension. There is no tenderness.  Musculoskeletal: Normal range of motion. She exhibits tenderness (left lumbar spine). She exhibits no edema.  Cautious gait  Neurological: She is alert and oriented to person, place, and time. She displays normal reflexes. No cranial nerve deficit or sensory deficit. She exhibits normal muscle tone. Coordination normal.  Skin: Skin is warm and dry. Ecchymosis (scattered ecchymosis) noted.  Psychiatric: She has a normal mood and affect.   Labs reviewed: Basic Metabolic Panel:  Recent Labs  06/04/16 0933 09/10/16 0827 10/12/16 0941  NA 140 139 139  K 4.4 4.4 3.9  CL 104 103 101  CO2 _0 GLUCOSE 85 82 99  BUN 27* 27* 21  CREATININE 1.26* 1.39* 1.40*  CALCIUM 10.1 9.8 10.2   Liver Function Tests:  Recent Labs  06/04/16 0933 09/10/16 0827 10/12/16 0941  AST _1 ALT _2 ALKPHOS 50 60 56  BILITOT 0.5 0.4 0.4  PROT 7.6 7.0 7.2  ALBUMIN 4.2 3.8 4.0    Recent Labs  10/12/16 0941  LIPASE 25  AMYLASE 45   No results for input(s): AMMONIA in the last 8760 hours. CBC:  Recent Labs  06/04/16 0933 08/08/16 1142 10/12/16 0941  WBC 6.4 6.6 6.5  NEUTROABS 3,584 3,696 3,510  HGB 11.9 11.7 11.7  HCT 36.7 36.5 35.8  MCV 89.3 89.0 89.7  PLT 395 477* 384   Lipid Panel:  Recent Labs  06/04/16 0933 09/10/16 0827  CHOL 234* 211*  HDL 59 51  LDLCALC 151* 136*  TRIG 122 118  CHOLHDL 4.0 4.1   TSH: No results for input(s): TSH in the last 8760 hours. A1C: Lab Results  Component Value Date   HGBA1C 5.4 01/15/2014     Assessment/Plan 1. Gastroesophageal reflux disease with esophagitis -without problems now that she has been taking omeprazole.   2. OAB (overactive bladder) Improved on myrbetriq   3. Lumbar degenerative disc disease Stable, may use tylenol 650 mg by mouth twice daily. To avoid NSAIDs  4. Loss of weight -stable, encouraged proper nutrition.   5. Essential hypertension -stable, will cont current regimen at this time.   6. Hyperlipidemia, unspecified hyperlipidemia type -does not follow heart healthy diet, information given, to cont zetia and follow up - CMP with eGFR - Lipid Panel  7. Anxiety and depression Improved since on remeron, will cont current regimen.   8. Chronic kidney disease (CKD), stage III (moderate) (HCC) -Encourage proper hydration and to avoid NSAIDS (Aleve, Advil, Motrin, Ibuprofen)  - CMP with eGFR  Next appt: 6 months, sooner if needed Kapri Nero K. Wardell Honour  [facility] [phone] 8 am - 5 pm) [phone] (after hours)

## 2016-12-18 NOTE — Patient Instructions (Addendum)
May use tylenol  325 mg 2 tablets in the morning and in the evening  Encourage proper hydration and to avoid NSAIDS (Aleve, Advil, Motrin, Ibuprofen)   Heart-Healthy Eating Plan Many factors influence your heart health, including eating and exercise habits. Heart (coronary) risk increases with abnormal blood fat (lipid) levels. Heart-healthy meal planning includes limiting unhealthy fats, increasing healthy fats, and making other small dietary changes. This includes maintaining a healthy body weight to help keep lipid levels within a normal range.  What types of fat should I choose?  Choose healthy fats more often. Choose monounsaturated and polyunsaturated fats, such as olive oil and canola oil, flaxseeds, walnuts, almonds, and seeds.  Eat more omega-3 fats. Good choices include salmon, mackerel, sardines, tuna, flaxseed oil, and ground flaxseeds. Aim to eat fish at least two times each week.  Limit saturated fats. Saturated fats are primarily found in animal products, such as meats, butter, and cream. Plant sources of saturated fats include palm oil, palm kernel oil, and coconut oil.  Avoid foods with partially hydrogenated oils in them. These contain trans fats. Examples of foods that contain trans fats are stick margarine, some tub margarines, cookies, crackers, and other baked goods. What general guidelines do I need to follow?  Check food labels carefully to identify foods with trans fats or high amounts of saturated fat.  Fill one half of your plate with vegetables and green salads. Eat 4-5 servings of vegetables per day. A serving of vegetables equals 1 cup of raw leafy vegetables,  cup of raw or cooked cut-up vegetables, or  cup of vegetable juice.  Fill one fourth of your plate with whole grains. Look for the word "whole" as the first word in the ingredient list.  Fill one fourth of your plate with lean protein foods.  Eat 4-5 servings of fruit per day. A serving of fruit equals  one medium whole fruit,  cup of dried fruit,  cup of fresh, frozen, or canned fruit, or  cup of 100% fruit juice.  Eat more foods that contain soluble fiber. Examples of foods that contain this type of fiber are apples, broccoli, carrots, beans, peas, and barley. Aim to get 20-30 g of fiber per day.  Eat more home-cooked food and less restaurant, buffet, and fast food.  Limit or avoid alcohol.  Limit foods that are high in starch and sugar.  Avoid fried foods.  Cook foods by using methods other than frying. Baking, boiling, grilling, and broiling are all great options. Other fat-reducing suggestions include: ? Removing the skin from poultry. ? Removing all visible fats from meats. ? Skimming the fat off of stews, soups, and gravies before serving them. ? Steaming vegetables in water or broth.  Lose weight if you are overweight. Losing just 5-10% of your initial body weight can help your overall health and prevent diseases such as diabetes and heart disease.  Increase your consumption of nuts, legumes, and seeds to 4-5 servings per week. One serving of dried beans or legumes equals  cup after being cooked, one serving of nuts equals 1 ounces, and one serving of seeds equals  ounce or 1 tablespoon.  You may need to monitor your salt (sodium) intake, especially if you have high blood pressure. Talk with your health care provider or dietitian to get more information about reducing sodium. What foods can I eat? Grains  Breads, including [place], white, pita, wheat, raisin, rye, oatmeal, and [place]. Tortillas that are neither fried nor made with  lard or trans fat. Low-fat rolls, including hotdog and hamburger buns and English muffins. Biscuits. Muffins. Waffles. Pancakes. Light popcorn. Whole-grain cereals. Flatbread. Melba toast. Pretzels. Breadsticks. Rusks. Low-fat snacks and crackers, including oyster, saltine, matzo, graham, animal, and rye. Rice and pasta, including brown rice and  those that are made with whole wheat. Vegetables All vegetables. Fruits All fruits, but limit coconut. Meats and Other Protein Sources Lean, well-trimmed beef, veal, pork, and lamb. Chicken and [place] without skin. All fish and shellfish. Wild duck, rabbit, pheasant, and venison. Egg whites or low-cholesterol egg substitutes. Dried beans, peas, lentils, and tofu.Seeds and most nuts. Dairy Low-fat or nonfat cheeses, including ricotta, string, and mozzarella. Skim or 1% milk that is liquid, powdered, or evaporated. Buttermilk that is made with low-fat milk. Nonfat or low-fat yogurt. Beverages Mineral water. Diet carbonated beverages. Sweets and Desserts Sherbets and fruit ices. Honey, jam, marmalade, jelly, and syrups. Meringues and gelatins. Pure sugar candy, such as hard candy, jelly beans, gumdrops, mints, marshmallows, and small amounts of dark chocolate. [employer] Eat all sweets and desserts in moderation. Fats and Oils Nonhydrogenated (trans-free) margarines. Vegetable oils, including soybean, sesame, sunflower, olive, peanut, safflower, corn, canola, and cottonseed. Salad dressings or mayonnaise that are made with a vegetable oil. Limit added fats and oils that you use for cooking, baking, salads, and as spreads. Other Cocoa powder. Coffee and tea. All seasonings and condiments. The items listed above may not be a complete list of recommended foods or beverages. Contact your dietitian for more options. What foods are not recommended? Grains Breads that are made with saturated or trans fats, oils, or whole milk. Croissants. Butter rolls. Cheese breads. Sweet rolls. Donuts. Buttered popcorn. Chow mein noodles. High-fat crackers, such as cheese or butter crackers. Meats and Other Protein Sources Fatty meats, such as hotdogs, short ribs, sausage, spareribs, bacon, ribeye roast or steak, and mutton. High-fat deli meats, such as salami and bologna. Caviar. Domestic duck and goose. Organ  meats, such as kidney, liver, sweetbreads, brains, gizzard, chitterlings, and heart. Dairy Cream, sour cream, cream cheese, and creamed cottage cheese. Whole milk cheeses, including blue (bleu), Monte[place] Jack, [place], [place], [place], [place], [place], [place], [place], and [place]. Whole or 2% milk that is liquid, evaporated, or condensed. Whole buttermilk. Cream sauce or high-fat cheese sauce. Yogurt that is made from whole milk. Beverages Regular sodas and drinks with added sugar. Sweets and Desserts Frosting. Pudding. Cookies. Cakes other than angel food cake. Candy that has milk chocolate or white chocolate, hydrogenated fat, butter, coconut, or unknown ingredients. Buttered syrups. Full-fat ice cream or ice cream drinks. Fats and Oils Gravy that has suet, meat fat, or shortening. Cocoa butter, hydrogenated oils, palm oil, coconut oil, palm kernel oil. These can often be found in baked products, candy, fried foods, nondairy creamers, and whipped toppings. Solid fats and shortenings, including bacon fat, salt pork, lard, and butter. Nondairy cream substitutes, such as coffee creamers and sour cream substitutes. Salad dressings that are made of unknown oils, cheese, or sour cream. The items listed above may not be a complete list of foods and beverages to avoid. Contact your dietitian for more information. This information is not intended to replace advice given to you by your health care provider. Make sure you discuss any questions you have with your health care provider. Document Released: 11/15/2007 Document Revised: 08/26/2015 Document Reviewed: 07/30/2013 Elsevier Interactive Patient Education  2017 [employer].

## 2016-12-20 ENCOUNTER — Other Ambulatory Visit: Payer: Self-pay

## 2016-12-20 ENCOUNTER — Telehealth: Payer: Self-pay

## 2016-12-20 NOTE — Telephone Encounter (Signed)
Medication changes were made to reflect recommendations of provider based on recent lab results.

## 2016-12-20 NOTE — Telephone Encounter (Signed)
-----   Message from Lauree Chandler, NP sent at 12/20/2016  9:59 AM EDT ----- Kidney function slightly elevated- lets cut her HCTZ in half so she is taking 12.5 if possible take her blood pressure at home after medication ( a few times if possible, if not this is ok) we will follow up with her in 2 weeks for blood pressure. Also to reinforce low sodium diet. No added salt. Cholesterol as slightly improved, cont zetia.

## 2017-01-03 ENCOUNTER — Ambulatory Visit (INDEPENDENT_AMBULATORY_CARE_PROVIDER_SITE_OTHER): Payer: Medicare Other | Admitting: Nurse Practitioner

## 2017-01-03 ENCOUNTER — Encounter: Payer: Self-pay | Admitting: Nurse Practitioner

## 2017-01-03 VITALS — BP 126/82 | HR 73 | Temp 98.6°F | Resp 17 | Ht 64.0 in | Wt 135.0 lb

## 2017-01-03 DIAGNOSIS — N183 Chronic kidney disease, stage 3 unspecified: Secondary | ICD-10-CM

## 2017-01-03 DIAGNOSIS — I10 Essential (primary) hypertension: Secondary | ICD-10-CM | POA: Diagnosis not present

## 2017-01-03 NOTE — Progress Notes (Signed)
Careteam: Patient Care Team: Lauree Chandler, NP as PCP - General (Geriatric Medicine) Milus Banister, MD as Attending Physician (Gastroenterology) Alphonsa Overall, MD as Consulting Physician (General Surgery) Sherren Mocha, MD as Consulting Physician (Cardiology) Suella Broad, MD as Consulting Physician (Physical Medicine and Rehabilitation) Dalton-Bethea, Fabio Asa, MD as Consulting Physician (Physical Medicine and Rehabilitation)  Advanced Directive information Does Patient Have a Medical Advance Directive?: Yes, Type of Advance Directive: [place];Living will, Does patient want to make changes to medical advance directive?: No - Patient declined  Allergies  Allergen Reactions  . Statins     pain  . Aspirin Nausea And Vomiting  . Codeine Nausea And Vomiting  . Phenytoin Nausea And Vomiting    Chief Complaint  Patient presents with  . Follow-up    Pt is being seen for a blood pressure recheck.   . Other    Daughter, [name], in room     HPI: Patient is a 83 y.o. female seen in the office today to follow up blood pressure. pts renal function had been trending up therefore HCTZ was decreased to 12.5 mg daily.  Drinks about 48 oz of water a day.  No swelling noted. No shortness of breath or cough.  Blood pressure ranged from 112-151/51-72  Review of Systems:  Review of Systems  Constitutional: Negative for chills, fever and malaise/fatigue.  HENT: Negative for congestion.   Eyes:       Glasses  Respiratory: Negative for cough and shortness of breath.   Cardiovascular: Negative for chest pain, palpitations and leg swelling.  Gastrointestinal: Negative for abdominal pain.  Neurological: Negative for dizziness and weakness.  Endo/Heme/Allergies: Bruises/bleeds easily.  Psychiatric/Behavioral: Negative for depression and memory loss. The patient does not have insomnia.     Past Medical History:  Diagnosis Date  . Anxiety   . Arthritis    Both knees R>L  . Central stenosis of spinal canal    moderate L4/5 with L4/5 and L5/S1 disc bulges  . Chronic diastolic congestive heart failure, NYHA class 2 ([place])   . Facet joint syndrome (HCC)    Left L2-3, L3-4, L4-5, L5-S1  . GERD (gastroesophageal reflux disease) 05/14/2011  . Heart murmur   . Hyperlipidemia   . Hypertension   . Left low back pain    without sacroiliac joint componet  . Lumbago   . Lumbosacral spondylosis without myelopathy   . 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 Severe aortic stenosis 12/06/2013  . Shortness of breath dyspnea   . Spinal stenosis, lumbosacral region    Past Surgical History:  Procedure Laterality Date  . CARPAL TUNNEL RELEASE Right 10/31/2015  . CATARACT EXTRACTION Bilateral   . CHOLECYSTECTOMY  2008  . EYE SURGERY    . INTRAOPERATIVE TRANSESOPHAGEAL ECHOCARDIOGRAM N/A 01/19/2014   Procedure: INTRAOPERATIVE TRANSESOPHAGEAL ECHOCARDIOGRAM;  Surgeon: Sherren Mocha, MD;  Location: [facility] OR;  Service: Open Heart Surgery;  Laterality: N/A;  . KNEE ARTHROSCOPY Bilateral   . LEFT AND RIGHT HEART CATHETERIZATION WITH CORONARY ANGIOGRAM N/A 12/08/2013   Procedure: LEFT AND RIGHT HEART CATHETERIZATION WITH CORONARY ANGIOGRAM;  Surgeon: Sanda Klein, MD;  Location: [place] CATH LAB;  Service: Cardiovascular;  Laterality: N/A;  . REFRACTIVE SURGERY Bilateral   . REPLACEMENT TOTAL KNEE Right   . TONSILLECTOMY     70 years ago  . TOTAL KNEE ARTHROPLASTY Right 04/28/2014  Procedure: TOTAL KNEE ARTHROPLASTY;  Surgeon: Kathryne Hitch, MD;  Location: [place];  Service: Orthopedics;  Laterality: Right;  . TRANSCATHETER AORTIC VALVE REPLACEMENT, TRANSFEMORAL N/A 01/19/2014   Procedure: TRANSCATHETER AORTIC VALVE REPLACEMENT, TRANSFEMORAL;  Surgeon: Sherren Mocha, MD;  Location: [place];  Service: Open Heart Surgery;  Laterality: N/A;   Social  History:   reports that  has never smoked. she has never used smokeless tobacco. She reports that she does not drink alcohol or use drugs.  Family History  Problem Relation Age of Onset  . Muscular dystrophy Father        Died at 83  . Stroke Mother   . Ovarian cancer Sister   . Breast cancer Sister   . Heart attack Sister        Died at 27  . Colon cancer Neg Hx   . Hypertension Neg Hx     Medications:   Medication List        Accurate as of 01/03/17  8:43 AM. Always use your most recent med list.          amLODipine 10 MG tablet Commonly known as:  NORVASC Take 1 tablet (10 mg total) by mouth daily.   carvedilol 12.5 MG tablet Commonly known as:  COREG Take 1 tablet (12.5 mg total) by mouth 2 (two) times daily.   cholecalciferol 1000 units tablet Commonly known as:  VITAMIN D   ezetimibe 10 MG tablet Commonly known as:  ZETIA Take 1 tablet (10 mg total) by mouth daily.   fenofibrate 160 MG tablet Take 1 tablet (160 mg total) by mouth daily.   fluticasone 50 MCG/ACT nasal spray Commonly known as:  FLONASE Place 1 spray into both nostrils 2 (two) times daily as needed (congestion).   hydrochlorothiazide 12.5 MG tablet Commonly known as:  HYDRODIURIL   ICAPS AREDS 2 Caps   mirabegron ER 25 MG Tb24 tablet Commonly known as:  MYRBETRIQ Take 1 tablet (25 mg total) by mouth daily.   mirtazapine 15 MG tablet Commonly known as:  REMERON Take 1 tablet (15 mg total) by mouth at bedtime.   omeprazole 40 MG capsule Commonly known as:  PRILOSEC Take 1 capsule (40 mg total) by mouth daily.        Physical Exam:  Vitals:   01/03/17 0839  BP: 126/82  Pulse: 73  Resp: 17  Temp: 98.6 F (37 C)  TempSrc: Oral  SpO2: 96%  Weight: 135 lb (61.2 kg)  Height: 5\' 4"  (1.626 m)   Body mass index is 23.17 kg/m.  Physical Exam  Constitutional: She is oriented to person, place, and time. She appears well-developed and well-nourished. No distress.  HENT:    Head: Normocephalic and atraumatic.  Mouth/Throat: Oropharynx is clear and moist. No oropharyngeal exudate.  Cardiovascular: Normal rate, regular rhythm and normal heart sounds.  Pulmonary/Chest: Effort normal and breath sounds normal.  Musculoskeletal: She exhibits no edema.  Neurological: She is alert and oriented to person, place, and time.  Skin: She is not diaphoretic.  Psychiatric: She has a normal mood and affect.    Labs reviewed: Basic Metabolic Panel: Recent Labs    09/10/16 0827 10/12/16 0941 12/18/16 0926  NA 139 139 139  K 4.4 3.9 4.7  CL 103 101 102  CO2 26 24 27   GLUCOSE 82 99 85  BUN 27* 21 25  CREATININE 1.39* 1.40* 1.40*  CALCIUM 9.8 10.2 10.1   Liver Function Tests: Recent Labs    06/04/16 0933  09/10/16 0827 10/12/16 0941 12/18/16 0926  AST 16 18 22 20   ALT 10 16 17 14   ALKPHOS 50 60 56  --   BILITOT 0.5 0.4 0.4 0.5  PROT 7.6 7.0 7.2 7.5  ALBUMIN 4.2 3.8 4.0  --    Recent Labs    10/12/16 0941  LIPASE 25  AMYLASE 45   No results for input(s): AMMONIA in the last 8760 hours. CBC: Recent Labs    06/04/16 0933 08/08/16 1142 10/12/16 0941  WBC 6.4 6.6 6.5  NEUTROABS 3,584 3,696 3,510  HGB 11.9 11.7 11.7  HCT 36.7 36.5 35.8  MCV 89.3 89.0 89.7  PLT 395 477* 384   Lipid Panel: Recent Labs    06/04/16 0933 09/10/16 0827 12/18/16 0926  CHOL 234* 211* 209*  HDL 59 51 62  LDLCALC 151* 136*  --   TRIG 122 118 113  CHOLHDL 4.0 4.1 3.4   TSH: No results for input(s): TSH in the last 8760 hours. A1C: Lab Results  Component Value Date   HGBA1C 5.4 01/15/2014     Assessment/Plan 1. Essential hypertension -stable with reduced dose of HCTZ, will stop at this time and monitor. -dash diet given.  -to call if blood pressure over 140/90 on several readings   2. CKD (chronic kidney disease) stage 3, GFR 30-59 ml/min (HCC) Slight worsening of renal function over last few labs, will DC HCTZ and monitor blood pressure at this time.   Encourage proper hydration and to avoid NSAIDS (Aleve, Advil, Motrin, Ibuprofen)    Next appt:  Carlos American. Harle Battiest  [facility] [phone] 8 am - 5 pm) [phone] (after hours)

## 2017-01-03 NOTE — Patient Instructions (Addendum)
Stop taking HCTZ Cont to take blood pressure occasionally 1-2 times a week, after you have taken your medication and make sure you have been sitting at least 5 mins when you take the blood pressure.   Call if blood pressure constantly staying over 140/90   DASH Eating Plan DASH stands for "Dietary Approaches to Stop Hypertension." The DASH eating plan is a healthy eating plan that has been shown to reduce high blood pressure (hypertension). It may also reduce your risk for type 2 diabetes, heart disease, and stroke. The DASH eating plan may also help with weight loss. What are tips for following this plan? General guidelines  Avoid eating more than 2,300 mg (milligrams) of salt (sodium) a day. If you have hypertension, you may need to reduce your sodium intake to 1,500 mg a day.  Limit alcohol intake to no more than 1 drink a day for nonpregnant women and 2 drinks a day for men. One drink equals 12 oz of beer, 5 oz of wine, or 1 oz of hard liquor.  Work with your health care provider to maintain a healthy body weight or to lose weight. Ask what an ideal weight is for you.  Get at least 30 minutes of exercise that causes your heart to beat faster (aerobic exercise) most days of the week. Activities may include walking, swimming, or biking.  Work with your health care provider or diet and nutrition specialist (dietitian) to adjust your eating plan to your individual calorie needs. Reading food labels  Check food labels for the amount of sodium per serving. Choose foods with less than 5 percent of the Daily Value of sodium. Generally, foods with less than 300 mg of sodium per serving fit into this eating plan.  To find whole grains, look for the word "whole" as the first word in the ingredient list. Shopping  Buy products labeled as "low-sodium" or "no salt added."  Buy fresh foods. Avoid canned foods and premade or frozen meals. Cooking  Avoid adding salt when cooking. Use salt-free  seasonings or herbs instead of table salt or sea salt. Check with your health care provider or pharmacist before using salt substitutes.  Do not fry foods. Cook foods using healthy methods such as baking, boiling, grilling, and broiling instead.  Cook with heart-healthy oils, such as olive, canola, soybean, or sunflower oil. Meal planning   Eat a balanced diet that includes: ? 5 or more servings of fruits and vegetables each day. At each meal, try to fill half of your plate with fruits and vegetables. ? Up to 6-8 servings of whole grains each day. ? Less than 6 oz of lean meat, poultry, or fish each day. A 3-oz serving of meat is about the same size as a deck of cards. One egg equals 1 oz. ? 2 servings of low-fat dairy each day. ? A serving of nuts, seeds, or beans 5 times each week. ? Heart-healthy fats. Healthy fats called Omega-3 fatty acids are found in foods such as flaxseeds and coldwater fish, like sardines, salmon, and mackerel.  Limit how much you eat of the following: ? Canned or prepackaged foods. ? Food that is high in trans fat, such as fried foods. ? Food that is high in saturated fat, such as fatty meat. ? Sweets, desserts, sugary drinks, and other foods with added sugar. ? Full-fat dairy products.  Do not salt foods before eating.  Try to eat at least 2 vegetarian meals each week.  Eat more  home-cooked food and less restaurant, buffet, and fast food.  When eating at a restaurant, ask that your food be prepared with less salt or no salt, if possible. What foods are recommended? The items listed may not be a complete list. Talk with your dietitian about what dietary choices are best for you. Grains Whole-grain or whole-wheat bread. Whole-grain or whole-wheat pasta. Brown rice. [place]. Bulgur. Whole-grain and low-sodium cereals. Pita bread. Low-fat, low-sodium crackers. Whole-wheat flour tortillas. Vegetables Fresh or frozen vegetables (raw, steamed, roasted,  or grilled). Low-sodium or reduced-sodium tomato and vegetable juice. Low-sodium or reduced-sodium tomato sauce and tomato paste. Low-sodium or reduced-sodium canned vegetables. Fruits All fresh, dried, or frozen fruit. Canned fruit in natural juice (without added sugar). Meat and other protein foods Skinless chicken or [place]. Ground chicken or [place]. Pork with fat trimmed off. Fish and seafood. Egg whites. Dried beans, peas, or lentils. Unsalted nuts, nut butters, and seeds. Unsalted canned beans. Lean cuts of beef with fat trimmed off. Low-sodium, lean deli meat. Dairy Low-fat (1%) or fat-free (skim) milk. Fat-free, low-fat, or reduced-fat cheeses. Nonfat, low-sodium ricotta or cottage cheese. Low-fat or nonfat yogurt. Low-fat, low-sodium cheese. Fats and oils Soft margarine without trans fats. Vegetable oil. Low-fat, reduced-fat, or light mayonnaise and salad dressings (reduced-sodium). Canola, safflower, olive, soybean, and sunflower oils. Avocado. Seasoning and other foods Herbs. Spices. Seasoning mixes without salt. Unsalted popcorn and pretzels. Fat-free sweets. What foods are not recommended? The items listed may not be a complete list. Talk with your dietitian about what dietary choices are best for you. Grains Baked goods made with fat, such as croissants, muffins, or some breads. Dry pasta or rice meal packs. Vegetables Creamed or fried vegetables. Vegetables in a cheese sauce. Regular canned vegetables (not low-sodium or reduced-sodium). Regular canned tomato sauce and paste (not low-sodium or reduced-sodium). Regular tomato and vegetable juice (not low-sodium or reduced-sodium). Angie Fava. Olives. Fruits Canned fruit in a light or heavy syrup. Fried fruit. Fruit in cream or butter sauce. Meat and other protein foods Fatty cuts of meat. Ribs. Fried meat. Berniece Salines. Sausage. Bologna and other processed lunch meats. Salami. Fatback. Hotdogs. Bratwurst. Salted nuts and seeds. Canned beans  with added salt. Canned or smoked fish. Whole eggs or egg yolks. Chicken or [place] with skin. Dairy Whole or 2% milk, cream, and half-and-half. Whole or full-fat cream cheese. Whole-fat or sweetened yogurt. Full-fat cheese. Nondairy creamers. Whipped toppings. Processed cheese and cheese spreads. Fats and oils Butter. Stick margarine. Lard. Shortening. Ghee. Bacon fat. Tropical oils, such as coconut, palm kernel, or palm oil. Seasoning and other foods Salted popcorn and pretzels. Onion salt, garlic salt, seasoned salt, table salt, and sea salt. Worcestershire sauce. Tartar sauce. Barbecue sauce. Teriyaki sauce. Soy sauce, including reduced-sodium. Steak sauce. Canned and packaged gravies. Fish sauce. Oyster sauce. Cocktail sauce. Horseradish that you find on the shelf. Ketchup. Mustard. Meat flavorings and tenderizers. Bouillon cubes. Hot sauce and Tabasco sauce. Premade or packaged marinades. Premade or packaged taco seasonings. Relishes. Regular salad dressings. Where to find more information:  National Heart, Lung, and [place]: https://wilson-eaton.com/  American Heart Association: www.heart.org Summary  The DASH eating plan is a healthy eating plan that has been shown to reduce high blood pressure (hypertension). It may also reduce your risk for type 2 diabetes, heart disease, and stroke.  With the DASH eating plan, you should limit salt (sodium) intake to 2,300 mg a day. If you have hypertension, you may need to reduce your sodium intake to 1,500  mg a day.  When on the DASH eating plan, aim to eat more fresh fruits and vegetables, whole grains, lean proteins, low-fat dairy, and heart-healthy fats.  Work with your health care provider or diet and nutrition specialist (dietitian) to adjust your eating plan to your individual calorie needs. This information is not intended to replace advice given to you by your health care provider. Make sure you discuss any questions you have with your health  care provider. Document Released: 01/25/2011 Document Revised: 01/30/2016 Document Reviewed: 01/30/2016 Elsevier Interactive Patient Education  2017 [employer].

## 2017-03-07 ENCOUNTER — Other Ambulatory Visit: Payer: Self-pay | Admitting: *Deleted

## 2017-03-07 MED ORDER — FLUTICASONE PROPIONATE 50 MCG/ACT NA SUSP: 1.0000 | Freq: Two times a day (BID) | NASAL | 3 refills | Status: DC | PRN

## 2017-03-07 NOTE — Telephone Encounter (Signed)
Patient requested refill

## 2017-03-16 ENCOUNTER — Other Ambulatory Visit: Payer: Self-pay | Admitting: Internal Medicine

## 2017-03-20 ENCOUNTER — Other Ambulatory Visit: Payer: Self-pay | Admitting: *Deleted

## 2017-03-20 ENCOUNTER — Other Ambulatory Visit: Payer: Self-pay | Admitting: Internal Medicine

## 2017-03-20 DIAGNOSIS — R634 Abnormal weight loss: Secondary | ICD-10-CM

## 2017-03-20 MED ORDER — AMLODIPINE BESYLATE 10 MG PO TABS: 10.0000 mg | ORAL_TABLET | Freq: Every day | ORAL | 1 refills | Status: DC

## 2017-03-20 NOTE — Telephone Encounter (Signed)
Patient called and stated that pharmacy has not received her RF for blood pressure medication. Refaxed.

## 2017-04-17 ENCOUNTER — Ambulatory Visit (INDEPENDENT_AMBULATORY_CARE_PROVIDER_SITE_OTHER): Payer: Medicare Other | Admitting: Nurse Practitioner

## 2017-04-17 ENCOUNTER — Encounter: Payer: Self-pay | Admitting: Nurse Practitioner

## 2017-04-17 VITALS — BP 136/84 | HR 70 | Temp 98.4°F | Ht 64.0 in | Wt 135.0 lb

## 2017-04-17 DIAGNOSIS — K2100 Gastro-esophageal reflux disease with esophagitis, without bleeding: Secondary | ICD-10-CM

## 2017-04-17 DIAGNOSIS — N183 Chronic kidney disease, stage 3 unspecified: Secondary | ICD-10-CM

## 2017-04-17 DIAGNOSIS — I10 Essential (primary) hypertension: Secondary | ICD-10-CM

## 2017-04-17 DIAGNOSIS — M5136 Other intervertebral disc degeneration, lumbar region: Secondary | ICD-10-CM

## 2017-04-17 DIAGNOSIS — N3281 Overactive bladder: Secondary | ICD-10-CM

## 2017-04-17 DIAGNOSIS — F329 Major depressive disorder, single episode, unspecified: Secondary | ICD-10-CM | POA: Diagnosis not present

## 2017-04-17 DIAGNOSIS — F419 Anxiety disorder, unspecified: Secondary | ICD-10-CM

## 2017-04-17 DIAGNOSIS — M810 Age-related osteoporosis without current pathological fracture: Secondary | ICD-10-CM

## 2017-04-17 DIAGNOSIS — M51369 Other intervertebral disc degeneration, lumbar region without mention of lumbar back pain or lower extremity pain: Secondary | ICD-10-CM

## 2017-04-17 LAB — BASIC METABOLIC PANEL WITH GFR
BUN / CREAT RATIO: 19 (calc) (ref 6–22)
BUN: 25 mg/dL (ref 7–25)
CHLORIDE: 105 mmol/L (ref 98–110)
CO2: 28 mmol/L (ref 20–32)
Calcium: 10.2 mg/dL (ref 8.6–10.4)
Creat: 1.35 mg/dL — ABNORMAL HIGH (ref 0.60–0.88)
GFR, EST AFRICAN AMERICAN: 40 mL/min/{1.73_m2} — AB (ref >=60)
GFR, Est Non African American: 34 mL/min/{1.73_m2} — ABNORMAL LOW (ref >=60)
Glucose, Bld: 94 mg/dL (ref 65–99)
Potassium: 4.4 mmol/L (ref 3.5–5.3)
Sodium: 140 mmol/L (ref 135–146)

## 2017-04-17 MED ORDER — ALENDRONATE SODIUM 70 MG PO TABS: 70.0000 mg | ORAL_TABLET | ORAL | 0 refills | Status: DC

## 2017-04-17 NOTE — Progress Notes (Signed)
Careteam: Patient Care Team: Lauree Chandler, NP as PCP - General (Geriatric Medicine) Milus Banister, MD as Attending Physician (Gastroenterology) Alphonsa Overall, MD as Consulting Physician (General Surgery) Sherren Mocha, MD as Consulting Physician (Cardiology) Suella Broad, MD as Consulting Physician (Physical Medicine and Rehabilitation) Dalton-Bethea, Fabio Asa, MD as Consulting Physician (Physical Medicine and Rehabilitation)  Advanced Directive information    Allergies  Allergen Reactions  . Statins     pain  . Aspirin Nausea And Vomiting  . Codeine Nausea And Vomiting  . Phenytoin Nausea And Vomiting    Chief Complaint  Patient presents with  . Medical Management of Chronic Issues    Pt is being seen for a 4 month routine visit. Pt is still having ongoing back pain but no other concerns.   . Other    Daugher, [name], in the room     HPI: Patient is a 84 y.o. female seen in the office today for her routine 4 months follow-up on her chronic health conditions.  Hypertension: States she does not routinely check her blood pressure at home.  She has been taking her Norvasc and Coreg as prescribed.  Tries to adhere to a DASH diet.  Rides her sitting bicycle for 15 minutes on average 3 times per week.  Voiced no concerns today.    Hyperlipidemia: Taking Zetia and Fenofibrate as prescribed.  Voiced no concerns today.  CKD (chronic kidney disease) stage 3, GFR 30-59 ml/min ([place]): States she has been drinking at least 43oz of water a day since her last office visit in 11/2016.  States this is an improvement in her daily fluid intake.  Notes not taking any NSAIDS (Aleve, Advil, Motrin, Ibuprofen) medications.     GERD: Taking Prilosec as prescribed, no complaints of heart burn. Feels like this is under good control   Lumbar degenerative disc disease: Continues to have back pain that is triggered by activities.  No back pain while sitting or lying down.  Rates pain at  10/10 with movements and it is relieved with sitting or lying down.  Pain does not radiates to other locations.  Pt has been using a back brace, notes this provides the most to relief her back pain.  Takes tylenol every couple of days for the back pain, but notes little relief with the tylenol.     Review of Systems:  Review of Systems  Constitutional: Negative.   HENT: Positive for hearing loss.        HOH wears bilateral hearing aids.  Eyes: Negative.   Respiratory: Negative.   Cardiovascular: Negative.   Gastrointestinal: Negative.   Genitourinary: Negative.   Musculoskeletal: Positive for back pain. Negative for falls.  Neurological: Negative.   Psychiatric/Behavioral: Negative.     Past Medical History:  Diagnosis Date  . Anxiety   . Arthritis    Both knees R>L  . Central stenosis of spinal canal    moderate L4/5 with L4/5 and L5/S1 disc bulges  . Chronic diastolic congestive heart failure, NYHA class 2 ([place])   . Facet joint syndrome (HCC)    Left L2-3, L3-4, L4-5, L5-S1  . GERD (gastroesophageal reflux disease) 05/14/2011  . Heart murmur   . Hyperlipidemia   . Hypertension   . Left low back pain    without sacroiliac joint componet  . Lumbago   . Lumbosacral spondylosis without myelopathy   . 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 Severe aortic stenosis 12/06/2013  . Shortness of breath dyspnea   . Spinal stenosis, lumbosacral region    Past Surgical History:  Procedure Laterality Date  . CARPAL TUNNEL RELEASE Right 10/31/2015  . CATARACT EXTRACTION Bilateral   . CHOLECYSTECTOMY  2008  . EYE SURGERY    . INTRAOPERATIVE TRANSESOPHAGEAL ECHOCARDIOGRAM N/A 01/19/2014   Procedure: INTRAOPERATIVE TRANSESOPHAGEAL ECHOCARDIOGRAM;  Surgeon: Sherren Mocha, MD;  Location: [facility] OR;  Service: Open Heart Surgery;  Laterality: N/A;  . KNEE ARTHROSCOPY Bilateral    . LEFT AND RIGHT HEART CATHETERIZATION WITH CORONARY ANGIOGRAM N/A 12/08/2013   Procedure: LEFT AND RIGHT HEART CATHETERIZATION WITH CORONARY ANGIOGRAM;  Surgeon: Sanda Klein, MD;  Location: [facility] CATH LAB;  Service: Cardiovascular;  Laterality: N/A;  . REFRACTIVE SURGERY Bilateral   . REPLACEMENT TOTAL KNEE Right   . TONSILLECTOMY     70 years ago  . TOTAL KNEE ARTHROPLASTY Right 04/28/2014   Procedure: TOTAL KNEE ARTHROPLASTY;  Surgeon: Kathryne Hitch, MD;  Location: [facility];  Service: Orthopedics;  Laterality: Right;  . TRANSCATHETER AORTIC VALVE REPLACEMENT, TRANSFEMORAL N/A 01/19/2014   Procedure: TRANSCATHETER AORTIC VALVE REPLACEMENT, TRANSFEMORAL;  Surgeon: Sherren Mocha, MD;  Location: [facility];  Service: Open Heart Surgery;  Laterality: N/A;   Social History:   reports that  has never smoked. she has never used smokeless tobacco. She reports that she does not drink alcohol or use drugs.  Family History  Problem Relation Age of Onset  . Muscular dystrophy Father        Died at 56  . Stroke Mother   . Ovarian cancer Sister   . Breast cancer Sister   . Heart attack Sister        Died at 46  . Colon cancer Neg Hx   . Hypertension Neg Hx     Medications: Patient's Medications  New Prescriptions   No medications on file  Previous Medications   AMLODIPINE (NORVASC) 10 MG TABLET    Take 1 tablet (10 mg total) by mouth daily.   CARVEDILOL (COREG) 12.5 MG TABLET    TAKE ONE TABLET BY MOUTH TWICE DAILY   CHOLECALCIFEROL (VITAMIN D) 1000 UNITS TABLET    Take 1,000 Units by mouth 2 (two) times daily.    EZETIMIBE (ZETIA) 10 MG TABLET    Take 1 tablet (10 mg total) by mouth daily.   FENOFIBRATE 160 MG TABLET    Take 1 tablet (160 mg total) by mouth daily.   FLUTICASONE (FLONASE) 50 MCG/ACT NASAL SPRAY    Place 1 spray into both nostrils 2 (two) times daily as needed (congestion).   MIRABEGRON ER (MYRBETRIQ) 25 MG TB24 TABLET    Take 1 tablet (25 mg total) by mouth daily.   MIRTAZAPINE  (REMERON) 15 MG TABLET    TAKE ONE TABLET BY MOUTH ONCE DAILY AT BEDTIME   MULTIPLE VITAMINS-MINERALS (ICAPS AREDS 2) CAPS    Take 2 capsules by mouth daily.   OMEPRAZOLE (PRILOSEC) 40 MG CAPSULE    Take 1 capsule (40 mg total) by mouth daily.  Modified Medications   No medications on file  Discontinued Medications   No medications on file     Physical Exam:  Vitals:   04/17/17 0838  BP: 136/84  Pulse: 70  Temp: 98.4 F (36.9 C)  TempSrc: Oral  SpO2: 98%  Weight: 135 lb (61.2 kg)  Height: 5\' 4"  (1.626 m)   Body mass index is 23.17 kg/m.  Physical Exam  Constitutional: She is oriented to person, place, and time. She appears well-developed and well-nourished.  HENT:  Head: Normocephalic and atraumatic.  Eyes: EOM are normal. Pupils are equal, round, and reactive to light.  Neck: Normal range of motion. Neck supple.  Cardiovascular: Normal rate, regular rhythm and normal heart sounds.  Pulmonary/Chest: Effort normal and breath sounds normal.  Abdominal: Soft. Bowel sounds are normal.  Musculoskeletal: Normal range of motion.       Lumbar back: She exhibits pain.       Back:  Chronic back pain that is triggered with movements.  Neurological: She is alert and oriented to person, place, and time.  Skin: Skin is warm and dry.  Psychiatric: She has a normal mood and affect. Her behavior is normal. Judgment and thought content normal.  Vitals reviewed.   Labs reviewed: Basic Metabolic Panel: Recent Labs    09/10/16 0827 10/12/16 0941 12/18/16 0926  NA 139 139 139  K 4.4 3.9 4.7  CL 103 101 102  CO2 26 24 27   GLUCOSE 82 99 85  BUN 27* 21 25  CREATININE 1.39* 1.40* 1.40*  CALCIUM 9.8 10.2 10.1   Liver Function Tests: Recent Labs    06/04/16 0933 09/10/16 0827 10/12/16 0941 12/18/16 0926  AST 16 18 22 20   ALT 10 16 17 14   ALKPHOS 50 60 56  --   BILITOT 0.5 0.4 0.4 0.5  PROT 7.6 7.0 7.2 7.5  ALBUMIN 4.2 3.8 4.0  --    Recent Labs    10/12/16 0941    LIPASE 25  AMYLASE 45   No results for input(s): AMMONIA in the last 8760 hours. CBC: Recent Labs    06/04/16 0933 08/08/16 1142 10/12/16 0941  WBC 6.4 6.6 6.5  NEUTROABS 3,584 3,696 3,510  HGB 11.9 11.7 11.7  HCT 36.7 36.5 35.8  MCV 89.3 89.0 89.7  PLT 395 477* 384   Lipid Panel: Recent Labs    06/04/16 0933 09/10/16 0827 12/18/16 0926  CHOL 234* 211* 209*  HDL 59 51 62  LDLCALC 151* 136*  --   TRIG 122 118 113  CHOLHDL 4.0 4.1 3.4   TSH: No results for input(s): TSH in the last 8760 hours. A1C: Lab Results  Component Value Date   HGBA1C 5.4 01/15/2014     Assessment/Plan 1. Essential hypertension Controlled; Continue with current regimen DASH diet and current exercise routine encouraged - BASIC METABOLIC PANEL WITH GFR  2. CKD (chronic kidney disease) stage 3, GFR 30-59 ml/min (HCC) Ongoing;  Renal function labs continues to be noted in the abnormal range will follow up today Hydration encouraged and Encouraged to continue to avoid NSAIDS (Aleve, Advil, Motrin, Ibuprofen) medications - BASIC METABOLIC PANEL WITH GFR  3. Gastroesophageal reflux disease with esophagitis Stable; Continue current regimen with lifestyle modifications   4. Lumbar degenerative disc disease Ongoing; Continue to use back brace as needed for chronic back pain; Use OTC tylenol and OTC muscle rubs as needed for chronic back pain  5. Osteoporosis without current pathological fracture, unspecified osteoporosis type Could not afford prolia, hx of uncontrolled GERD but currently stable, willing to start fosamax and will notify if it increases GERD symptoms - alendronate (FOSAMAX) 70 MG tablet; Take 1 tablet (70 mg total) by mouth once a week. Take with a full glass of water on an empty stomach.  Dispense: 4 tablet; Refill: 0  6. OAB (overactive bladder) Controlled, Continue on myrbetriq.   7. Anxiety and depression  Stable on Remeron 15 mg qhs  Next appt: 08/15/2017, sooner if  needed  Janett Billow K. Harle Battiest  [facility] [phone] 8 am - 5 pm) [phone] (after hours)

## 2017-04-19 ENCOUNTER — Other Ambulatory Visit: Payer: Self-pay | Admitting: Internal Medicine

## 2017-07-04 ENCOUNTER — Encounter: Payer: Self-pay | Admitting: Nurse Practitioner

## 2017-07-11 ENCOUNTER — Telehealth: Payer: Self-pay | Admitting: Nurse Practitioner

## 2017-07-11 NOTE — Telephone Encounter (Signed)
I tried calling the pt's daughter, [name] to schedule AWV w/ 08/14/17 appt.  I was unable to get through on the number ending in 13, so I'm not sure if the number is listed correctly or not. VDM (DD)

## 2017-07-21 ENCOUNTER — Other Ambulatory Visit: Payer: Self-pay | Admitting: Nurse Practitioner

## 2017-08-14 ENCOUNTER — Encounter: Payer: Self-pay | Admitting: Nurse Practitioner

## 2017-08-14 ENCOUNTER — Ambulatory Visit (INDEPENDENT_AMBULATORY_CARE_PROVIDER_SITE_OTHER): Payer: Medicare Other | Admitting: Nurse Practitioner

## 2017-08-14 VITALS — BP 128/82 | HR 72 | Temp 98.4°F | Ht 64.0 in | Wt 135.6 lb

## 2017-08-14 DIAGNOSIS — M810 Age-related osteoporosis without current pathological fracture: Secondary | ICD-10-CM

## 2017-08-14 DIAGNOSIS — E785 Hyperlipidemia, unspecified: Secondary | ICD-10-CM | POA: Diagnosis not present

## 2017-08-14 DIAGNOSIS — N3281 Overactive bladder: Secondary | ICD-10-CM | POA: Diagnosis not present

## 2017-08-14 DIAGNOSIS — N183 Chronic kidney disease, stage 3 unspecified: Secondary | ICD-10-CM

## 2017-08-14 DIAGNOSIS — M5136 Other intervertebral disc degeneration, lumbar region: Secondary | ICD-10-CM

## 2017-08-14 DIAGNOSIS — K2100 Gastro-esophageal reflux disease with esophagitis, without bleeding: Secondary | ICD-10-CM

## 2017-08-14 DIAGNOSIS — I10 Essential (primary) hypertension: Secondary | ICD-10-CM

## 2017-08-14 LAB — LIPID PANEL
CHOL/HDL RATIO: 3.4 (calc) (ref <5.0)
CHOLESTEROL: 202 mg/dL — AB (ref <200)
HDL: 60 mg/dL (ref >50)
LDL Cholesterol (Calc): 121 mg/dL (calc) — ABNORMAL HIGH
Non-HDL Cholesterol (Calc): 142 mg/dL (calc) — ABNORMAL HIGH (ref <130)
Triglycerides: 104 mg/dL (ref <150)

## 2017-08-14 LAB — CBC WITH DIFFERENTIAL/PLATELET
BASOS ABS: 28 {cells}/uL (ref 0–200)
Basophils Relative: 0.5 %
EOS PCT: 2.7 %
Eosinophils Absolute: 151 cells/uL (ref 15–500)
HEMATOCRIT: 34.2 % — AB (ref 35.0–45.0)
Hemoglobin: 11.3 g/dL — ABNORMAL LOW (ref 11.7–15.5)
LYMPHS ABS: 1602 {cells}/uL (ref 850–3900)
MCH: 28.8 pg (ref 27.0–33.0)
MCHC: 33 g/dL (ref 32.0–36.0)
MCV: 87.2 fL (ref 80.0–100.0)
MPV: 10.9 fL (ref 7.5–12.5)
Monocytes Relative: 10.7 %
NEUTROS PCT: 57.5 %
Neutro Abs: 3220 cells/uL (ref 1500–7800)
PLATELETS: 326 10*3/uL (ref 140–400)
RBC: 3.92 10*6/uL (ref 3.80–5.10)
RDW: 12.9 % (ref 11.0–15.0)
TOTAL LYMPHOCYTE: 28.6 %
WBC mixed population: 599 cells/uL (ref 200–950)
WBC: 5.6 10*3/uL (ref 3.8–10.8)

## 2017-08-14 LAB — COMPREHENSIVE METABOLIC PANEL
AG RATIO: 1.2 (calc) (ref 1.0–2.5)
ALBUMIN MSPROF: 4.1 g/dL (ref 3.6–5.1)
ALKALINE PHOSPHATASE (APISO): 54 U/L (ref 33–130)
ALT: 11 U/L (ref 6–29)
AST: 15 U/L (ref 10–35)
BILIRUBIN TOTAL: 0.4 mg/dL (ref 0.2–1.2)
BUN/Creatinine Ratio: 17 (calc) (ref 6–22)
BUN: 25 mg/dL (ref 7–25)
CALCIUM: 10 mg/dL (ref 8.6–10.4)
CO2: 27 mmol/L (ref 20–32)
Chloride: 105 mmol/L (ref 98–110)
Creat: 1.45 mg/dL — ABNORMAL HIGH (ref 0.60–0.88)
GLOBULIN: 3.4 g/dL (ref 1.9–3.7)
Glucose, Bld: 92 mg/dL (ref 65–99)
POTASSIUM: 4.6 mmol/L (ref 3.5–5.3)
SODIUM: 139 mmol/L (ref 135–146)
TOTAL PROTEIN: 7.5 g/dL (ref 6.1–8.1)

## 2017-08-14 NOTE — Progress Notes (Signed)
Careteam: Patient Care Team: Sheri Chandler, NP as PCP - General (Geriatric Medicine) Milus Banister, MD as Attending Physician (Gastroenterology) Alphonsa Overall, MD as Consulting Physician (General Surgery) Sherren Mocha, MD as Consulting Physician (Cardiology) Suella Broad, MD as Consulting Physician (Physical Medicine and Rehabilitation) Dalton-Bethea, Fabio Asa, MD as Consulting Physician (Physical Medicine and Rehabilitation)  Advanced Directive information Does Patient Have a Medical Advance Directive?: Yes, Type of Advance Directive: [place];Living will  Allergies  Allergen Reactions  . Statins     pain  . Aspirin Nausea And Vomiting  . Codeine Nausea And Vomiting  . Phenytoin Nausea And Vomiting    Chief Complaint  Patient presents with  . Medical Management of Chronic Issues    Pt is being seen for a 4 month routine visit.   . Other    Daughter in room   . ACP    Pt has HCPOA and Living will     HPI: Patient is a 84 y.o. female seen in the office today for routine follow up.   HTN- stable. Controlled on coreg and norvasc.   Hyperlipidemia-conts on zetia and fenofibrate   GERD- controlled with omeprazole. Can tell if she misses a dose.   Lumbar DDD- remains sore. Nothing helps pain, takes tylenol which is not effective however she is able to maintain ADLS. 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   Osteoporosis-  Fosamax causes increase GERD, could not afford prolia when prior authorization was done last year. Not taking calcium and vit d.    Review of Systems:  Review of Systems  Constitutional: Negative for chills, fever and malaise/fatigue.  HENT: Negative for congestion.   Eyes:       Glasses  Respiratory: Negative for cough and shortness of breath.   Cardiovascular: Negative for chest pain, palpitations and leg swelling.  Gastrointestinal: Negative for abdominal pain.  Genitourinary: Positive for frequency. Negative for  dysuria.  Musculoskeletal: Negative for myalgias.  Neurological: Negative for dizziness and weakness.  Endo/Heme/Allergies: Bruises/bleeds easily.  Psychiatric/Behavioral: Negative for depression and memory loss. The patient does not have insomnia.     Past Medical History:  Diagnosis Date  . Anxiety   . Arthritis    Both knees R>L  . Central stenosis of spinal canal    moderate L4/5 with L4/5 and L5/S1 disc bulges  . Chronic diastolic congestive heart failure, NYHA class 2 ([place])   . Facet joint syndrome    Left L2-3, L3-4, L4-5, L5-S1  . GERD (gastroesophageal reflux disease) 05/14/2011  . Heart murmur   . Hyperlipidemia   . Hypertension   . Left low back pain    without sacroiliac joint componet  . Lumbago   . Lumbosacral spondylosis without myelopathy   . 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 Severe aortic stenosis 12/06/2013  . Shortness of breath dyspnea   . Spinal stenosis, lumbosacral region    Past Surgical History:  Procedure Laterality Date  . CARPAL TUNNEL RELEASE Right 10/31/2015  . CATARACT EXTRACTION Bilateral   . CHOLECYSTECTOMY  2008  . EYE SURGERY    . INTRAOPERATIVE TRANSESOPHAGEAL ECHOCARDIOGRAM N/A 01/19/2014   Procedure: INTRAOPERATIVE TRANSESOPHAGEAL ECHOCARDIOGRAM;  Surgeon: Sherren Mocha, MD;  Location: [facility] OR;  Service: Open Heart Surgery;  Laterality: N/A;  . KNEE ARTHROSCOPY Bilateral   . LEFT AND RIGHT HEART CATHETERIZATION WITH CORONARY ANGIOGRAM N/A  12/08/2013   Procedure: LEFT AND RIGHT HEART CATHETERIZATION WITH CORONARY ANGIOGRAM;  Surgeon: Sanda Klein, MD;  Location: [place] CATH LAB;  Service: Cardiovascular;  Laterality: N/A;  . REFRACTIVE SURGERY Bilateral   . REPLACEMENT TOTAL KNEE Right   . TONSILLECTOMY     70 years ago  . TOTAL KNEE ARTHROPLASTY Right 04/28/2014   Procedure: TOTAL KNEE ARTHROPLASTY;  Surgeon: Kathryne Hitch, MD;  Location: [place];  Service: Orthopedics;  Laterality: Right;  . TRANSCATHETER AORTIC VALVE REPLACEMENT, TRANSFEMORAL N/A 01/19/2014   Procedure: TRANSCATHETER AORTIC VALVE REPLACEMENT, TRANSFEMORAL;  Surgeon: Sherren Mocha, MD;  Location: [place];  Service: Open Heart Surgery;  Laterality: N/A;   Social History:   reports that she has never smoked. She has never used smokeless tobacco. She reports that she does not drink alcohol or use drugs.  Family History  Problem Relation Age of Onset  . Muscular dystrophy Father        Died at 34  . Stroke Mother   . Ovarian cancer Sister   . Breast cancer Sister   . Heart attack Sister        Died at 56  . Colon cancer Neg Hx   . Hypertension Neg Hx     Medications: Patient's Medications  New Prescriptions   No medications on file  Previous Medications   AMLODIPINE (NORVASC) 10 MG TABLET    Take 1 tablet (10 mg total) by mouth daily.   CARVEDILOL (COREG) 12.5 MG TABLET    TAKE ONE TABLET BY MOUTH TWICE DAILY   EZETIMIBE (ZETIA) 10 MG TABLET    Take 1 tablet (10 mg total) by mouth daily.   FENOFIBRATE 160 MG TABLET    Take 160 mg by mouth daily.   FLUTICASONE (FLONASE) 50 MCG/ACT NASAL SPRAY    Place 1 spray into both nostrils 2 (two) times daily as needed (congestion).   MIRABEGRON ER (MYRBETRIQ) 25 MG TB24 TABLET    Take 1 tablet (25 mg total) by mouth daily.   MIRTAZAPINE (REMERON) 15 MG TABLET    TAKE ONE TABLET BY MOUTH ONCE DAILY AT BEDTIME   MULTIPLE VITAMINS-MINERALS (ICAPS AREDS 2) CAPS    Take 2 capsules by mouth daily.   OMEPRAZOLE (PRILOSEC) 40 MG CAPSULE    Take 1 capsule (40 mg total) by mouth daily.  Modified Medications   No medications on file  Discontinued Medications   ALENDRONATE (FOSAMAX) 70 MG TABLET    Take 1 tablet (70 mg total) by mouth once a week. Take with a full glass of water on an empty stomach.   CHOLECALCIFEROL (VITAMIN D) 1000 UNITS TABLET    Take 1,000 Units by mouth 2 (two) times daily.     FENOFIBRATE 160 MG TABLET    Take 1 tablet (160 mg total) by mouth daily.   FENOFIBRATE 160 MG TABLET    TAKE 1 TABLET BY MOUTH ONCE DAILY     Physical Exam:  Vitals:   08/14/17 0842  BP: 128/82  Pulse: 72  Temp: 98.4 F (36.9 C)  TempSrc: Oral  SpO2: 95%  Weight: 135 lb 9.6 oz (61.5 kg)  Height: 5\' 4"  (1.626 m)   Body mass index is 23.28 kg/m.  Physical Exam  Constitutional: She is oriented to person, place, and time. She appears well-developed and well-nourished. No distress.  HENT:  Head: Normocephalic and atraumatic.  Mouth/Throat: Oropharynx is clear and moist. No oropharyngeal exudate.  Eyes: Pupils are equal, round, and reactive to light. EOM  are normal.  Neck: Normal range of motion.  Cardiovascular: Normal rate, regular rhythm and normal heart sounds.  Pulmonary/Chest: Effort normal and breath sounds normal.  Abdominal: Soft. Bowel sounds are normal.  Musculoskeletal: She exhibits no edema.  Neurological: She is alert and oriented to person, place, and time.  Skin: She is not diaphoretic.  Psychiatric: She has a normal mood and affect.    Labs reviewed: Basic Metabolic Panel: Recent Labs    10/12/16 0941 12/18/16 0926 04/17/17 0948  NA 139 139 140  K 3.9 4.7 4.4  CL 101 102 105  CO2 24 27 28   GLUCOSE 99 85 94  BUN 21 25 25   CREATININE 1.40* 1.40* 1.35*  CALCIUM 10.2 10.1 10.2   Liver Function Tests: Recent Labs    09/10/16 0827 10/12/16 0941 12/18/16 0926  AST 18 22 20   ALT 16 17 14   ALKPHOS 60 56  --   BILITOT 0.4 0.4 0.5  PROT 7.0 7.2 7.5  ALBUMIN 3.8 4.0  --    Recent Labs    10/12/16 0941  LIPASE 25  AMYLASE 45   No results for input(s): AMMONIA in the last 8760 hours. CBC: Recent Labs    10/12/16 0941  WBC 6.5  NEUTROABS 3,510  HGB 11.7  HCT 35.8  MCV 89.7  PLT 384   Lipid Panel: Recent Labs    09/10/16 0827 12/18/16 0926  CHOL 211* 209*  HDL 51 62  LDLCALC 136* 125*  TRIG 118 113  CHOLHDL 4.1 3.4   TSH: No  results for input(s): TSH in the last 8760 hours. A1C: Lab Results  Component Value Date   HGBA1C 5.4 01/15/2014     Assessment/Plan 1. CKD (chronic kidney disease) stage 3, GFR 30-59 ml/min (HCC) Encourage proper hydration and to avoid NSAIDS (Aleve, Advil, Motrin, Ibuprofen)  -will follow up lab today  2. Gastroesophageal reflux disease with esophagitis Stable with diet and omeprazole.   3. Essential hypertension Controlled on current regimen, to continue current medications.   4. Lumbar degenerative disc disease Ongoing, no worsening of the pain and able to maintain ADLs. Can use heat with muscle rubs after. Encouraged to stay active.   5. Osteoporosis without current pathological fracture, unspecified osteoporosis type -fosamax causes bad GERD so she could not take. Recommended to take caltrate with D 600/400 twice a day with weight bearing activity. Will get another prior auth for prolia at this time-could not afford in the past.   6. OAB (overactive bladder) Ongoing symptoms but improved on myrbetriq 25 mg daily   7. Hyperlipidemia, unspecified hyperlipidemia type -continues on fenofibrate and zetia with diet modifications, unable to tolerate statins - Lipid Panel - CMP - CBC with Differential/Platelets  Next appt: 6 months.  Carlos American. [place], [facility] [phone]

## 2017-08-14 NOTE — Patient Instructions (Addendum)
Recommended to take caltrate with D 600/400 twice a day with weight bearing activities.   To continue to stay active  Will get another prior authorizations with prolia and let you know cost

## 2017-08-15 ENCOUNTER — Ambulatory Visit: Payer: Medicare Other | Admitting: Nurse Practitioner

## 2017-08-16 ENCOUNTER — Encounter: Payer: Self-pay | Admitting: Cardiovascular Disease

## 2017-08-16 ENCOUNTER — Ambulatory Visit: Payer: Medicare Other | Admitting: Cardiovascular Disease

## 2017-08-16 VITALS — BP 136/70 | HR 71 | Ht 60.0 in | Wt 138.0 lb

## 2017-08-16 DIAGNOSIS — I10 Essential (primary) hypertension: Secondary | ICD-10-CM

## 2017-08-16 DIAGNOSIS — I2510 Atherosclerotic heart disease of native coronary artery without angina pectoris: Secondary | ICD-10-CM

## 2017-08-16 DIAGNOSIS — I350 Nonrheumatic aortic (valve) stenosis: Secondary | ICD-10-CM

## 2017-08-16 DIAGNOSIS — R55 Syncope and collapse: Secondary | ICD-10-CM

## 2017-08-16 DIAGNOSIS — I5032 Chronic diastolic (congestive) heart failure: Secondary | ICD-10-CM

## 2017-08-16 NOTE — Patient Instructions (Signed)
Medication Instructions: Dr Croitoru recommends that you continue on your current medications as directed. Please refer to the Current Medication list given to you today.  Labwork: NONE ORDERED  Testing/Procedures: 1. Echocardiogram - Your physician has requested that you have an echocardiogram. Echocardiography is a painless test that uses sound waves to create images of your heart. It provides your doctor with information about the size and shape of your heart and how well your heart's chambers and valves are working. This procedure takes approximately one hour. There are no restrictions for this procedure.  >> This will be performed at our [address] location [address], [address] [place] [address] [phone]  Follow-up: Dr Croitoru recommends that you schedule a follow-up appointment in 12 months. You will receive a reminder letter in the mail two months in advance. If you don't receive a letter, please call our office to schedule the follow-up appointment.  If you need a refill on your cardiac medications before your next appointment, please call your pharmacy. 

## 2017-08-16 NOTE — Progress Notes (Signed)
Cardiology Office Note    Date:  08/16/2017   ID:  [name], DOB February 08, 1926, MRN [id number]  PCP:  Lauree Chandler, NP  Cardiologist:   Sanda Klein, MD   Chief Complaint  Patient presents with  . Follow-up    Hypertension, hyperlipidemia, history of TAVR    History of Present Illness:  [name] is a 84 y.o. female with a history of severe aortic stenosis treated in December 2015 with TAVR (Sapien3, 23 mm), but without other serious cardiac problems except hypertension and hyperlipidemia. She had mild problems with diastolic heart failure that seem to have resolved completely following valve replacement. Cardiac catheterization in 2015 showed mild plaque in the left main coronary artery and proximal right coronary artery without significant obstruction.   Her only complaints are related to back pain.  Despite this she remains quite active.  The patient specifically denies any chest pain at rest exertion, dyspnea at rest or with exertion, orthopnea, paroxysmal nocturnal dyspnea, syncope, palpitations, focal neurological deficits, intermittent claudication, lower extremity edema, unexplained weight gain, cough, hemoptysis or wheezing.  She does not mention any of the "spells" of near syncope that she has had in the past.  She continues to live independently and drives a car.  She is accompanied here today by her daughter.    Past Medical History:  Diagnosis Date  . Anxiety   . Arthritis    Both knees R>L  . Central stenosis of spinal canal    moderate L4/5 with L4/5 and L5/S1 disc bulges  . Chronic diastolic congestive heart failure, NYHA class 2 ([place])   . Facet joint syndrome    Left L2-3, L3-4, L4-5, L5-S1  . GERD (gastroesophageal reflux disease) 05/14/2011  . Heart murmur   . Hyperlipidemia   . Hypertension   . Left low back pain    without sacroiliac joint componet  . Lumbago   . Lumbosacral spondylosis without myelopathy   . S/P TAVR (transcatheter  aortic valve replacement) 01/19/2014   23 mm Edwards Sapien 3 transcatheter heart valve placed via open right transfemoral approach  . Severe aortic stenosis 12/06/2013  . Shortness of breath dyspnea   . Spinal stenosis, lumbosacral region     Past Surgical History:  Procedure Laterality Date  . CARPAL TUNNEL RELEASE Right 10/31/2015  . CATARACT EXTRACTION Bilateral   . CHOLECYSTECTOMY  2008  . EYE SURGERY    . INTRAOPERATIVE TRANSESOPHAGEAL ECHOCARDIOGRAM N/A 01/19/2014   Procedure: INTRAOPERATIVE TRANSESOPHAGEAL ECHOCARDIOGRAM;  Surgeon: Sherren Mocha, MD;  Location: [facility] OR;  Service: Open Heart Surgery;  Laterality: N/A;  . KNEE ARTHROSCOPY Bilateral   . LEFT AND RIGHT HEART CATHETERIZATION WITH CORONARY ANGIOGRAM N/A 12/08/2013   Procedure: LEFT AND RIGHT HEART CATHETERIZATION WITH CORONARY ANGIOGRAM;  Surgeon: Sanda Klein, MD;  Location: [facility] CATH [facility];  Service: Cardiovascular;  Laterality: N/A;  . REFRACTIVE SURGERY Bilateral   . REPLACEMENT TOTAL KNEE Right   . TONSILLECTOMY     70 years ago  . TOTAL KNEE ARTHROPLASTY Right 04/28/2014   Procedure: TOTAL KNEE ARTHROPLASTY;  Surgeon: Kathryne Hitch, MD;  Location: [facility];  Service: Orthopedics;  Laterality: Right;  . TRANSCATHETER AORTIC VALVE REPLACEMENT, TRANSFEMORAL N/A 01/19/2014   Procedure: TRANSCATHETER AORTIC VALVE REPLACEMENT, TRANSFEMORAL;  Surgeon: Sherren Mocha, MD;  Location: [place];  Service: Open Heart Surgery;  Laterality: N/A;    Current Medications: Outpatient Medications Prior to Visit  Medication Sig Dispense Refill  . amLODipine (NORVASC) 10 MG tablet Take 1  tablet (10 mg total) by mouth daily. 90 tablet 1  . carvedilol (COREG) 12.5 MG tablet TAKE ONE TABLET BY MOUTH TWICE DAILY 180 tablet 3  . ezetimibe (ZETIA) 10 MG tablet Take 1 tablet (10 mg total) by mouth daily. 90 tablet 1  . fenofibrate 160 MG tablet Take 160 mg by mouth daily.    . fluticasone (FLONASE) 50 MCG/ACT nasal spray Place 1 spray into both  nostrils 2 (two) times daily as needed (congestion). 16 g 3  . mirabegron ER (MYRBETRIQ) 25 MG TB24 tablet Take 1 tablet (25 mg total) by mouth daily. 30 tablet 3  . mirtazapine (REMERON) 15 MG tablet TAKE ONE TABLET BY MOUTH ONCE DAILY AT BEDTIME 90 tablet 3  . Multiple Vitamins-Minerals (ICAPS AREDS 2) CAPS Take 2 capsules by mouth daily.    Marland Kitchen omeprazole (PRILOSEC) 40 MG capsule Take 1 capsule (40 mg total) by mouth daily. 90 capsule 3   No facility-administered medications prior to visit.      Allergies:   Statins; Aspirin; Codeine; and Phenytoin   Social History   Socioeconomic History  . Marital status: Widowed    Spouse name: Not on file  . Number of children: 3  . Years of education: Not on file  . Highest education level: Not on file  Occupational History  . Not on file  Social Needs  . Financial resource strain: Not on file  . Food insecurity:    Worry: Not on file    Inability: Not on file  . Transportation needs:    Medical: Not on file    Non-medical: Not on file  Tobacco Use  . Smoking status: Never Smoker  . Smokeless tobacco: Never Used  Substance and Sexual Activity  . Alcohol use: No  . Drug use: No  . Sexual activity: Never  Lifestyle  . Physical activity:    Days per week: Not on file    Minutes per session: Not on file  . Stress: Not on file  Relationships  . Social connections:    Talks on phone: Not on file    Gets together: Not on file    Attends religious service: Not on file    Active member of club or organization: Not on file    Attends meetings of clubs or organizations: Not on file    Relationship status: Not on file  Other Topics Concern  . Not on file  Social History Narrative   Diet?       Do you drink/eat things with caffeine? No      Marital status?         widow                           What year were you married?      Do you live in a house, apartment, assisted living, condo, trailer, etc.?  Condo       Is it one or more  stories? one      How many persons live in your home? Self      Do you have any pets in your home? (please list) no      Current or past profession: office      Do you exercise?                no  Type & how often?      Do you have a living will? yes      Do you have a DNR form?  yes                                If not, do you want to discuss one?      Do you have signed POA/HPOA for forms? yes     Family History:  [name] includes Breast cancer in her sister; Heart attack in her sister; Muscular dystrophy in her father; Ovarian cancer in her sister; Stroke in her mother.   ROS:   Please see the history of present illness.    ROS All other systems reviewed and are negative.   PHYSICAL EXAM:   VS:  BP 136/70   Pulse 71   Ht 5' (1.524 m)   Wt 138 lb (62.6 kg)   BMI 26.95 kg/m     General: Alert, oriented x3, no distress, appears well and younger than stated age Head: no evidence of trauma, PERRL, EOMI, no exophtalmos or lid lag, no myxedema, no xanthelasma; normal ears, nose and oropharynx Neck: normal jugular venous pulsations and no hepatojugular reflux; brisk carotid pulses without delay and no carotid bruits Chest: clear to auscultation, no signs of consolidation by percussion or palpation, normal fremitus, symmetrical and full respiratory excursions Cardiovascular: normal position and quality of the apical impulse, regular rhythm, normal first and second heart sounds, faint 1/6 but very musical systolic ejection murmur in the aortic focus, no diastolic murmurs, rubs or gallops Abdomen: no tenderness or distention, no masses by palpation, no abnormal pulsatility or arterial bruits, normal bowel sounds, no hepatosplenomegaly Extremities: no clubbing, cyanosis or edema; 2+ radial, ulnar and brachial pulses bilaterally; 2+ right femoral, posterior tibial and dorsalis pedis pulses; 2+ left femoral, posterior tibial and dorsalis pedis  pulses; no subclavian or femoral bruits Neurological: grossly nonfocal Psych: Normal mood and affect   Wt Readings from Last 3 Encounters:  08/16/17 138 lb (62.6 kg)  08/14/17 135 lb 9.6 oz (61.5 kg)  04/17/17 135 lb (61.2 kg)      Studies/Labs Reviewed:   EKG:  EKG is ordered today. It shows normal sinus rhythm with early R wave transition lead V2 and slightly peaked T waves in leads V2-V4, but no change from previous tracings, no repolarization abnormalities to suggest ischemia, QTC 415 ms  ASSESSMENT:    1. Severe aortic valve stenosis s/p TAVR   2. Near syncope   3. Chronic diastolic heart failure ([place])   4. Essential hypertension   5. Atherosclerosis of native coronary artery of native heart without angina pectoris      PLAN:  In order of problems listed above:  1. Chronic diastolic heart failure: Has not been an issue since TAVR.  She does not require loop diuretics 2. Near syncope: No recent events.  Holter monitor last year did not show any meaningful arrhythmia.  No indication for pacemaker. 3. AS s/p TAVR: Normal valve function by physical exam and history.  Its time for her to have a follow-up echocardiogram. 4. HTN: Acceptable control. 5. CAD: Minor by previous angiography. Currently asymptomatic.Continue statin.  She is scheduled to have her routine lab work performed next month.     Medication Adjustments/Labs and Tests Ordered: Current medicines are reviewed at length with the patient today.  Concerns regarding medicines are outlined above.  Medication  changes, Labs and Tests ordered today are listed in the Patient Instructions below. Patient Instructions  Medication Instructions: Dr Sallyanne Kuster recommends that you continue on your current medications as directed. Please refer to the Current Medication list given to you today.  Labwork: NONE ORDERED  Testing/Procedures: 1. Echocardiogram - Your physician has requested that you have an echocardiogram.  Echocardiography is a painless test that uses sound waves to create images of your heart. It provides your doctor with information about the size and shape of your heart and how well your heart's chambers and valves are working. This procedure takes approximately one hour. There are no restrictions for this procedure. >>This will be performed at our [facility] location [place], [place] [address] [phone]  Follow-up: Dr Sallyanne Kuster recommends that you schedule a follow-up appointment in 12 months. You will receive a reminder letter in the mail two months in advance. If you don't receive a letter, please call our office to schedule the follow-up appointment.  If you need a refill on your cardiac medications before your next appointment, please call your pharmacy.    Signed, Sanda Klein, MD  08/16/2017 5:05 PM    [facility] [place], [place],   [address] Phone: [phone]; Fax: [phone]

## 2017-08-19 ENCOUNTER — Telehealth: Payer: Self-pay | Admitting: Nurse Practitioner

## 2017-08-19 NOTE — Telephone Encounter (Signed)
Noted thank you

## 2017-08-19 NOTE — Telephone Encounter (Signed)
Prolia Injection.... Pt will have an out of pocket expense of  20% Injection $ 220.00 20% Admin         25.00 Pt expense        $245.00 Pts daughter([name]) says pt can not afford.  Gave daughter The assistance program telephone# [phone].  Asked daughter to call one she hears back from the program...cdavis

## 2017-08-26 ENCOUNTER — Other Ambulatory Visit: Payer: Self-pay

## 2017-08-26 ENCOUNTER — Ambulatory Visit (HOSPITAL_COMMUNITY): Payer: Medicare Other | Attending: Cardiology

## 2017-08-26 DIAGNOSIS — I110 Hypertensive heart disease with heart failure: Secondary | ICD-10-CM | POA: Diagnosis not present

## 2017-08-26 DIAGNOSIS — I081 Rheumatic disorders of both mitral and tricuspid valves: Secondary | ICD-10-CM | POA: Insufficient documentation

## 2017-08-26 DIAGNOSIS — I272 Pulmonary hypertension, unspecified: Secondary | ICD-10-CM | POA: Diagnosis not present

## 2017-08-26 DIAGNOSIS — I509 Heart failure, unspecified: Secondary | ICD-10-CM | POA: Diagnosis not present

## 2017-08-26 DIAGNOSIS — Z48812 Encounter for surgical aftercare following surgery on the circulatory system: Secondary | ICD-10-CM | POA: Insufficient documentation

## 2017-08-26 DIAGNOSIS — E785 Hyperlipidemia, unspecified: Secondary | ICD-10-CM | POA: Insufficient documentation

## 2017-08-26 DIAGNOSIS — I350 Nonrheumatic aortic (valve) stenosis: Secondary | ICD-10-CM | POA: Diagnosis not present

## 2017-08-26 DIAGNOSIS — Z952 Presence of prosthetic heart valve: Secondary | ICD-10-CM | POA: Diagnosis not present

## 2017-08-27 ENCOUNTER — Telehealth: Payer: Self-pay | Admitting: Cardiovascular Disease

## 2017-08-27 NOTE — Telephone Encounter (Signed)
Pt calling and returning call to  [place].

## 2017-08-28 NOTE — Telephone Encounter (Signed)
Returned call to daughter (okay per DPR), results reviewed.

## 2017-08-28 NOTE — Telephone Encounter (Signed)
New Message    [name] patients daughter is returning calls in reference to patients echocardiogram. Please call.

## 2017-09-04 NOTE — Telephone Encounter (Signed)
Called pt, spoke with her daughter.  Says patient was told she does not quality for assistants for the Prolia injection.  Daughter says she did not call, but every time she request assistance with other programs she was denied.  She did not want to make the call, therefore she decline the Prolia injections....  FYI to the PCP.Marland Kitchencdavis

## 2017-09-30 ENCOUNTER — Telehealth: Payer: Self-pay | Admitting: *Deleted

## 2017-09-30 ENCOUNTER — Telehealth: Payer: Self-pay | Admitting: Nurse Practitioner

## 2017-09-30 DIAGNOSIS — M4807 Spinal stenosis, lumbosacral region: Secondary | ICD-10-CM

## 2017-09-30 DIAGNOSIS — M47817 Spondylosis without myelopathy or radiculopathy, lumbosacral region: Secondary | ICD-10-CM

## 2017-09-30 DIAGNOSIS — M47816 Spondylosis without myelopathy or radiculopathy, lumbar region: Secondary | ICD-10-CM

## 2017-09-30 NOTE — Telephone Encounter (Signed)
I spoke with Erskine Squibb about AWV for her mom and aunt.  She declined AWV for them both.  She then asked about a referral for her mom to see Dr. Nicholaus Bloom at [facility]. VDM (DD)

## 2017-09-30 NOTE — Telephone Encounter (Signed)
[name], daughter called and stated that she would like a referral placed for patient to [facility]. Daughter stated she needs this referral for severe back pain. Stated that the pain shots are not working she gets from Dr. Raliegh Ip. Please Advise. ([name])

## 2017-10-01 NOTE — Telephone Encounter (Signed)
Referral was placed.  Looks like the [facility] will need to get records from [facility] b/c we do not have these records scanned in pt chart.

## 2017-10-04 ENCOUNTER — Other Ambulatory Visit: Payer: Self-pay | Admitting: Nurse Practitioner

## 2017-10-04 DIAGNOSIS — K2100 Gastro-esophageal reflux disease with esophagitis, without bleeding: Secondary | ICD-10-CM

## 2017-10-20 ENCOUNTER — Other Ambulatory Visit: Payer: Self-pay | Admitting: Nurse Practitioner

## 2017-11-13 ENCOUNTER — Telehealth: Payer: Self-pay

## 2017-11-13 NOTE — Telephone Encounter (Signed)
Ryan with [facility] called to inform [facility] that patient was accepted and he has placed a call to patient to schedule appointment, awaiting return call. I thanked Thurmond Butts for the update.   After call was ended I reviewed referral and noticed that patient has denied referral and referral closed out.   I called Ryan and left message informing him that patient refused referral and referral was closed on our end, ok to close on there end. I thanked Thurmond Butts for the follow through as well.

## 2018-01-23 ENCOUNTER — Other Ambulatory Visit: Payer: Self-pay | Admitting: *Deleted

## 2018-01-23 MED ORDER — FENOFIBRATE 160 MG PO TABS: 160.0000 mg | ORAL_TABLET | Freq: Every day | ORAL | 0 refills | Status: DC

## 2018-01-23 NOTE — Telephone Encounter (Signed)
[facility]

## 2018-02-14 ENCOUNTER — Ambulatory Visit (INDEPENDENT_AMBULATORY_CARE_PROVIDER_SITE_OTHER): Payer: Medicare Other | Admitting: Nurse Practitioner

## 2018-02-14 ENCOUNTER — Encounter: Payer: Self-pay | Admitting: Nurse Practitioner

## 2018-02-14 VITALS — BP 138/80 | HR 69 | Temp 98.2°F | Ht 60.0 in | Wt 136.0 lb

## 2018-02-14 DIAGNOSIS — I10 Essential (primary) hypertension: Secondary | ICD-10-CM

## 2018-02-14 DIAGNOSIS — M4807 Spinal stenosis, lumbosacral region: Secondary | ICD-10-CM

## 2018-02-14 DIAGNOSIS — M8008XD Age-related osteoporosis with current pathological fracture, vertebra(e), subsequent encounter for fracture with routine healing: Secondary | ICD-10-CM

## 2018-02-14 DIAGNOSIS — K2100 Gastro-esophageal reflux disease with esophagitis, without bleeding: Secondary | ICD-10-CM

## 2018-02-14 DIAGNOSIS — N183 Chronic kidney disease, stage 3 unspecified: Secondary | ICD-10-CM

## 2018-02-14 DIAGNOSIS — N3281 Overactive bladder: Secondary | ICD-10-CM

## 2018-02-14 DIAGNOSIS — E785 Hyperlipidemia, unspecified: Secondary | ICD-10-CM

## 2018-02-14 DIAGNOSIS — F419 Anxiety disorder, unspecified: Secondary | ICD-10-CM

## 2018-02-14 DIAGNOSIS — F329 Major depressive disorder, single episode, unspecified: Secondary | ICD-10-CM

## 2018-02-14 DIAGNOSIS — R413 Other amnesia: Secondary | ICD-10-CM

## 2018-02-14 DIAGNOSIS — F32A Depression, unspecified: Secondary | ICD-10-CM

## 2018-02-14 NOTE — Patient Instructions (Addendum)
Visit Prolia Patient Assistance: [email or web address]  Notify if blood pressure staying greater than 140/90 at home, take 2-3 times per week over the next few weeks.  DASH Eating Plan DASH stands for "Dietary Approaches to Stop Hypertension." The DASH eating plan is a healthy eating plan that has been shown to reduce high blood pressure (hypertension). It may also reduce your risk for type 2 diabetes, heart disease, and stroke. The DASH eating plan may also help with weight loss. What are tips for following this plan?  General guidelines  Avoid eating more than 2,300 mg (milligrams) of salt (sodium) a day. If you have hypertension, you may need to reduce your sodium intake to 1,500 mg a day.  Limit alcohol intake to no more than 1 drink a day for nonpregnant women and 2 drinks a day for men. One drink equals 12 oz of beer, 5 oz of wine, or 1 oz of hard liquor.  Work with your health care provider to maintain a healthy body weight or to lose weight. Ask what an ideal weight is for you.  Get at least 30 minutes of exercise that causes your heart to beat faster (aerobic exercise) most days of the week. Activities may include walking, swimming, or biking.  Work with your health care provider or diet and nutrition specialist (dietitian) to adjust your eating plan to your individual calorie needs. Reading food labels   Check food labels for the amount of sodium per serving. Choose foods with less than 5 percent of the Daily Value of sodium. Generally, foods with less than 300 mg of sodium per serving fit into this eating plan.  To find whole grains, look for the word "whole" as the first word in the ingredient list. Shopping  Buy products labeled as "low-sodium" or "no salt added."  Buy fresh foods. Avoid canned foods and premade or frozen meals. Cooking  Avoid adding salt when cooking. Use salt-free seasonings or herbs instead of table salt or sea salt. Check with your  health care provider or pharmacist before using salt substitutes.  Do not fry foods. Cook foods using healthy methods such as baking, boiling, grilling, and broiling instead.  Cook with heart-healthy oils, such as olive, canola, soybean, or sunflower oil. Meal planning  Eat a balanced diet that includes: ? 5 or more servings of fruits and vegetables each day. At each meal, try to fill half of your plate with fruits and vegetables. ? Up to 6-8 servings of whole grains each day. ? Less than 6 oz of lean meat, poultry, or fish each day. A 3-oz serving of meat is about the same size as a deck of cards. One egg equals 1 oz. ? 2 servings of low-fat dairy each day. ? A serving of nuts, seeds, or beans 5 times each week. ? Heart-healthy fats. Healthy fats called Omega-3 fatty acids are found in foods such as flaxseeds and coldwater fish, like sardines, salmon, and mackerel.  Limit how much you eat of the following: ? Canned or prepackaged foods. ? Food that is high in trans fat, such as fried foods. ? Food that is high in saturated fat, such as fatty meat. ? Sweets, desserts, sugary drinks, and other foods with added sugar. ? Full-fat dairy products.  Do not salt foods before eating.  Try to eat at least 2 vegetarian meals each week.  Eat more home-cooked food and less restaurant, buffet, and fast food.  When eating at a restaurant, ask that your food be  prepared with less salt or no salt, if possible. What foods are recommended? The items listed may not be a complete list. Talk with your dietitian about what dietary choices are best for you. Grains Whole-grain or whole-wheat bread. Whole-grain or whole-wheat pasta. Brown rice. [place] Morrow. Bulgur. Whole-grain and low-sodium cereals. Pita bread. Low-fat, low-sodium crackers. Whole-wheat flour tortillas. Vegetables Fresh or frozen vegetables (raw, steamed, roasted, or grilled). Low-sodium or reduced-sodium tomato and vegetable juice.  Low-sodium or reduced-sodium tomato sauce and tomato paste. Low-sodium or reduced-sodium canned vegetables. Fruits All fresh, dried, or frozen fruit. Canned fruit in natural juice (without added sugar). Meat and other protein foods Skinless chicken or [place]. Ground chicken or [place]. Pork with fat trimmed off. Fish and seafood. Egg whites. Dried beans, peas, or lentils. Unsalted nuts, nut butters, and seeds. Unsalted canned beans. Lean cuts of beef with fat trimmed off. Low-sodium, lean deli meat. Dairy Low-fat (1%) or fat-free (skim) milk. Fat-free, low-fat, or reduced-fat cheeses. Nonfat, low-sodium ricotta or cottage cheese. Low-fat or nonfat yogurt. Low-fat, low-sodium cheese. Fats and oils Soft margarine without trans fats. Vegetable oil. Low-fat, reduced-fat, or light mayonnaise and salad dressings (reduced-sodium). Canola, safflower, olive, soybean, and sunflower oils. Avocado. Seasoning and other foods Herbs. Spices. Seasoning mixes without salt. Unsalted popcorn and pretzels. Fat-free sweets. What foods are not recommended? The items listed may not be a complete list. Talk with your dietitian about what dietary choices are best for you. Grains Baked goods made with fat, such as croissants, muffins, or some breads. Dry pasta or rice meal packs. Vegetables Creamed or fried vegetables. Vegetables in a cheese sauce. Regular canned vegetables (not low-sodium or reduced-sodium). Regular canned tomato sauce and paste (not low-sodium or reduced-sodium). Regular tomato and vegetable juice (not low-sodium or reduced-sodium). Angie Fava. Olives. Fruits Canned fruit in a light or heavy syrup. Fried fruit. Fruit in cream or butter sauce. Meat and other protein foods Fatty cuts of meat. Ribs. Fried meat. Berniece Salines. Sausage. Bologna and other processed lunch meats. Salami. Fatback. Hotdogs. Bratwurst. Salted nuts and seeds. Canned beans with added salt. Canned or smoked fish. Whole eggs or egg yolks. Chicken  or [place] with skin. Dairy Whole or 2% milk, cream, and half-and-half. Whole or full-fat cream cheese. Whole-fat or sweetened yogurt. Full-fat cheese. Nondairy creamers. Whipped toppings. Processed cheese and cheese spreads. Fats and oils Butter. Stick margarine. Lard. Shortening. Ghee. Bacon fat. Tropical oils, such as coconut, palm kernel, or palm oil. Seasoning and other foods Salted popcorn and pretzels. Onion salt, garlic salt, seasoned salt, table salt, and sea salt. Worcestershire sauce. Tartar sauce. Barbecue sauce. Teriyaki sauce. Soy sauce, including reduced-sodium. Steak sauce. Canned and packaged gravies. Fish sauce. Oyster sauce. Cocktail sauce. Horseradish that you find on the shelf. Ketchup. Mustard. Meat flavorings and tenderizers. Bouillon cubes. Hot sauce and Tabasco sauce. Premade or packaged marinades. Premade or packaged taco seasonings. Relishes. Regular salad dressings. Where to find more information:  National Heart, Lung, and [place]itro: https://wilson-eaton.com/  American Heart Association: www.heart.org Summary  The DASH eating plan is a healthy eating plan that has been shown to reduce high blood pressure (hypertension). It may also reduce your risk for type 2 diabetes, heart disease, and stroke.  With the DASH eating plan, you should limit salt (sodium) intake to 2,300 mg a day. If you have hypertension, you may need to reduce your sodium intake to 1,500 mg a day.  When on the DASH eating plan, aim to eat more fresh fruits and vegetables, whole grains,  lean proteins, low-fat dairy, and heart-healthy fats.  Work with your health care provider or diet and nutrition specialist (dietitian) to adjust your eating plan to your individual calorie needs. This information is not intended to replace advice given to you by your health care provider. Make sure you discuss any questions you have with your health care provider. Document Released: 01/25/2011 Document Revised:  01/30/2016 Document Reviewed: 01/30/2016 Elsevier Interactive Patient Education  2019 [employer].

## 2018-02-14 NOTE — Progress Notes (Signed)
Careteam: Patient Care Team: Lauree Chandler, NP as PCP - General (Geriatric Medicine) Milus Banister, MD as Attending Physician (Gastroenterology) Alphonsa Overall, MD as Consulting Physician (General Surgery) Sherren Mocha, MD as Consulting Physician (Cardiology) Suella Broad, MD as Consulting Physician (Physical Medicine and Rehabilitation) Dalton-Bethea, Fabio Asa, MD as Consulting Physician (Physical Medicine and Rehabilitation)  Advanced Directive information    Allergies  Allergen Reactions  . Statins     pain  . Aspirin Nausea And Vomiting  . Codeine Nausea And Vomiting  . Phenytoin Nausea And Vomiting    Chief Complaint  Patient presents with  . Medical Management of Chronic Issues    12mth follow-up     HPI: Patient is a 84 y.o. female seen in the office today for routine follow up.    HTN- stable. Controlled on coreg and norvasc.    Hyperlipidemia-conts on zetia and fenofibrate    GERD- controlled with omeprazole.    Lumbar DDD- remains sore. Nothing helps pain, takes tylenol which is not effective however she is able to maintain ADLS. 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  Reports she has had another fracture of lumbar spine.    Osteoporosis-  Fosamax causes increase GERD, could not afford prolia when prior authorization was done last year. Not taking calcium and vit d.    Review of Systems:  Review of Systems  Constitutional: Negative for chills, fever and malaise/fatigue.  HENT: Negative for congestion.   Eyes:       Glasses  Respiratory: Negative for cough and shortness of breath.   Cardiovascular: Negative for chest pain, palpitations and leg swelling.  Gastrointestinal: Negative for abdominal pain.  Genitourinary: Negative for dysuria and frequency.  Musculoskeletal: Negative for myalgias.  Neurological: Negative for dizziness and weakness.  Endo/Heme/Allergies: Bruises/bleeds easily.  Psychiatric/Behavioral: Positive for memory loss  (per daughter). Negative for depression. The patient does not have insomnia.     Past Medical History:  Diagnosis Date  . Anxiety   . Arthritis    Both knees R>L  . Central stenosis of spinal canal    moderate L4/5 with L4/5 and L5/S1 disc bulges  . Chronic diastolic congestive heart failure, NYHA class 2 ([place])   . Facet joint syndrome    Left L2-3, L3-4, L4-5, L5-S1  . GERD (gastroesophageal reflux disease) 05/14/2011  . Heart murmur   . Hyperlipidemia   . Hypertension   . Left low back pain    without sacroiliac joint componet  . Lumbago   . Lumbosacral spondylosis without myelopathy   . 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 Severe aortic stenosis 12/06/2013  . Shortness of breath dyspnea   . Spinal stenosis, lumbosacral region    Past Surgical History:  Procedure Laterality Date  . CARPAL TUNNEL RELEASE Right 10/31/2015  . CATARACT EXTRACTION Bilateral   . CHOLECYSTECTOMY  2008  . EYE SURGERY    . INTRAOPERATIVE TRANSESOPHAGEAL ECHOCARDIOGRAM N/A 01/19/2014   Procedure: INTRAOPERATIVE TRANSESOPHAGEAL ECHOCARDIOGRAM;  Surgeon: Sherren Mocha, MD;  Location: [facility] OR;  Service: Open Heart Surgery;  Laterality: N/A;  . KNEE ARTHROSCOPY Bilateral   . LEFT AND RIGHT HEART CATHETERIZATION WITH CORONARY ANGIOGRAM N/A 12/08/2013   Procedure: LEFT AND RIGHT HEART CATHETERIZATION WITH CORONARY ANGIOGRAM;  Surgeon: Sanda Klein, MD;  Location: [place] CATH LAB;  Service: Cardiovascular;  Laterality: N/A;  . REFRACTIVE SURGERY Bilateral   .  REPLACEMENT TOTAL KNEE Right   . TONSILLECTOMY     70 years ago  . TOTAL KNEE ARTHROPLASTY Right 04/28/2014   Procedure: TOTAL KNEE ARTHROPLASTY;  Surgeon: Kathryne Hitch, MD;  Location: [place];  Service: Orthopedics;  Laterality: Right;  . TRANSCATHETER AORTIC VALVE REPLACEMENT, TRANSFEMORAL N/A 01/19/2014   Procedure: TRANSCATHETER  AORTIC VALVE REPLACEMENT, TRANSFEMORAL;  Surgeon: Sherren Mocha, MD;  Location: [place];  Service: Open Heart Surgery;  Laterality: N/A;   Social History:   reports that she has never smoked. She has never used smokeless tobacco. She reports that she does not drink alcohol or use drugs.  Family History  Problem Relation Age of Onset  . Muscular dystrophy Father        Died at 73  . Stroke Mother   . Ovarian cancer Sister   . Breast cancer Sister   . Heart attack Sister        Died at 78  . Colon cancer Neg Hx   . Hypertension Neg Hx     Medications: Patient's Medications  New Prescriptions   No medications on file  Previous Medications   AMLODIPINE (NORVASC) 10 MG TABLET    Take 1 tablet (10 mg total) by mouth daily.   CARVEDILOL (COREG) 12.5 MG TABLET    TAKE ONE TABLET BY MOUTH TWICE DAILY   EZETIMIBE (ZETIA) 10 MG TABLET    TAKE 1 TABLET BY MOUTH ONCE DAILY   FENOFIBRATE 160 MG TABLET    Take 1 tablet (160 mg total) by mouth daily.   FLUTICASONE (FLONASE) 50 MCG/ACT NASAL SPRAY    Place 1 spray into both nostrils 2 (two) times daily as needed (congestion).   MIRABEGRON ER (MYRBETRIQ) 25 MG TB24 TABLET    Take 1 tablet (25 mg total) by mouth daily.   MIRTAZAPINE (REMERON) 15 MG TABLET    TAKE ONE TABLET BY MOUTH ONCE DAILY AT BEDTIME   MULTIPLE VITAMINS-MINERALS (ICAPS AREDS 2) CAPS    Take 2 capsules by mouth daily.   OMEPRAZOLE (PRILOSEC) 40 MG CAPSULE    TAKE 1 CAPSULE BY MOUTH ONCE DAILY  Modified Medications   No medications on file  Discontinued Medications   No medications on file     Physical Exam:  Vitals:   02/14/18 0832  BP: (!) 148/70  Pulse: 69  Temp: 98.2 F (36.8 C)  TempSrc: Oral  SpO2: 93%  Weight: 136 lb (61.7 kg)  Height: 5' (1.524 m)   Body mass index is 26.56 kg/m.  Physical Exam Constitutional:      General: She is not in acute distress.    Appearance: She is well-developed. She is not diaphoretic.  HENT:     Head: Normocephalic and  atraumatic.     Mouth/Throat:     Pharynx: No oropharyngeal exudate.  Eyes:     Pupils: Pupils are equal, round, and reactive to light.  Neck:     Musculoskeletal: Normal range of motion.  Cardiovascular:     Rate and Rhythm: Normal rate and regular rhythm.     Heart sounds: Normal heart sounds.  Pulmonary:     Effort: Pulmonary effort is normal.     Breath sounds: Normal breath sounds.  Abdominal:     General: Bowel sounds are normal.     Palpations: Abdomen is soft.  Neurological:     Mental Status: She is alert and oriented to person, place, and time.    Labs reviewed: Basic Metabolic Panel: Recent Labs  04/17/17 0948 08/14/17 0933  NA 140 139  K 4.4 4.6  CL 105 105  CO2 28 27  GLUCOSE 94 92  BUN 25 25  CREATININE 1.35* 1.45*  CALCIUM 10.2 10.0   Liver Function Tests: Recent Labs    08/14/17 0933  AST 15  ALT 11  BILITOT 0.4  PROT 7.5   No results for input(s): LIPASE, AMYLASE in the last 8760 hours. No results for input(s): AMMONIA in the last 8760 hours. CBC: Recent Labs    08/14/17 0933  WBC 5.6  NEUTROABS 3,220  HGB 11.3*  HCT 34.2*  MCV 87.2  PLT 326   Lipid Panel: Recent Labs    08/14/17 0933  CHOL 202*  HDL 60  LDLCALC 121*  TRIG 104  CHOLHDL 3.4   TSH: No results for input(s): TSH in the last 8760 hours. A1C: Lab Results  Component Value Date   HGBA1C 5.4 01/15/2014     Assessment/Plan 1. CKD (chronic kidney disease) stage 3, GFR 30-59 ml/min (HCC) Continues to work on hydration, she avoids NSAIDS . - COMPLETE METABOLIC PANEL WITH GFR - CBC with Differential/Platelets  2. Gastroesophageal reflux disease with esophagitis Controlled on omeprazole  3. Essential hypertension -improved on recheck, she stays in a lot of pain due to chronic back pain which could contribute today to elevated reading. Continues on norvasc. DASh diet given. To check blood pressure at home and if remains elevated <140/90 to notify.   4.  Osteoporosis without current pathological fracture, unspecified osteoporosis type -she declined osteoporosis clinic by ortho, information provided for pt assistance program once again. Educated on importance of treatment for osteoporosis however has declined in the past. Recommended to take caltrate with D 600/400 twice a day.   5. OAB (overactive bladder) Stable on myrbetriq 25 mg daily   6. Hyperlipidemia, unspecified hyperlipidemia type -contines on zetia and fenofibrate. - Lipid panel - COMPLETE METABOLIC PANEL WITH GFR  7. Memory loss -at the end of visit pts daughter notes that memory has gotten worse. Progressive decline noted. Pt with sister of dementia.  She has not had AWV recently and therefore MMSE not up to date. Will have her follow up next month to update and this and will get labs today. May need to get CT of the head as well.  - TSH - RPR - B12 and Folate Panel  8. Lumbosacral stenosis With hx of T12 compression fracture following with Dr Ron Agee at [facility] who recommended osteoporosis clinic, pt refused at that time. She received injection to spine at last ortho appt.    9. Anxiety and depression Stable on remeron.    Next appt: next month for AWV and 6 months for routine visit with Dr Mariea Clonts . [employer]. [place], [facility] [phone]

## 2018-02-17 LAB — COMPLETE METABOLIC PANEL WITH GFR
AG RATIO: 1.2 (calc) (ref 1.0–2.5)
ALKALINE PHOSPHATASE (APISO): 59 U/L (ref 33–130)
ALT: 12 U/L (ref 6–29)
AST: 21 U/L (ref 10–35)
Albumin: 4.1 g/dL (ref 3.6–5.1)
BUN/Creatinine Ratio: 16 (calc) (ref 6–22)
BUN: 25 mg/dL (ref 7–25)
CHLORIDE: 105 mmol/L (ref 98–110)
CO2: 28 mmol/L (ref 20–32)
Calcium: 10.3 mg/dL (ref 8.6–10.4)
Creat: 1.55 mg/dL — ABNORMAL HIGH (ref 0.60–0.88)
GFR, EST NON AFRICAN AMERICAN: 29 mL/min/{1.73_m2} — AB (ref >=60)
GFR, Est African American: 34 mL/min/{1.73_m2} — ABNORMAL LOW (ref >=60)
GLOBULIN: 3.4 g/dL (ref 1.9–3.7)
Glucose, Bld: 89 mg/dL (ref 65–99)
POTASSIUM: 4.6 mmol/L (ref 3.5–5.3)
SODIUM: 140 mmol/L (ref 135–146)
Total Bilirubin: 0.4 mg/dL (ref 0.2–1.2)
Total Protein: 7.5 g/dL (ref 6.1–8.1)

## 2018-02-17 LAB — CBC WITH DIFFERENTIAL/PLATELET
Absolute Monocytes: 627 cells/uL (ref 200–950)
BASOS PCT: 0.5 %
Basophils Absolute: 28 cells/uL (ref 0–200)
EOS ABS: 151 {cells}/uL (ref 15–500)
EOS PCT: 2.7 %
HCT: 33 % — ABNORMAL LOW (ref 35.0–45.0)
Hemoglobin: 10.7 g/dL — ABNORMAL LOW (ref 11.7–15.5)
Lymphs Abs: 1557 cells/uL (ref 850–3900)
MCH: 28.2 pg (ref 27.0–33.0)
MCHC: 32.4 g/dL (ref 32.0–36.0)
MCV: 87.1 fL (ref 80.0–100.0)
MPV: 11.3 fL (ref 7.5–12.5)
Monocytes Relative: 11.2 %
Neutro Abs: 3237 cells/uL (ref 1500–7800)
Neutrophils Relative %: 57.8 %
PLATELETS: 349 10*3/uL (ref 140–400)
RBC: 3.79 10*6/uL — ABNORMAL LOW (ref 3.80–5.10)
RDW: 12.6 % (ref 11.0–15.0)
TOTAL LYMPHOCYTE: 27.8 %
WBC: 5.6 10*3/uL (ref 3.8–10.8)

## 2018-02-17 LAB — LIPID PANEL
CHOL/HDL RATIO: 3.3 (calc) (ref <5.0)
CHOLESTEROL: 202 mg/dL — AB (ref <200)
HDL: 61 mg/dL (ref >50)
LDL Cholesterol (Calc): 120 mg/dL (calc) — ABNORMAL HIGH
NON-HDL CHOLESTEROL (CALC): 141 mg/dL — AB (ref <130)
TRIGLYCERIDES: 106 mg/dL (ref <150)

## 2018-02-17 LAB — RPR: RPR Ser Ql: NONREACTIVE

## 2018-02-17 LAB — B12 AND FOLATE PANEL
FOLATE: 22.1 ng/mL
Vitamin B-12: 434 pg/mL (ref 200–1100)

## 2018-02-17 LAB — TSH: TSH: 2.31 m[IU]/L (ref 0.40–4.50)

## 2018-02-18 ENCOUNTER — Telehealth: Payer: Self-pay

## 2018-02-18 ENCOUNTER — Other Ambulatory Visit: Payer: Self-pay | Admitting: Nurse Practitioner

## 2018-02-18 ENCOUNTER — Encounter: Payer: Self-pay | Admitting: Nurse Practitioner

## 2018-02-18 DIAGNOSIS — N184 Chronic kidney disease, stage 4 (severe): Secondary | ICD-10-CM

## 2018-02-18 DIAGNOSIS — M810 Age-related osteoporosis without current pathological fracture: Secondary | ICD-10-CM | POA: Insufficient documentation

## 2018-02-18 NOTE — Telephone Encounter (Signed)
Patient's daughter called stating she spoke with Tiffany/CMA yesterday about prolia. I was unable to find documentation of this conversation. Per [name] she applied for a grant for prolia coverage and it was approved.  Fatima Sanger approved through [employer], Phone [phone]. [employer] [facility] will fax [place] an approval letter. [name] confirmed [facility] fax number and plans to contact [employer] to give them the fax number. I informed [name] once we receive fax from [employer] we will need to verify benefits again with insurance due to new year and once benefits verified patient will be contacted to schedule a nurse visit or she can get a pending appointment in January 2020

## 2018-02-20 ENCOUNTER — Telehealth: Payer: Self-pay

## 2018-02-20 NOTE — Telephone Encounter (Signed)
Patient daughter, Erskine Squibb called wanting to speak with you regarding the Prolia Coverage. She wanted to know if you have received the fax from [employer] for grant for the injection.

## 2018-02-20 NOTE — Telephone Encounter (Signed)
Spoke with [name] patient's daughter to verify if she has tried any previous osteoporosis medications. Patient has never tried any medications. Prolia would be the first.  Prolia verification in process

## 2018-02-21 NOTE — Telephone Encounter (Signed)
I spoke with [name] yesterday and informed her correspondence received. Information will be reviewed with the Prolia Rep for further processing

## 2018-02-26 MED ORDER — DENOSUMAB 60 MG/ML ~~LOC~~ SOSY: 60.0000 mg | PREFILLED_SYRINGE | SUBCUTANEOUS | 1 refills | Status: DC

## 2018-02-26 NOTE — Addendum Note (Signed)
Addended by: Rafael Bihari A on: 02/26/2018 12:10 PM   Modules accepted: Orders

## 2018-02-26 NOTE — Telephone Encounter (Signed)
Per Kathyrn Lass (referral coordinator/[employer] Prolia representative) patient will need to get medication through pharmacy since she is part of a grant program. This recommendation was provided to Lattie Haw from the local Prolia Drug Representative in our area.  I called patient's daughter and she would like rx sent to [facility]. [name] will call if she needs anything additional from [place].

## 2018-02-26 NOTE — Telephone Encounter (Signed)
Patient daughter called back and stated that [facility] outpatient pharmacy stated that the Rx would not go through.  Daughter wanted Rx faxed to [facility] instead. Stated that she will take them the paperwork for the grant. Faxed.

## 2018-03-19 ENCOUNTER — Telehealth: Payer: Self-pay

## 2018-03-19 ENCOUNTER — Ambulatory Visit (INDEPENDENT_AMBULATORY_CARE_PROVIDER_SITE_OTHER): Payer: Medicare Other | Admitting: Nurse Practitioner

## 2018-03-19 ENCOUNTER — Encounter: Payer: Self-pay | Admitting: Nurse Practitioner

## 2018-03-19 ENCOUNTER — Other Ambulatory Visit: Payer: Self-pay | Admitting: Nurse Practitioner

## 2018-03-19 VITALS — BP 146/80 | HR 74 | Temp 98.0°F | Ht 60.0 in | Wt 136.0 lb

## 2018-03-19 DIAGNOSIS — M810 Age-related osteoporosis without current pathological fracture: Secondary | ICD-10-CM | POA: Diagnosis not present

## 2018-03-19 DIAGNOSIS — Z0000 Encounter for general adult medical examination without abnormal findings: Secondary | ICD-10-CM

## 2018-03-19 MED ORDER — DENOSUMAB 60 MG/ML ~~LOC~~ SOSY
60.0000 mg | PREFILLED_SYRINGE | Freq: Once | SUBCUTANEOUS | Status: AC
  Administered 2018-03-19: 60 mg via SUBCUTANEOUS

## 2018-03-19 NOTE — Patient Instructions (Signed)
[name] , Thank you for taking time to come for your Medicare Wellness Visit. I appreciate your ongoing commitment to your health goals. Please review the following plan we discussed and let me know if I can assist you in the future.   Screening recommendations/referrals: Colonoscopy aged out Mammogram aged out Bone Density  Up to date Recommended yearly ophthalmology/optometry visit for glaucoma screening and checkup Recommended yearly dental visit for hygiene and checkup  Vaccinations: Influenza vaccine up to date Pneumococcal vaccine up to date Tdap vaccine declined Shingles vaccine up to date    Advanced directives: on file.   Conditions/risks identified: lack of mobility, fall risk.  Next appointment: 1 year AWV    Preventive Care 71 Years and Older, Female Preventive care refers to lifestyle choices and visits with your health care provider that can promote health and wellness. What does preventive care include?  A yearly physical exam. This is also called an annual well check.  Dental exams once or twice a year.  Routine eye exams. Ask your health care provider how often you should have your eyes checked.  Personal lifestyle choices, including:  Daily care of your teeth and gums.  Regular physical activity.  Eating a healthy diet.  Avoiding tobacco and drug use.  Limiting alcohol use.  Practicing safe sex.  Taking low-dose aspirin every day.  Taking vitamin and mineral supplements as recommended by your health care provider. What happens during an annual well check? The services and screenings done by your health care provider during your annual well check will depend on your age, overall health, lifestyle risk factors, and family history of disease. Counseling  Your health care provider may ask you questions about your:  Alcohol use.  Tobacco use.  Drug use.  Emotional well-being.  Home and relationship well-being.  Sexual  activity.  Eating habits.  History of falls.  Memory and ability to understand (cognition).  Work and work Statistician.  Reproductive health. Screening  You may have the following tests or measurements:  Height, weight, and BMI.  Blood pressure.  Lipid and cholesterol levels. These may be checked every 5 years, or more frequently if you are over 76 years old.  Skin check.  Lung cancer screening. You may have this screening every year starting at age 72 if you have a 30-pack-year history of smoking and currently smoke or have quit within the past 15 years.  Fecal occult blood test (FOBT) of the stool. You may have this test every year starting at age 22.  Flexible sigmoidoscopy or colonoscopy. You may have a sigmoidoscopy every 5 years or a colonoscopy every 10 years starting at age 67.  Hepatitis C blood test.  Hepatitis B blood test.  Sexually transmitted disease (STD) testing.  Diabetes screening. This is done by checking your blood sugar (glucose) after you have not eaten for a while (fasting). You may have this done every 1-3 years.  Bone density scan. This is done to screen for osteoporosis. You may have this done starting at age 54.  Mammogram. This may be done every 1-2 years. Talk to your health care provider about how often you should have regular mammograms. Talk with your health care provider about your test results, treatment options, and if necessary, the need for more tests. Vaccines  Your health care provider may recommend certain vaccines, such as:  Influenza vaccine. This is recommended every year.  Tetanus, diphtheria, and acellular pertussis (Tdap, Td) vaccine. You may need a Td booster  every 10 years.  Zoster vaccine. You may need this after age 23.  Pneumococcal 13-valent conjugate (PCV13) vaccine. One dose is recommended after age 33.  Pneumococcal polysaccharide (PPSV23) vaccine. One dose is recommended after age 39. Talk to your health care  provider about which screenings and vaccines you need and how often you need them. This information is not intended to replace advice given to you by your health care provider. Make sure you discuss any questions you have with your health care provider. Document Released: 03/04/2015 Document Revised: 10/26/2015 Document Reviewed: 12/07/2014 Elsevier Interactive Patient Education  2017 [employer] Prevention in the Home Falls can cause injuries. They can happen to people of all ages. There are many things you can do to make your home safe and to help prevent falls. What can I do on the outside of my home?  Regularly fix the edges of walkways and driveways and fix any cracks.  Remove anything that might make you trip as you walk through a door, such as a raised step or threshold.  Trim any bushes or trees on the path to your home.  Use bright outdoor lighting.  Clear any walking paths of anything that might make someone trip, such as rocks or tools.  Regularly check to see if handrails are loose or broken. Make sure that both sides of any steps have handrails.  Any raised decks and porches should have guardrails on the edges.  Have any leaves, snow, or ice cleared regularly.  Use sand or salt on walking paths during winter.  Clean up any spills in your garage right away. This includes oil or grease spills. What can I do in the bathroom?  Use night lights.  Install grab bars by the toilet and in the tub and shower. Do not use towel bars as grab bars.  Use non-skid mats or decals in the tub or shower.  If you need to sit down in the shower, use a plastic, non-slip stool.  Keep the floor dry. Clean up any water that spills on the floor as soon as it happens.  Remove soap buildup in the tub or shower regularly.  Attach bath mats securely with double-sided non-slip rug tape.  Do not have throw rugs and other things on the floor that can make you trip. What can I do in  the bedroom?  Use night lights.  Make sure that you have a light by your bed that is easy to reach.  Do not use any sheets or blankets that are too big for your bed. They should not hang down onto the floor.  Have a firm chair that has side arms. You can use this for support while you get dressed.  Do not have throw rugs and other things on the floor that can make you trip. What can I do in the kitchen?  Clean up any spills right away.  Avoid walking on wet floors.  Keep items that you use a lot in easy-to-reach places.  If you need to reach something above you, use a strong step stool that has a grab bar.  Keep electrical cords out of the way.  Do not use floor polish or wax that makes floors slippery. If you must use wax, use non-skid floor wax.  Do not have throw rugs and other things on the floor that can make you trip. What can I do with my stairs?  Do not leave any items on the stairs.  Make  sure that there are handrails on both sides of the stairs and use them. Fix handrails that are broken or loose. Make sure that handrails are as long as the stairways.  Check any carpeting to make sure that it is firmly attached to the stairs. Fix any carpet that is loose or worn.  Avoid having throw rugs at the top or bottom of the stairs. If you do have throw rugs, attach them to the floor with carpet tape.  Make sure that you have a light switch at the top of the stairs and the bottom of the stairs. If you do not have them, ask someone to add them for you. What else can I do to help prevent falls?  Wear shoes that:  Do not have high heels.  Have rubber bottoms.  Are comfortable and fit you well.  Are closed at the toe. Do not wear sandals.  If you use a stepladder:  Make sure that it is fully opened. Do not climb a closed stepladder.  Make sure that both sides of the stepladder are locked into place.  Ask someone to hold it for you, if possible.  Clearly mark and  make sure that you can see:  Any grab bars or handrails.  First and last steps.  Where the edge of each step is.  Use tools that help you move around (mobility aids) if they are needed. These include:  Canes.  Walkers.  Scooters.  Crutches.  Turn on the lights when you go into a dark area. Replace any light bulbs as soon as they burn out.  Set up your furniture so you have a clear path. Avoid moving your furniture around.  If any of your floors are uneven, fix them.  If there are any pets around you, be aware of where they are.  Review your medicines with your doctor. Some medicines can make you feel dizzy. This can increase your chance of falling. Ask your doctor what other things that you can do to help prevent falls. This information is not intended to replace advice given to you by your health care provider. Make sure you discuss any questions you have with your health care provider. Document Released: 12/02/2008 Document Revised: 07/14/2015 Document Reviewed: 03/12/2014 Elsevier Interactive Patient Education  2017 [employer].

## 2018-03-19 NOTE — Progress Notes (Signed)
Subjective:   [name] is a 85 y.o. female who presents for Medicare Annual (Subsequent) preventive examination.  Review of Systems:  Cardiac Risk Factors include: advanced age (>30men, >73 women);dyslipidemia;hypertension;sedentary lifestyle     Objective:     Vitals: BP (!) 146/80   Pulse 74   Temp 98 F (36.7 C) (Oral)   Ht 5' (1.524 m)   Wt 136 lb (61.7 kg)   SpO2 98%   BMI 26.56 kg/m   Body mass index is 26.56 kg/m.  Advanced Directives 03/19/2018 08/14/2017 01/03/2017 12/18/2016 09/17/2016 08/21/2016 08/08/2016  Does Patient Have a Medical Advance Directive? Yes Yes Yes Yes Yes Yes Yes  Type of Paramedic of [place];Living will [place];Living will [place];Living will [address];Living will [place];Living will [place];Living will [place];Living will  Does patient want to make changes to medical advance directive? No - Patient declined - No - Patient declined - - - -  Copy of [place] in Chart? Yes - validated most recent copy scanned in chart (See row information) Yes - Yes - Yes -    Tobacco Social History   Tobacco Use  Smoking Status Never Smoker  Smokeless Tobacco Never Used     Counseling given: Not Answered   Clinical Intake:  Pre-visit preparation completed: Yes  Pain : 0-10 Pain Score: 10-Worst pain ever Pain Type: Chronic pain Pain Location: Back Pain Orientation: Mid Pain Descriptors / Indicators: Constant, Pressure, Aching Pain Onset: More than a month ago Pain Frequency: Constant(while she is on her feet) Pain Relieving Factors: sitting Effect of Pain on Daily Activities: unable to stand for long periods  Pain Relieving Factors: sitting  BMI - recorded: 26.56 Nutritional Status: BMI 25 -29 Overweight Diabetes: No  How often do you need to have someone help you when you  read instructions, pamphlets, or other written materials from your doctor or pharmacy?: 2 - Rarely What is the last grade level you completed in school?: 12th grade plus business course  Interpreter Needed?: No     Past Medical History:  Diagnosis Date  . Anxiety   . Arthritis    Both knees R>L  . Central stenosis of spinal canal    moderate L4/5 with L4/5 and L5/S1 disc bulges  . Chronic diastolic congestive heart failure, NYHA class 2 ([place])   . Facet joint syndrome    Left L2-3, L3-4, L4-5, L5-S1  . GERD (gastroesophageal reflux disease) 05/14/2011  . Heart murmur   . Hyperlipidemia   . Hypertension   . Left low back pain    without sacroiliac joint componet  . Lumbago   . Lumbosacral spondylosis without myelopathy   . 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 Severe aortic stenosis 12/06/2013  . Shortness of breath dyspnea   . Spinal stenosis, lumbosacral region    Past Surgical History:  Procedure Laterality Date  . CARPAL TUNNEL RELEASE Right 10/31/2015  . CATARACT EXTRACTION Bilateral   . CHOLECYSTECTOMY  2008  . EYE SURGERY    . INTRAOPERATIVE TRANSESOPHAGEAL ECHOCARDIOGRAM N/A 01/19/2014   Procedure: INTRAOPERATIVE TRANSESOPHAGEAL ECHOCARDIOGRAM;  Surgeon: Sherren Mocha, MD;  [facility];  Service: Open Heart Surgery;  Laterality: N/A;  . KNEE ARTHROSCOPY Bilateral   . LEFT AND RIGHT HEART CATHETERIZATION WITH CORONARY ANGIOGRAM  N/A 12/08/2013   Procedure: LEFT AND RIGHT HEART CATHETERIZATION WITH CORONARY ANGIOGRAM;  Surgeon: Sanda Klein, MD;  Location: [facility];  Service: Cardiovascular;  Laterality: N/A;  . REFRACTIVE SURGERY Bilateral   . REPLACEMENT TOTAL KNEE Right   . TONSILLECTOMY     70 years ago  . TOTAL KNEE ARTHROPLASTY Right 04/28/2014   Procedure: TOTAL KNEE ARTHROPLASTY;  Surgeon: Kathryne Hitch, MD;  Location: [facility];  Service:  Orthopedics;  Laterality: Right;  . TRANSCATHETER AORTIC VALVE REPLACEMENT, TRANSFEMORAL N/A 01/19/2014   Procedure: TRANSCATHETER AORTIC VALVE REPLACEMENT, TRANSFEMORAL;  Surgeon: Sherren Mocha, MD;  Location: [place];  Service: Open Heart Surgery;  Laterality: N/A;   Family History  Problem Relation Age of Onset  . Muscular dystrophy Father        Died at 75  . Stroke Mother   . Ovarian cancer Sister   . Breast cancer Sister   . Heart attack Sister        Died at 34  . Colon cancer Neg Hx   . Hypertension Neg Hx    Social History   Socioeconomic History  . Marital status: Widowed    Spouse name: Not on file  . Number of children: 3  . Years of education: Not on file  . Highest education level: Not on file  Occupational History  . Not on file  Social Needs  . Financial resource strain: Not on file  . Food insecurity:    Worry: Not on file    Inability: Not on file  . Transportation needs:    Medical: Not on file    Non-medical: Not on file  Tobacco Use  . Smoking status: Never Smoker  . Smokeless tobacco: Never Used  Substance and Sexual Activity  . Alcohol use: No  . Drug use: No  . Sexual activity: Never  Lifestyle  . Physical activity:    Days per week: Not on file    Minutes per session: Not on file  . Stress: Not on file  Relationships  . Social connections:    Talks on phone: Not on file    Gets together: Not on file    Attends religious service: Not on file    Active member of club or organization: Not on file    Attends meetings of clubs or organizations: Not on file    Relationship status: Not on file  Other Topics Concern  . Not on file  Social History Narrative   Diet?       Do you drink/eat things with caffeine? No      Marital status?         widow                           What year were you married?      Do you live in a house, apartment, assisted living, condo, trailer, etc.?  Condo       Is it one or more stories? one      How many  persons live in your home? Self      Do you have any pets in your home? (please list) no      Current or past profession: office      Do you exercise?                no  Type & how often?      Do you have a living will? yes      Do you have a DNR form?  yes                                If not, do you want to discuss one?      Do you have signed POA/HPOA for forms? yes    Outpatient Encounter Medications as of 03/19/2018  Medication Sig  . amLODipine (NORVASC) 10 MG tablet Take 1 tablet (10 mg total) by mouth daily.  . carvedilol (COREG) 12.5 MG tablet TAKE ONE TABLET BY MOUTH TWICE DAILY  . denosumab (PROLIA) 60 MG/ML SOSY injection Inject 60 mg into the skin every 6 (six) months.  . ezetimibe (ZETIA) 10 MG tablet TAKE 1 TABLET BY MOUTH ONCE DAILY  . fenofibrate 160 MG tablet Take 1 tablet (160 mg total) by mouth daily.  . fluticasone (FLONASE) 50 MCG/ACT nasal spray Place 1 spray into both nostrils 2 (two) times daily as needed (congestion).  . mirabegron ER (MYRBETRIQ) 25 MG TB24 tablet Take 1 tablet (25 mg total) by mouth daily.  . mirtazapine (REMERON) 15 MG tablet TAKE ONE TABLET BY MOUTH ONCE DAILY AT BEDTIME  . Multiple Vitamins-Minerals (ICAPS AREDS 2) CAPS Take 2 capsules by mouth daily.  Marland Kitchen omeprazole (PRILOSEC) 40 MG capsule TAKE 1 CAPSULE BY MOUTH ONCE DAILY   Facility-Administered Encounter Medications as of 03/19/2018  Medication  . denosumab (PROLIA) injection 60 mg    Activities of Daily Living In your present state of health, do you have any difficulty performing the following activities: 03/19/2018  Hearing? Y  Comment wears hearing aid  Vision? Y  Difficulty concentrating or making decisions? Y  Comment some memory loss, trouble making decision  Walking or climbing stairs? Y  Comment due to back pain  Dressing or bathing? N  Doing errands, shopping? Y  Comment daughter helps her  Preparing Food and eating ? Y  Comment due to back  pain, able to eat without issues  Using the Toilet? N  In the past six months, have you accidently leaked urine? Y  Do you have problems with loss of bowel control? N  Managing your Medications? N  Managing your Finances? N  Housekeeping or managing your Housekeeping? N  Some recent data might be hidden    Patient Care Team: Lauree Chandler, NP as PCP - General (Geriatric Medicine) Milus Banister, MD as Attending Physician (Gastroenterology) Alphonsa Overall, MD as Consulting Physician (General Surgery) Sherren Mocha, MD as Consulting Physician (Cardiology) Suella Broad, MD as Consulting Physician (Physical Medicine and Rehabilitation) Dalton-Bethea, Fabio Asa, MD as Consulting Physician (Physical Medicine and Rehabilitation)    Assessment:   This is a routine wellness examination for Audra.  Exercise Activities and Dietary recommendations Current Exercise Habits: The patient does not participate in regular exercise at present, Exercise limited by: orthopedic condition(s)  Goals    . Exercise 3x per week (30 min per time)     Starting 12/06/15, I will attempt to continue doing the exercises from PT on a daily basis and to continue after PT is finished.     . Patient Stated     More active lifestyle        Fall Risk Fall Risk  03/19/2018 02/14/2018 08/14/2017 04/17/2017 01/03/2017  Falls in the past year? 0 0 No No No  Number  falls in past yr: 0 0 - - -  Injury with Fall? 0 0 - - -   Is the patient's home free of loose throw rugs in walkways, pet beds, electrical cords, etc?   yes      Grab bars in the bathroom? yes      Handrails on the stairs?   No stairs      Adequate lighting?   yes  Timed Get Up and Go performed: na  Depression Screen PHQ 2/9 Scores 03/19/2018 02/14/2018 04/17/2017 03/09/2016  PHQ - 2 Score 0 0 0 0     Cognitive Function MMSE - Mini Mental State Exam 03/19/2018 12/06/2015  Orientation to time 5 4  Orientation to Place 5 4  Registration 3 3    Attention/ Calculation 5 5  Recall 1 3  Language- name 2 objects 2 2  Language- repeat 1 1  Language- follow 3 step command 3 3  Language- read & follow direction 1 1  Write a sentence 1 1  Copy design 1 1  Total score 28 28        Immunization History  Administered Date(s) Administered  . Influenza, High Dose Seasonal PF 11/26/2016, 11/07/2017  . Influenza-Unspecified 11/05/2013, 10/21/2014, 10/27/2015  . Pneumococcal Conjugate-13 12/18/2016  . Pneumococcal Polysaccharide-23 11/01/2012, 12/06/2015    Qualifies for Shingles Vaccine? Yes, took shingrix   Screening Tests Health Maintenance  Topic Date Due  . TETANUS/TDAP  03/20/2023 (Originally 04/09/1944)  . MAMMOGRAM  06/19/2023 (Originally 01/15/2017)  . INFLUENZA VACCINE  Completed  . DEXA SCAN  Completed  . PNA vac Low Risk Adult  Completed    Cancer Screenings: Lung: Low Dose CT Chest recommended if Age 24-80 years, 30 pack-year currently smoking OR have quit w/in 15years. Patient does not qualify. Breast:  Up to date on Mammogram? Aged out Up to date of Bone Density/Dexa? Yes Colorectal: aged out.  Additional Screenings: : Hepatitis C Screening: declined.      Plan:      I have personally reviewed and noted the following in the patient's chart:   . Medical and social history . Use of alcohol, tobacco or illicit drugs  . Current medications and supplements . Functional ability and status . Nutritional status . Physical activity . Advanced directives . List of other physicians . Hospitalizations, surgeries, and ER visits in previous 12 months . Vitals . Screenings to include cognitive, depression, and falls . Referrals and appointments  In addition, I have reviewed and discussed with patient certain preventive protocols, quality metrics, and best practice recommendations. A written personalized care plan for preventive services as well as general preventive health recommendations were provided to  patient.     Lauree Chandler, NP  03/19/2018

## 2018-03-19 NOTE — Telephone Encounter (Signed)
I called [facility] with [employer], spoke with Caryl Pina. Per Caryl Pina patient was seen 06/28/2017. Caryl Pina will fax report

## 2018-03-19 NOTE — Telephone Encounter (Addendum)
I called [facility] on [address]  to request last eye exam, spoke with [place].  Per Theadora Rama patient was seen at the [facility] location, Theadora Rama advised for me to call [phone] to access last eye exam

## 2018-03-20 ENCOUNTER — Other Ambulatory Visit: Payer: Self-pay | Admitting: Nurse Practitioner

## 2018-03-31 ENCOUNTER — Other Ambulatory Visit: Payer: Self-pay | Admitting: Nurse Practitioner

## 2018-04-07 ENCOUNTER — Other Ambulatory Visit: Payer: Self-pay | Admitting: Nurse Practitioner

## 2018-04-07 DIAGNOSIS — R634 Abnormal weight loss: Secondary | ICD-10-CM

## 2018-04-10 ENCOUNTER — Other Ambulatory Visit: Payer: Self-pay | Admitting: Nurse Practitioner

## 2018-04-10 DIAGNOSIS — K2100 Gastro-esophageal reflux disease with esophagitis, without bleeding: Secondary | ICD-10-CM

## 2018-04-26 ENCOUNTER — Other Ambulatory Visit: Payer: Self-pay | Admitting: Nurse Practitioner

## 2018-06-15 ENCOUNTER — Encounter: Payer: Self-pay | Admitting: Nurse Practitioner

## 2018-06-23 ENCOUNTER — Other Ambulatory Visit: Payer: Self-pay | Admitting: Nurse Practitioner

## 2018-06-24 ENCOUNTER — Other Ambulatory Visit: Payer: Self-pay | Admitting: *Deleted

## 2018-06-24 MED ORDER — FLUTICASONE PROPIONATE 50 MCG/ACT NA SUSP: NASAL | 3 refills | Status: DC

## 2018-06-24 NOTE — Telephone Encounter (Signed)
Daughter requested refill.  

## 2018-07-04 ENCOUNTER — Other Ambulatory Visit: Payer: Self-pay | Admitting: Nurse Practitioner

## 2018-07-04 DIAGNOSIS — R634 Abnormal weight loss: Secondary | ICD-10-CM

## 2018-07-10 ENCOUNTER — Other Ambulatory Visit: Payer: Self-pay | Admitting: Nurse Practitioner

## 2018-07-10 DIAGNOSIS — K2100 Gastro-esophageal reflux disease with esophagitis, without bleeding: Secondary | ICD-10-CM

## 2018-07-12 ENCOUNTER — Other Ambulatory Visit: Payer: Self-pay | Admitting: Nurse Practitioner

## 2018-07-12 DIAGNOSIS — K2100 Gastro-esophageal reflux disease with esophagitis, without bleeding: Secondary | ICD-10-CM

## 2018-07-16 ENCOUNTER — Telehealth: Payer: Self-pay | Admitting: *Deleted

## 2018-07-16 ENCOUNTER — Other Ambulatory Visit: Payer: Self-pay | Admitting: Nurse Practitioner

## 2018-07-16 ENCOUNTER — Other Ambulatory Visit: Payer: Self-pay | Admitting: *Deleted

## 2018-07-16 DIAGNOSIS — K2100 Gastro-esophageal reflux disease with esophagitis, without bleeding: Secondary | ICD-10-CM

## 2018-07-16 MED ORDER — OMEPRAZOLE 40 MG PO CPDR: 40.0000 mg | DELAYED_RELEASE_CAPSULE | Freq: Every day | ORAL | 1 refills | Status: DC

## 2018-07-16 NOTE — Telephone Encounter (Signed)
A message was left, re: follow up visit. 

## 2018-07-16 NOTE — Telephone Encounter (Signed)
Daughter, [name] called and stated that the pharmacy did not receive Rx.  Refaxed.

## 2018-07-24 ENCOUNTER — Other Ambulatory Visit: Payer: Self-pay | Admitting: Nurse Practitioner

## 2018-07-26 ENCOUNTER — Other Ambulatory Visit: Payer: Self-pay | Admitting: Nurse Practitioner

## 2018-08-15 ENCOUNTER — Other Ambulatory Visit: Payer: Self-pay

## 2018-08-15 MED ORDER — DENOSUMAB 60 MG/ML ~~LOC~~ SOSY: 60.0000 mg | PREFILLED_SYRINGE | SUBCUTANEOUS | 1 refills | Status: DC

## 2018-08-15 NOTE — Telephone Encounter (Signed)
Patient's daughter called to confirm that rx for prolia will be sent to [facility]. I informed [name] that I will send rx to [facility] now and make note to pharmacist that she will pick up the week 09/22/2018

## 2018-08-21 ENCOUNTER — Ambulatory Visit: Payer: Medicare Other | Admitting: Nurse Practitioner

## 2018-09-18 ENCOUNTER — Ambulatory Visit: Payer: Medicare Other

## 2018-09-18 ENCOUNTER — Other Ambulatory Visit: Payer: Self-pay

## 2018-09-18 MED ORDER — DENOSUMAB 60 MG/ML ~~LOC~~ SOSY: 60.0000 mg | PREFILLED_SYRINGE | SUBCUTANEOUS | 0 refills | Status: DC

## 2018-09-18 NOTE — Telephone Encounter (Signed)
Per Kathyrn Lass patient is due for her Prolia injection on 09/24/2018. I called in a refill request so that the patient could pick up the prescription from the pharmacy and bring to our office to be administered.  Lattie Haw also called and left a message for the patient confirming her appointment on 8/5

## 2018-09-20 ENCOUNTER — Other Ambulatory Visit: Payer: Self-pay | Admitting: Nurse Practitioner

## 2018-09-24 ENCOUNTER — Other Ambulatory Visit: Payer: Self-pay

## 2018-09-24 ENCOUNTER — Ambulatory Visit (INDEPENDENT_AMBULATORY_CARE_PROVIDER_SITE_OTHER): Payer: Medicare Other | Admitting: Nurse Practitioner

## 2018-09-24 ENCOUNTER — Encounter: Payer: Self-pay | Admitting: Nurse Practitioner

## 2018-09-24 VITALS — BP 122/78 | HR 71 | Temp 97.8°F | Ht 60.0 in | Wt 137.0 lb

## 2018-09-24 DIAGNOSIS — F329 Major depressive disorder, single episode, unspecified: Secondary | ICD-10-CM

## 2018-09-24 DIAGNOSIS — N184 Chronic kidney disease, stage 4 (severe): Secondary | ICD-10-CM

## 2018-09-24 DIAGNOSIS — K219 Gastro-esophageal reflux disease without esophagitis: Secondary | ICD-10-CM | POA: Diagnosis not present

## 2018-09-24 DIAGNOSIS — I10 Essential (primary) hypertension: Secondary | ICD-10-CM | POA: Diagnosis not present

## 2018-09-24 DIAGNOSIS — M810 Age-related osteoporosis without current pathological fracture: Secondary | ICD-10-CM

## 2018-09-24 DIAGNOSIS — E785 Hyperlipidemia, unspecified: Secondary | ICD-10-CM

## 2018-09-24 DIAGNOSIS — F419 Anxiety disorder, unspecified: Secondary | ICD-10-CM

## 2018-09-24 DIAGNOSIS — Z0000 Encounter for general adult medical examination without abnormal findings: Secondary | ICD-10-CM | POA: Diagnosis not present

## 2018-09-24 DIAGNOSIS — M5136 Other intervertebral disc degeneration, lumbar region: Secondary | ICD-10-CM | POA: Diagnosis not present

## 2018-09-24 LAB — COMPLETE METABOLIC PANEL WITH GFR
AG Ratio: 1.2 (calc) (ref 1.0–2.5)
ALT: 15 U/L (ref 6–29)
AST: 17 U/L (ref 10–35)
Albumin: 4 g/dL (ref 3.6–5.1)
Alkaline phosphatase (APISO): 34 U/L — ABNORMAL LOW (ref 37–153)
BUN/Creatinine Ratio: 17 (calc) (ref 6–22)
BUN: 26 mg/dL — ABNORMAL HIGH (ref 7–25)
CO2: 25 mmol/L (ref 20–32)
Calcium: 9.8 mg/dL (ref 8.6–10.4)
Chloride: 107 mmol/L (ref 98–110)
Creat: 1.52 mg/dL — ABNORMAL HIGH (ref 0.60–0.88)
GFR, Est African American: 34 mL/min/{1.73_m2} — ABNORMAL LOW (ref >=60)
GFR, Est Non African American: 30 mL/min/{1.73_m2} — ABNORMAL LOW (ref >=60)
Globulin: 3.4 g/dL (calc) (ref 1.9–3.7)
Glucose, Bld: 93 mg/dL (ref 65–99)
Potassium: 4.8 mmol/L (ref 3.5–5.3)
Sodium: 141 mmol/L (ref 135–146)
Total Bilirubin: 0.4 mg/dL (ref 0.2–1.2)
Total Protein: 7.4 g/dL (ref 6.1–8.1)

## 2018-09-24 LAB — CBC WITH DIFFERENTIAL/PLATELET
Absolute Monocytes: 676 cells/uL (ref 200–950)
Basophils Absolute: 31 cells/uL (ref 0–200)
Basophils Relative: 0.5 %
Eosinophils Absolute: 186 cells/uL (ref 15–500)
Eosinophils Relative: 3 %
HCT: 34.7 % — ABNORMAL LOW (ref 35.0–45.0)
Hemoglobin: 11 g/dL — ABNORMAL LOW (ref 11.7–15.5)
Lymphs Abs: 1897 cells/uL (ref 850–3900)
MCH: 28.3 pg (ref 27.0–33.0)
MCHC: 31.7 g/dL — ABNORMAL LOW (ref 32.0–36.0)
MCV: 89.2 fL (ref 80.0–100.0)
MPV: 11.4 fL (ref 7.5–12.5)
Monocytes Relative: 10.9 %
Neutro Abs: 3410 cells/uL (ref 1500–7800)
Neutrophils Relative %: 55 %
Platelets: 334 10*3/uL (ref 140–400)
RBC: 3.89 10*6/uL (ref 3.80–5.10)
RDW: 13.4 % (ref 11.0–15.0)
Total Lymphocyte: 30.6 %
WBC: 6.2 10*3/uL (ref 3.8–10.8)

## 2018-09-24 LAB — LIPID PANEL
Cholesterol: 192 mg/dL (ref <200)
HDL: 54 mg/dL (ref >=50)
LDL Cholesterol (Calc): 116 mg/dL (calc) — ABNORMAL HIGH
Non-HDL Cholesterol (Calc): 138 mg/dL (calc) — ABNORMAL HIGH (ref <130)
Total CHOL/HDL Ratio: 3.6 (calc) (ref <5.0)
Triglycerides: 111 mg/dL (ref <150)

## 2018-09-24 MED ORDER — DENOSUMAB 60 MG/ML ~~LOC~~ SOSY
60.0000 mg | PREFILLED_SYRINGE | Freq: Once | SUBCUTANEOUS | Status: AC
  Administered 2018-09-24: 60 mg via SUBCUTANEOUS

## 2018-09-24 MED ORDER — ESOMEPRAZOLE MAGNESIUM 20 MG PO CPDR: 20.0000 mg | DELAYED_RELEASE_CAPSULE | Freq: Every day | ORAL | 1 refills | Status: DC

## 2018-09-24 NOTE — Progress Notes (Signed)
Provider: Lauree Chandler, NP  Patient Care Team: Lauree Chandler, NP as PCP - General (Geriatric Medicine) Milus Banister, MD as Attending Physician (Gastroenterology) Alphonsa Overall, MD as Consulting Physician (General Surgery) Sherren Mocha, MD as Consulting Physician (Cardiology) Suella Broad, MD as Consulting Physician (Physical Medicine and Rehabilitation) Dalton-Bethea, Fabio Asa, MD as Consulting Physician (Physical Medicine and Rehabilitation) Thalia Bloodgood, [place] as Referring Physician [facility])  Extended Emergency Contact Information Primary Emergency Contact: Johnson,[name] Address: [address]          [place], [place] [address] Johnnette Litter of [place] Phone: [phone] Relation: Daughter Secondary Emergency Contact: Hartley Barefoot" Address: [address]          [place], [place] [address] Johnnette Litter of [place] Phone: [phone] Mobile Phone: [phone] Relation: Son Allergies  Allergen Reactions  . Statins     pain  . Aspirin Nausea And Vomiting  . Codeine Nausea And Vomiting  . Phenytoin Nausea And Vomiting   Code Status: DNR Goals of Care: Advanced Directive information Advanced Directives 03/19/2018  Does Patient Have a Medical Advance Directive? Yes  Type of Paramedic of [place];Living will  Does patient want to make changes to medical advance directive? No - Patient declined  Copy of [place] in Chart? Yes - validated most recent copy scanned in chart (See row information)     Chief Complaint  Patient presents with  . Annual Exam    Yearly physical. Here with daughter [name]   . Injections    Prolia Injection   . Medication Management    Discusss remeron, patient and daughter does not feel like she needs to take medication   . Medication Management    Discuss omeprazole, ineffective. Patient would like to discuss alternative     HPI: Patient is a 85 y.o. female seen in  today for an annual wellness exam.    Does not feel like she needs remeron- was started for appetite and mood originally - feels like this is not an issue.  Does have issues with going to sleep.   GERD-ongoing issue with GERD on omeprazole. Daughter takes Nexium and would like to try this. Tried a few to see if it would work better and it does  Dentition: routinely   Ophthalmology appt: routine exam , due to [place] it has gotten behind  CKD stage 4- referral placed to nephrologist but has not seen due to [place], has to call to follow up.   OAB- continues to have to urinate frequently, better than previously, continues on myrbetriq   Depression screen [facility] 2/9 03/19/2018 02/14/2018 04/17/2017 03/09/2016 12/06/2015  Decreased Interest 0 0 0 0 0  Down, Depressed, Hopeless 0 0 0 0 0  PHQ - 2 Score 0 0 0 0 0    Fall Risk  09/24/2018 03/19/2018 02/14/2018 08/14/2017 04/17/2017  Falls in the past year? 0 0 0 No No  Number falls in past yr: 0 0 0 - -  Injury with Fall? 0 0 0 - -   MMSE - [place] Exam 03/19/2018 12/06/2015  Orientation to time 5 4  Orientation to Place 5 4  Registration 3 3  Attention/ Calculation 5 5  Recall 1 3  Language- name 2 objects 2 2  Language- repeat 1 1  Language- follow 3 step command 3 3  Language- read & follow direction 1 1  Write a sentence 1 1  Copy design 1 1  Total score 28 28     Health Maintenance  Topic Date Due  . INFLUENZA VACCINE  09/20/2018  . TETANUS/TDAP  03/20/2023 (Originally 04/09/1944)  . MAMMOGRAM  06/19/2023 (Originally 01/15/2017)  . DEXA SCAN  Completed  . PNA vac Low Risk Adult  Completed    Past Medical History:  Diagnosis Date  . Anxiety   . Arthritis    Both knees R>L  . Central stenosis of spinal canal    moderate L4/5 with L4/5 and L5/S1 disc bulges  . Chronic diastolic congestive heart failure, NYHA class 2 ([place])   . Facet joint syndrome    Left L2-3, L3-4, L4-5, L5-S1  . GERD (gastroesophageal reflux  disease) 05/14/2011  . Heart murmur   . Hyperlipidemia   . Hypertension   . Left low back pain    without sacroiliac joint componet  . Lumbago   . Lumbosacral spondylosis without myelopathy   . 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 Severe aortic stenosis 12/06/2013  . Shortness of breath dyspnea   . Spinal stenosis, lumbosacral region     Past Surgical History:  Procedure Laterality Date  . CARPAL TUNNEL RELEASE Right 10/31/2015  . CATARACT EXTRACTION Bilateral   . CHOLECYSTECTOMY  2008  . EYE SURGERY    . INTRAOPERATIVE TRANSESOPHAGEAL ECHOCARDIOGRAM N/A 01/19/2014   Procedure: INTRAOPERATIVE TRANSESOPHAGEAL ECHOCARDIOGRAM;  Surgeon: Sherren Mocha, MD;  Location: [facility] OR;  Service: Open Heart Surgery;  Laterality: N/A;  . KNEE ARTHROSCOPY Bilateral   . LEFT AND RIGHT HEART CATHETERIZATION WITH CORONARY ANGIOGRAM N/A 12/08/2013   Procedure: LEFT AND RIGHT HEART CATHETERIZATION WITH CORONARY ANGIOGRAM;  Surgeon: Sanda Klein, MD;  Location: [place] CATH LAB;  Service: Cardiovascular;  Laterality: N/A;  . REFRACTIVE SURGERY Bilateral   . REPLACEMENT TOTAL KNEE Right   . TONSILLECTOMY     70 years ago  . TOTAL KNEE ARTHROPLASTY Right 04/28/2014   Procedure: TOTAL KNEE ARTHROPLASTY;  Surgeon: Kathryne Hitch, MD;  Location: [facility];  Service: Orthopedics;  Laterality: Right;  . TRANSCATHETER AORTIC VALVE REPLACEMENT, TRANSFEMORAL N/A 01/19/2014   Procedure: TRANSCATHETER AORTIC VALVE REPLACEMENT, TRANSFEMORAL;  Surgeon: Sherren Mocha, MD;  Location: [facility];  Service: Open Heart Surgery;  Laterality: N/A;    Social History   Socioeconomic History  . Marital status: Widowed    Spouse name: Not on file  . Number of children: 3  . Years of education: Not on file  . Highest education level: Not on file  Occupational History  . Not on file  Social Needs  . Financial resource  strain: Not on file  . Food insecurity    Worry: Not on file    Inability: Not on file  . Transportation needs    Medical: Not on file    Non-medical: Not on file  Tobacco Use  . Smoking status: Never Smoker  . Smokeless tobacco: Never Used  Substance and Sexual Activity  . Alcohol use: No  . Drug use: No  . Sexual activity: Never  Lifestyle  . Physical activity    Days per week: Not on file    Minutes per session: Not on file  . Stress: Not on file  Relationships  . Social Herbalist on phone: Not on file    Gets together: Not on file    Attends religious service: Not on file    Active member  of club or organization: Not on file    Attends meetings of clubs or organizations: Not on file    Relationship status: Not on file  Other Topics Concern  . Not on file  Social History Narrative   Diet?       Do you drink/eat things with caffeine? No      Marital status?         widow                           What year were you married?      Do you live in a house, apartment, assisted living, condo, trailer, etc.?  Condo       Is it one or more stories? one      How many persons live in your home? Self      Do you have any pets in your home? (please list) no      Current or past profession: office      Do you exercise?                no                      Type & how often?      Do you have a living will? yes      Do you have a DNR form?  yes                                If not, do you want to discuss one?      Do you have signed POA/HPOA for forms? yes    Family History  Problem Relation Age of Onset  . Muscular dystrophy Father        Died at 73  . Stroke Mother   . Ovarian cancer Sister   . Breast cancer Sister   . Heart attack Sister        Died at 89  . Colon cancer Neg Hx   . Hypertension Neg Hx     Review of Systems:  Review of Systems  Constitutional: Negative for chills, fever and malaise/fatigue.  HENT: Negative for congestion.    Eyes:       Glasses  Respiratory: Negative for cough and shortness of breath.   Cardiovascular: Negative for chest pain, palpitations and leg swelling.  Gastrointestinal: Positive for heartburn. Negative for abdominal pain.  Genitourinary: Negative for dysuria and frequency.  Musculoskeletal: Positive for back pain (chronic and stable). Negative for myalgias.  Neurological: Negative for dizziness and weakness.  Endo/Heme/Allergies: Bruises/bleeds easily.  Psychiatric/Behavioral: Positive for memory loss (per daughter). Negative for depression. The patient has insomnia (lots of naps during the day).      Allergies as of 09/24/2018      Reactions   Statins    pain   Aspirin Nausea And Vomiting   Codeine Nausea And Vomiting   Phenytoin Nausea And Vomiting      Medication List       Accurate as of September 24, 2018  9:00 AM. If you have any questions, ask your nurse or doctor.        amLODipine 10 MG tablet Commonly known as: NORVASC Take 1 tablet by mouth once daily   carvedilol 12.5 MG tablet Commonly known as: COREG Take 1 tablet by mouth twice daily   denosumab 60  MG/ML Sosy injection Commonly known as: PROLIA Inject 60 mg into the skin every 6 (six) months.   ezetimibe 10 MG tablet Commonly known as: ZETIA Take 1 tablet by mouth once daily   fenofibrate 160 MG tablet Take 1 tablet by mouth once daily   fluticasone 50 MCG/ACT nasal spray Commonly known as: FLONASE Use one spray in each nostril twice daily as needed for congestion   ICaps Areds 2 Caps Take 2 capsules by mouth daily.   mirabegron ER 25 MG Tb24 tablet Commonly known as: Myrbetriq Take 1 tablet (25 mg total) by mouth daily.   mirtazapine 15 MG tablet Commonly known as: REMERON TAKE 1 TABLET BY MOUTH ONCE DAILY AT BEDTIME   omeprazole 40 MG capsule Commonly known as: PRILOSEC Take 1 capsule (40 mg total) by mouth daily.         Physical Exam: There were no vitals filed for this visit.  There is no height or weight on file to calculate BMI. Wt Readings from Last 3 Encounters:  03/19/18 136 lb (61.7 kg)  02/14/18 136 lb (61.7 kg)  08/16/17 138 lb (62.6 kg)    Physical Exam Constitutional:      General: She is not in acute distress.    Appearance: She is well-developed. She is not diaphoretic.  HENT:     Head: Normocephalic and atraumatic.     Right Ear: Tympanic membrane normal.     Left Ear: Tympanic membrane normal.     Nose: Nose normal.     Mouth/Throat:     Mouth: Mucous membranes are moist.     Pharynx: No oropharyngeal exudate.  Eyes:     Pupils: Pupils are equal, round, and reactive to light.  Neck:     Musculoskeletal: Normal range of motion.  Cardiovascular:     Rate and Rhythm: Normal rate and regular rhythm.     Heart sounds: Normal heart sounds.  Pulmonary:     Effort: Pulmonary effort is normal.     Breath sounds: Normal breath sounds.  Chest:     Breasts:        Right: Normal.        Left: Normal.  Abdominal:     General: Bowel sounds are normal.     Palpations: Abdomen is soft.  Musculoskeletal:     Left shoulder: She exhibits decreased range of motion.     Right lower leg: No edema.     Left lower leg: No edema.  Skin:    General: Skin is warm and dry.  Neurological:     General: No focal deficit present.     Mental Status: She is alert and oriented to person, place, and time.  Psychiatric:        Mood and Affect: Mood normal.        Behavior: Behavior normal.     Labs reviewed: Basic Metabolic Panel: Recent Labs    02/14/18 0916  NA 140  K 4.6  CL 105  CO2 28  GLUCOSE 89  BUN 25  CREATININE 1.55*  CALCIUM 10.3  TSH 2.31   Liver Function Tests: Recent Labs    02/14/18 0916  AST 21  ALT 12  BILITOT 0.4  PROT 7.5   No results for input(s): LIPASE, AMYLASE in the last 8760 hours. No results for input(s): AMMONIA in the last 8760 hours. CBC: Recent Labs    02/14/18 0916  WBC 5.6  NEUTROABS 3,237  HGB  10.7*  HCT 33.0*  MCV 87.1  PLT 349   Lipid Panel: Recent Labs    02/14/18 0916  CHOL 202*  HDL 61  LDLCALC 120*  TRIG 106  CHOLHDL 3.3   Lab Results  Component Value Date   HGBA1C 5.4 01/15/2014    Procedures: No results found.  Assessment/Plan 1. Osteoporosis without current pathological fracture, unspecified osteoporosis type - denosumab (PROLIA) injection 60 mg  2. DDD (degenerative disc disease), lumbar Ongoing, made lifestyle modifications to help with pain.   3. CKD (chronic kidney disease) stage 4, GFR 15-29 ml/min (HCC) -Encourage proper hydration and to avoid NSAIDS (Aleve, Advil, Motrin, Ibuprofen)  -plans to make appt with nephrologist  - CMP with eGFR([facility]) - CBC with Differential/Platelet  4. Essential hypertension Stable, continue current regimen with  Dietary modifications  5. Gastroesophageal reflux disease, esophagitis presence not specified Omeprazole not effective. To stop omprazole and start esomeprazole.  - esomeprazole (NEXIUM) 20 MG capsule; Take 1 capsule (20 mg total) by mouth daily at 12 noon.  Dispense: 90 capsule; Refill: 1  6. Hyperlipidemia, unspecified hyperlipidemia type -continues on zetia and fenofibrate  - Lipid panel  7. Anxiety and depression Improved, not currently having issues with this. Taking Remeron but plans to titrate off. Will notify if anxiety or depression worsens.   8. Preventative health care -AWV in January.  - DNR (Do Not Resuscitate)  Next appt: 6 months  K. Guthrie, [facility] [phone]

## 2018-09-24 NOTE — Patient Instructions (Addendum)
To decrease remeron to 7.5 mg (half tablet) by mouth at bedtime - take for about 7 days and then stop   To stop omeprazole Start nexium (esomeprazole) daily for acid reflux -dietary modification encouraged  Sleep at night, awake during the day.   Insomnia Insomnia is a sleep disorder that makes it difficult to fall asleep or stay asleep. Insomnia can cause fatigue, low energy, difficulty concentrating, mood swings, and poor performance at work or school. There are three different ways to classify insomnia:  Difficulty falling asleep.  Difficulty staying asleep.  Waking up too early in the morning. Any type of insomnia can be long-term (chronic) or short-term (acute). Both are common. Short-term insomnia usually lasts for three months or less. Chronic insomnia occurs at least three times a week for longer than three months. What are the causes? Insomnia may be caused by another condition, situation, or substance, such as:  Anxiety.  Certain medicines.  Gastroesophageal reflux disease (GERD) or other gastrointestinal conditions.  Asthma or other breathing conditions.  Restless legs syndrome, sleep apnea, or other sleep disorders.  Chronic pain.  Menopause.  Stroke.  Abuse of alcohol, tobacco, or illegal drugs.  Mental health conditions, such as depression.  Caffeine.  Neurological disorders, such as Alzheimer's disease.  An overactive thyroid (hyperthyroidism). Sometimes, the cause of insomnia may not be known. What increases the risk? Risk factors for insomnia include:  Gender. Women are affected more often than men.  Age. Insomnia is more common as you get older.  Stress.  Lack of exercise.  Irregular work schedule or working night shifts.  Traveling between different time zones.  Certain medical and mental health conditions. What are the signs or symptoms? If you have insomnia, the main symptom is having trouble falling asleep or having trouble staying  asleep. This may lead to other symptoms, such as:  Feeling fatigued or having low energy.  Feeling nervous about going to sleep.  Not feeling rested in the morning.  Having trouble concentrating.  Feeling irritable, anxious, or depressed. How is this diagnosed? This condition may be diagnosed based on:  Your symptoms and medical history. Your health care provider may ask about: ? Your sleep habits. ? Any medical conditions you have. ? Your mental health.  A physical exam. How is this treated? Treatment for insomnia depends on the cause. Treatment may focus on treating an underlying condition that is causing insomnia. Treatment may also include:  Medicines to help you sleep.  Counseling or therapy.  Lifestyle adjustments to help you sleep better. Follow these instructions at home: Eating and drinking   Limit or avoid alcohol, caffeinated beverages, and cigarettes, especially close to bedtime. These can disrupt your sleep.  Do not eat a large meal or eat spicy foods right before bedtime. This can lead to digestive discomfort that can make it hard for you to sleep. Sleep habits   Keep a sleep diary to help you and your health care provider figure out what could be causing your insomnia. Write down: ? When you sleep. ? When you wake up during the night. ? How well you sleep. ? How rested you feel the next day. ? Any side effects of medicines you are taking. ? What you eat and drink.  Make your bedroom a dark, comfortable place where it is easy to fall asleep. ? Put up shades or blackout curtains to block light from outside. ? Use a white noise machine to block noise. ? Keep the temperature cool.  Limit screen use before bedtime. This includes: ? Watching TV. ? Using your smartphone, tablet, or computer.  Stick to a routine that includes going to bed and waking up at the same times every day and night. This can help you fall asleep faster. Consider making a quiet  activity, such as reading, part of your nighttime routine.  Try to avoid taking naps during the day so that you sleep better at night.  Get out of bed if you are still awake after 15 minutes of trying to sleep. Keep the lights down, but try reading or doing a quiet activity. When you feel sleepy, go back to bed. General instructions  Take over-the-counter and prescription medicines only as told by your health care provider.  Exercise regularly, as told by your health care provider. Avoid exercise starting several hours before bedtime.  Use relaxation techniques to manage stress. Ask your health care provider to suggest some techniques that may work well for you. These may include: ? Breathing exercises. ? Routines to release muscle tension. ? Visualizing peaceful scenes.  Make sure that you drive carefully. Avoid driving if you feel very sleepy.  Keep all follow-up visits as told by your health care provider. This is important. Contact a health care provider if:  You are tired throughout the day.  You have trouble in your daily routine due to sleepiness.  You continue to have sleep problems, or your sleep problems get worse. Get help right away if:  You have serious thoughts about hurting yourself or someone else. If you ever feel like you may hurt yourself or others, or have thoughts about taking your own life, get help right away. You can go to your nearest emergency department or call:  Your local emergency services (911 in the [place]  A suicide crisis helpline, such as the [facility] at 605-260-1812. This is open 24 hours a day. Summary  Insomnia is a sleep disorder that makes it difficult to fall asleep or stay asleep.  Insomnia can be long-term (chronic) or short-term (acute).  Treatment for insomnia depends on the cause. Treatment may focus on treating an underlying condition that is causing insomnia.  Keep a sleep diary to help you and  your health care provider figure out what could be causing your insomnia. This information is not intended to replace advice given to you by your health care provider. Make sure you discuss any questions you have with your health care provider. Document Released: 02/03/2000 Document Revised: 01/18/2017 Document Reviewed: 11/15/2016 Elsevier Patient Education  2020 [employer].

## 2018-09-29 ENCOUNTER — Telehealth: Payer: Self-pay | Admitting: *Deleted

## 2018-09-29 MED ORDER — PANTOPRAZOLE SODIUM 40 MG PO TBEC: 40.0000 mg | DELAYED_RELEASE_TABLET | Freq: Every day | ORAL | 3 refills | Status: DC

## 2018-09-29 NOTE — Telephone Encounter (Signed)
Patient daughter notified and agreed.  

## 2018-09-29 NOTE — Telephone Encounter (Signed)
Order sent for protonix 40 mg daily

## 2018-09-29 NOTE — Telephone Encounter (Signed)
Patient daughter, [name] called and stated that patient's acid reflux medication was changed to Nexium. Daughter stated that they went to pick up Rx and it was $154 and they cannot afford it. Wants to know if you will send in something else. Please Advise.

## 2018-10-24 ENCOUNTER — Telehealth: Payer: Medicare Other | Admitting: Cardiovascular Disease

## 2018-11-13 ENCOUNTER — Other Ambulatory Visit: Payer: Self-pay | Admitting: Internal Medicine

## 2018-11-13 DIAGNOSIS — N184 Chronic kidney disease, stage 4 (severe): Secondary | ICD-10-CM

## 2018-11-17 ENCOUNTER — Telehealth: Payer: Self-pay | Admitting: Nurse Practitioner

## 2018-11-17 NOTE — Telephone Encounter (Signed)
Can you please triage this? I have no appt times available today and she needs an OV

## 2018-11-17 NOTE — Telephone Encounter (Signed)
Pt hasn't been able to keep food down for about a wk(random). She's been feeling  Bad for approx 2 wks & daughter wants you to call her bc the appts I offered she couldn't make & only wants to see you.   Daughter declined televisit bc mom can't hear.   Thanks, Vilinda Blanks.

## 2018-11-17 NOTE — Telephone Encounter (Signed)
Spoke with daughter, [name] and she stated that she will call back to schedule an appointment with Dinah. Stated that she did not have her Calendar in front of her to schedule and she could not bring her in today.

## 2018-11-19 ENCOUNTER — Ambulatory Visit (INDEPENDENT_AMBULATORY_CARE_PROVIDER_SITE_OTHER): Payer: Medicare Other | Admitting: Family

## 2018-11-19 ENCOUNTER — Encounter: Payer: Self-pay | Admitting: Family

## 2018-11-19 ENCOUNTER — Other Ambulatory Visit: Payer: Self-pay

## 2018-11-19 VITALS — BP 138/78 | HR 68 | Temp 98.1°F | Resp 18 | Ht 60.0 in | Wt 135.6 lb

## 2018-11-19 DIAGNOSIS — K219 Gastro-esophageal reflux disease without esophagitis: Secondary | ICD-10-CM

## 2018-11-19 DIAGNOSIS — Z23 Encounter for immunization: Secondary | ICD-10-CM | POA: Diagnosis not present

## 2018-11-19 DIAGNOSIS — R110 Nausea: Secondary | ICD-10-CM

## 2018-11-19 MED ORDER — TETANUS-DIPHTH-ACELL PERTUSSIS 5-2.5-18.5 LF-MCG/0.5 IM SUSP: 0.5000 mL | Freq: Once | INTRAMUSCULAR | 0 refills | Status: AC

## 2018-11-19 MED ORDER — PANTOPRAZOLE SODIUM 40 MG PO TBEC: 40.0000 mg | DELAYED_RELEASE_TABLET | Freq: Two times a day (BID) | ORAL | 3 refills | Status: DC

## 2018-11-19 MED ORDER — ONDANSETRON HCL 4 MG PO TABS: 4.0000 mg | ORAL_TABLET | Freq: Three times a day (TID) | ORAL | 0 refills | Status: AC | PRN

## 2018-11-19 NOTE — Patient Instructions (Signed)

## 2018-11-19 NOTE — Progress Notes (Signed)
Provider:   FNP-C  Lauree Chandler, NP  Patient Care Team: Lauree Chandler, NP as PCP - General (Geriatric Medicine) Milus Banister, MD as Attending Physician (Gastroenterology) Alphonsa Overall, MD as Consulting Physician (General Surgery) Sherren Mocha, MD as Consulting Physician (Cardiology) Suella Broad, MD as Consulting Physician (Physical Medicine and Rehabilitation) Dalton-Bethea, Fabio Asa, MD as Consulting Physician (Physical Medicine and Rehabilitation) Thalia Bloodgood, [place] as Referring Physician [facility])  Extended Emergency Contact Information Primary Emergency Contact: Johnson,[name] Address: [address]          [place], [place] [address] Johnnette Litter of [place] Phone: [phone] Relation: Daughter Secondary Emergency Contact: Hartley Barefoot" Address: [address]          [place], [place] [address] Johnnette Litter of [place] Phone: [phone] Mobile Phone: [phone] Relation: Son  Code Status:  DNR Goals of care: Advanced Directive information Advanced Directives 09/24/2018  Does Patient Have a Medical Advance Directive? Yes  Type of Paramedic of [place];Living will  Does patient want to make changes to medical advance directive? No - Patient declined  Copy of [place] in Chart? Yes - validated most recent copy scanned in chart (See row information)     Chief Complaint  Patient presents with  . Acute Visit    C/o- reflux/indigestion x three weeks, nauseated, to feel like eating , bile tasting mucous in mouth    HPI:  Pt is a 85 y.o. female seen today for an acute visit for complains of  reflux/indigestion x three weeks.Also feels nauseated and spitting a lot.she does not feel like eating.some foods seems to aggravate the symptoms.she drinks non caffinated coffee.she has had some bile tasting mucous in mouth.she denies any chest pain,abdominal pain or constipation.she has taken  esomeprazole 20 mg tablet and Protonix daily without any relief.she states was able to eat sphaghetti last night without any problems.she took medication on an empty stomach this morning since she was coming to the office.   Past Medical History:  Diagnosis Date  . Anxiety   . Arthritis    Both knees R>L  . Central stenosis of spinal canal    moderate L4/5 with L4/5 and L5/S1 disc bulges  . Chronic diastolic congestive heart failure, NYHA class 2 ([place])   . Facet joint syndrome    Left L2-3, L3-4, L4-5, L5-S1  . GERD (gastroesophageal reflux disease) 05/14/2011  . Heart murmur   . Hyperlipidemia   . Hypertension   . Left low back pain    without sacroiliac joint componet  . Lumbago   . Lumbosacral spondylosis without myelopathy   . 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 Severe aortic stenosis 12/06/2013  . Shortness of breath dyspnea   . Spinal stenosis, lumbosacral region    Past Surgical History:  Procedure Laterality Date  . CARPAL TUNNEL RELEASE Right 10/31/2015  . CATARACT EXTRACTION Bilateral   . CHOLECYSTECTOMY  2008  . EYE SURGERY    . INTRAOPERATIVE TRANSESOPHAGEAL ECHOCARDIOGRAM N/A 01/19/2014   Procedure: INTRAOPERATIVE TRANSESOPHAGEAL ECHOCARDIOGRAM;  Surgeon: Sherren Mocha, MD;  Location[facility] OR;  Service: Open Heart Surgery;  Laterality: N/A;  . KNEE ARTHROSCOPY Bilateral   . LEFT AND RIGHT HEART CATHETERIZATION WITH CORONARY ANGIOGRAM N/A 12/08/2013   Procedure: LEFT AND RIGHT HEART CATHETERIZATION WITH CORONARY ANGIOGRAM;  Surgeon: Sanda Klein, MD;  Location: [place] CATH LAB;  Service: Cardiovascular;  Laterality: N/A;  . REFRACTIVE SURGERY Bilateral   . REPLACEMENT TOTAL KNEE Right   . TONSILLECTOMY     70 years ago  . TOTAL KNEE ARTHROPLASTY Right 04/28/2014   Procedure: TOTAL KNEE ARTHROPLASTY;  Surgeon: Kathryne Hitch, MD;  Location: [place];   Service: Orthopedics;  Laterality: Right;  . TRANSCATHETER AORTIC VALVE REPLACEMENT, TRANSFEMORAL N/A 01/19/2014   Procedure: TRANSCATHETER AORTIC VALVE REPLACEMENT, TRANSFEMORAL;  Surgeon: Sherren Mocha, MD;  Location: [place];  Service: Open Heart Surgery;  Laterality: N/A;    Allergies  Allergen Reactions  . Statins     pain  . Aspirin Nausea And Vomiting  . Codeine Nausea And Vomiting  . Phenytoin Nausea And Vomiting    Outpatient Encounter Medications as of 11/19/2018  Medication Sig  . amLODipine (NORVASC) 10 MG tablet Take 1 tablet by mouth once daily  . carvedilol (COREG) 12.5 MG tablet Take 1 tablet by mouth twice daily  . denosumab (PROLIA) 60 MG/ML SOSY injection Inject 60 mg into the skin every 6 (six) months.  . esomeprazole (NEXIUM) 20 MG capsule Take 1 capsule (20 mg total) by mouth daily at 12 noon.  . ezetimibe (ZETIA) 10 MG tablet Take 1 tablet by mouth once daily  . fenofibrate 160 MG tablet Take 1 tablet by mouth once daily  . fluticasone (FLONASE) 50 MCG/ACT nasal spray Use one spray in each nostril twice daily as needed for congestion  . mirabegron ER (MYRBETRIQ) 25 MG TB24 tablet Take 1 tablet (25 mg total) by mouth daily.  . Multiple Vitamins-Minerals (ICAPS AREDS 2) CAPS Take 2 capsules by mouth daily.  . pantoprazole (PROTONIX) 40 MG tablet Take 1 tablet (40 mg total) by mouth daily.  . Tdap (BOOSTRIX) 5-2.5-18.5 LF-MCG/0.5 injection Inject 0.5 mLs into the muscle once for 1 dose.  . [DISCONTINUED] Tdap (BOOSTRIX) 5-2.5-18.5 LF-MCG/0.5 injection Inject 0.5 mLs into the muscle once.   No facility-administered encounter medications on file as of 11/19/2018.     Review of Systems  Constitutional: Negative for appetite change, chills, fatigue and fever.  HENT: Positive for hearing loss. Negative for rhinorrhea, sinus pressure, sinus pain, sneezing and sore throat.   Eyes: Negative for discharge, redness and itching.  Respiratory: Negative for cough, chest  tightness, shortness of breath and wheezing.   Cardiovascular: Negative for chest pain, palpitations and leg swelling.  Gastrointestinal: Negative for abdominal distention, abdominal pain, constipation, diarrhea, nausea and vomiting.  Genitourinary: Negative for difficulty urinating, dysuria, flank pain, frequency and urgency.  Musculoskeletal: Positive for arthralgias and back pain.  Skin: Negative for color change, pallor and rash.  Neurological: Negative for dizziness, light-headedness and headaches.    Immunization History  Administered Date(s) Administered  . Influenza, High Dose Seasonal PF 11/26/2016, 11/07/2017  . Influenza-Unspecified 11/05/2013, 10/21/2014, 10/27/2015  . Pneumococcal Conjugate-13 12/18/2016  . Pneumococcal Polysaccharide-23 11/01/2012, 12/06/2015   Pertinent  Health Maintenance Due  Topic Date Due  . MAMMOGRAM  06/19/2023 (Originally 01/15/2017)  . INFLUENZA VACCINE  Completed  . DEXA SCAN  Completed  . PNA vac Low Risk Adult  Completed   Fall Risk  11/19/2018 09/24/2018 03/19/2018 02/14/2018 08/14/2017  Falls in the past year? 0 0 0 0 No  Number falls in past yr: 0 0 0 0 -  Injury with Fall? - 0 0 0 -    Vitals:   11/19/18 0855  BP: 138/78  Pulse: 68  Resp: 18  Temp: 98.1 F (36.7 C)  SpO2: 96%  Weight: 135  lb 9.6 oz (61.5 kg)  Height: 5' (1.524 m)   Body mass index is 26.48 kg/m. Physical Exam  Labs reviewed: Recent Labs    02/14/18 0916 09/24/18 0954  NA 140 141  K 4.6 4.8  CL 105 107  CO2 28 25  GLUCOSE 89 93  BUN 25 26*  CREATININE 1.55* 1.52*  CALCIUM 10.3 9.8   Recent Labs    02/14/18 0916 09/24/18 0954  AST 21 17  ALT 12 15  BILITOT 0.4 0.4  PROT 7.5 7.4   Recent Labs    02/14/18 0916 09/24/18 0954  WBC 5.6 6.2  NEUTROABS 3,237 3,410  HGB 10.7* 11.0*  HCT 33.0* 34.7*  MCV 87.1 89.2  PLT 349 334   Lab Results  Component Value Date   TSH 2.31 02/14/2018   Lab Results  Component Value Date   HGBA1C 5.4  01/15/2014   Lab Results  Component Value Date   CHOL 192 09/24/2018   HDL 54 09/24/2018   LDLCALC 116 (H) 09/24/2018   TRIG 111 09/24/2018   CHOLHDL 3.6 09/24/2018    Significant Diagnostic Results in last 30 days:  No results found.  Assessment/Plan 1. Need for tetanus booster Afebrile.No URI's symptoms.Tdap send to pharmacy. - Tdap (BOOSTRIX) 5-2.5-18.5 LF-MCG/0.5 injection; Inject 0.5 mLs into the muscle once for 1 dose.  Dispense: 0.5 mL; Refill: 0   2. Gastroesophageal reflux disease, esophagitis presence not specified Worsening symptoms.Encouraged to avoid foods that aggravate symptoms.Information provided on AVS this visit. - pantoprazole (PROTONIX) 40 MG tablet; Take 1 tablet (40 mg total) by mouth 2 (two) times daily.  Dispense: 30 tablet; Refill: 3 - Ambulatory referral to [facility] at [facility] per patient's daughter request    3. Nausea No vomiting reported.Just spitting this could be due to her acid reflux.will try Zofran as needed to help keep her meals down.  - ondansetron (ZOFRAN) 4 MG tablet; Take 1 tablet (4 mg total) by mouth every 8 (eight) hours as needed for nausea.  Dispense: 20 tablet; Refill: 0   Family/ staff Communication: Reviewed plan of care with patient and daughter.  Labs/tests ordered: None    C , NP

## 2018-12-11 ENCOUNTER — Other Ambulatory Visit: Payer: Self-pay

## 2018-12-11 ENCOUNTER — Encounter: Payer: Self-pay | Admitting: Cardiovascular Disease

## 2018-12-11 ENCOUNTER — Ambulatory Visit: Payer: Medicare Other | Admitting: Cardiovascular Disease

## 2018-12-11 VITALS — BP 138/78 | HR 74 | Ht 60.0 in | Wt 135.0 lb

## 2018-12-11 DIAGNOSIS — I10 Essential (primary) hypertension: Secondary | ICD-10-CM | POA: Diagnosis not present

## 2018-12-11 DIAGNOSIS — R55 Syncope and collapse: Secondary | ICD-10-CM | POA: Diagnosis not present

## 2018-12-11 DIAGNOSIS — I5032 Chronic diastolic (congestive) heart failure: Secondary | ICD-10-CM

## 2018-12-11 DIAGNOSIS — I2510 Atherosclerotic heart disease of native coronary artery without angina pectoris: Secondary | ICD-10-CM

## 2018-12-11 DIAGNOSIS — Z952 Presence of prosthetic heart valve: Secondary | ICD-10-CM

## 2018-12-11 NOTE — Progress Notes (Signed)
Cardiology Office Note    Date:  12/17/2018   ID:  Jakyria, Bleau [birth year], MRN [id number]  PCP:  Lauree Chandler, NP  Cardiologist:   Sanda Klein, MD   Chief Complaint  Patient presents with  . Cardiac Valve Problem    severe AS s/p TAVR 2015  . Congestive Heart Failure    diastolic    History of Present Illness:  [name] is a 85 y.o. female with a history of severe aortic stenosis treated in December 2015 with TAVR (Sapien3, 23 mm), but without other serious cardiac problems except hypertension and hyperlipidemia. She had mild problems with diastolic heart failure that seem to have resolved completely following valve replacement. Cardiac catheterization in 2015 showed mild plaque in the left main coronary artery and proximal right coronary artery without significant obstruction.   She has no CV complaints. The patient specifically denies any chest pain at rest exertion, dyspnea at rest or with exertion, orthopnea, paroxysmal nocturnal dyspnea, syncope, palpitations, focal neurological deficits, intermittent claudication, lower extremity edema, unexplained weight gain, cough, hemoptysis or wheezing.  She has problems with swallowing.  Food often gets stuck in the middle of her chest and causes a feeling of indigestion.  Makes her blood pressure go up.  She sometimes regurgitates.  She is scheduled to see Dr.Magod.  She has a longstanding history of gastroesophageal reflux, but the Protonix does not seem to help anymore.  She has not had problems with congestive heart failure: With spells of near syncope since she had the TAVR procedure.  She does not mention any of the "spells" of near syncope that she has had in the past.   Her aortic valve prosthesis was functioning normally on the echo performed last year.  Doppler suggested pseudonormal filling/elevated mean left atrial pressure, but clinically she appears euvolemic and did not have dyspnea.  She is accompanied  here today by her daughter.  Past Medical History:  Diagnosis Date  . Anxiety   . Arthritis    Both knees R>L  . Central stenosis of spinal canal    moderate L4/5 with L4/5 and L5/S1 disc bulges  . Chronic diastolic congestive heart failure, NYHA class 2 ([place])   . Facet joint syndrome    Left L2-3, L3-4, L4-5, L5-S1  . GERD (gastroesophageal reflux disease) 05/14/2011  . Heart murmur   . Hyperlipidemia   . Hypertension   . Left low back pain    without sacroiliac joint componet  . Lumbago   . Lumbosacral spondylosis without myelopathy   . 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 Severe aortic stenosis 12/06/2013  . Shortness of breath dyspnea   . Spinal stenosis, lumbosacral region     Past Surgical History:  Procedure Laterality Date  . CARPAL TUNNEL RELEASE Right 10/31/2015  . CATARACT EXTRACTION Bilateral   . CHOLECYSTECTOMY  2008  . EYE SURGERY    . INTRAOPERATIVE TRANSESOPHAGEAL ECHOCARDIOGRAM N/A 01/19/2014   Procedure: INTRAOPERATIVE TRANSESOPHAGEAL ECHOCARDIOGRAM;  Surgeon: Sherren Mocha, MD;  Location: [facility] OR;  Service: Open Heart Surgery;  Laterality: N/A;  . KNEE ARTHROSCOPY Bilateral   . LEFT AND RIGHT HEART CATHETERIZATION WITH CORONARY ANGIOGRAM N/A 12/08/2013   Procedure: LEFT AND RIGHT HEART CATHETERIZATION WITH CORONARY ANGIOGRAM;  Surgeon: Sanda Klein, MD;  Location: [facility] CATH LAB;  Service: Cardiovascular;  Laterality: N/A;  . REFRACTIVE SURGERY Bilateral   .  REPLACEMENT TOTAL KNEE Right   . TONSILLECTOMY     70 years ago  . TOTAL KNEE ARTHROPLASTY Right 04/28/2014   Procedure: TOTAL KNEE ARTHROPLASTY;  Surgeon: Kathryne Hitch, MD;  Location: [place];  Service: Orthopedics;  Laterality: Right;  . TRANSCATHETER AORTIC VALVE REPLACEMENT, TRANSFEMORAL N/A 01/19/2014   Procedure: TRANSCATHETER AORTIC VALVE REPLACEMENT, TRANSFEMORAL;  Surgeon:  Sherren Mocha, MD;  Location: [place];  Service: Open Heart Surgery;  Laterality: N/A;    Current Medications: Outpatient Medications Prior to Visit  Medication Sig Dispense Refill  . amLODipine (NORVASC) 10 MG tablet Take 1 tablet by mouth once daily 90 tablet 0  . carvedilol (COREG) 12.5 MG tablet Take 1 tablet by mouth twice daily 180 tablet 1  . denosumab (PROLIA) 60 MG/ML SOSY injection Inject 60 mg into the skin every 6 (six) months. 1 mL 0  . esomeprazole (NEXIUM) 20 MG capsule Take 1 capsule (20 mg total) by mouth daily at 12 noon. 90 capsule 1  . ezetimibe (ZETIA) 10 MG tablet Take 1 tablet by mouth once daily 90 tablet 1  . fenofibrate 160 MG tablet Take 1 tablet by mouth once daily 90 tablet 1  . fluticasone (FLONASE) 50 MCG/ACT nasal spray Use one spray in each nostril twice daily as needed for congestion 16 g 3  . mirabegron ER (MYRBETRIQ) 25 MG TB24 tablet Take 1 tablet (25 mg total) by mouth daily. 30 tablet 3  . Multiple Vitamins-Minerals (ICAPS AREDS 2) CAPS Take 2 capsules by mouth daily.    . ondansetron (ZOFRAN) 4 MG tablet Take 1 tablet (4 mg total) by mouth every 8 (eight) hours as needed for nausea. 20 tablet 0  . pantoprazole (PROTONIX) 40 MG tablet Take 1 tablet (40 mg total) by mouth 2 (two) times daily. 30 tablet 3   No facility-administered medications prior to visit.      Allergies:   Statins, Aspirin, Codeine, and Phenytoin   Social History   Socioeconomic History  . Marital status: Widowed    Spouse name: Not on file  . Number of children: 3  . Years of education: Not on file  . Highest education level: Not on file  Occupational History  . Not on file  Social Needs  . Financial resource strain: Not on file  . Food insecurity    Worry: Not on file    Inability: Not on file  . Transportation needs    Medical: Not on file    Non-medical: Not on file  Tobacco Use  . Smoking status: Never Smoker  . Smokeless tobacco: Never Used  Substance and  Sexual Activity  . Alcohol use: No  . Drug use: No  . Sexual activity: Never  Lifestyle  . Physical activity    Days per week: Not on file    Minutes per session: Not on file  . Stress: Not on file  Relationships  . Social Herbalist on phone: Not on file    Gets together: Not on file    Attends religious service: Not on file    Active member of club or organization: Not on file    Attends meetings of clubs or organizations: Not on file    Relationship status: Not on file  Other Topics Concern  . Not on file  Social History Narrative   Diet?       Do you drink/eat things with caffeine? No      Marital status?  widow                           What year were you married?      Do you live in a house, apartment, assisted living, condo, trailer, etc.?  Condo       Is it one or more stories? one      How many persons live in your home? Self      Do you have any pets in your home? (please list) no      Current or past profession: office      Do you exercise?                no                      Type & how often?      Do you have a living will? yes      Do you have a DNR form?  yes                                If not, do you want to discuss one?      Do you have signed POA/HPOA for forms? yes     Family History:  [name] includes Breast cancer in her sister; Heart attack in her sister; Muscular dystrophy in her father; Ovarian cancer in her sister; Stroke in her mother.   ROS:   Please see the history of present illness.    ROS All other systems are reviewed and are negative.   PHYSICAL EXAM:   VS:  BP (!) 159/74   Pulse 74   Ht 5' (1.524 m)   Wt 135 lb (61.2 kg)   SpO2 93%   BMI 26.37 kg/m      General: Alert, oriented x3, no distress Head: no evidence of trauma, PERRL, EOMI, no exophtalmos or lid lag, no myxedema, no xanthelasma; normal ears, nose and oropharynx Neck: normal jugular venous pulsations and no  hepatojugular reflux; brisk carotid pulses without delay and no carotid bruits Chest: clear to auscultation, no signs of consolidation by percussion or palpation, normal fremitus, symmetrical and full respiratory excursions Cardiovascular: normal position and quality of the apical impulse, regular rhythm, normal first and second heart sounds, musical 1/6 systolic ejection murmur in aortic focus, no diastolic murmurs, rubs or gallops Abdomen: no tenderness or distention, no masses by palpation, no abnormal pulsatility or arterial bruits, normal bowel sounds, no hepatosplenomegaly Extremities: no clubbing, cyanosis or edema; 2+ radial, ulnar and brachial pulses bilaterally; 2+ right femoral, posterior tibial and dorsalis pedis pulses; 2+ left femoral, posterior tibial and dorsalis pedis pulses; no subclavian or femoral bruits Neurological: grossly nonfocal Psych: Normal mood and affect   Wt Readings from Last 3 Encounters:  12/11/18 135 lb (61.2 kg)  11/19/18 135 lb 9.6 oz (61.5 kg)  09/24/18 137 lb (62.1 kg)      Studies/Labs Reviewed:   EKG:  EKG is ordered today.  It shows normal sinus rhythm normal tracing, QTC 450 ms   08/26/2017 ECHO: - Left ventricle: The cavity size was normal. There was severe   focal basal and moderate concentric hypertrophy. Systolic   function was normal. The estimated ejection fraction was in the   range of 55% to 60%. Wall motion was normal; there were no   regional wall motion  abnormalities. Features are consistent with   a pseudonormal left ventricular filling pattern, with concomitant   abnormal relaxation and increased filling pressure (grade 2   diastolic dysfunction). Doppler parameters are consistent with   high ventricular filling pressure. - Aortic valve: Mean gradient (S): 14 mm Hg. The Aortic Valve area   (VTI) is 1.1 cm^2 and lower than expected due to small size of   aortic valve prosthesis. - Mitral valve: Severely calcified annulus. Mild  diffuse thickening   and calcification of the anterior leaflet. There was mild   regurgitation. - Left atrium: The atrium was moderately dilated. - Pulmonic valve: There was trivial regurgitation. - Pulmonary arteries: PA peak pressure: 44 mm Hg (S).  ASSESSMENT:    1. Chronic diastolic heart failure ([place])   2. Near syncope   3. S/P TAVR (transcatheter aortic valve replacement)   4. Essential hypertension   5. Atherosclerosis of native coronary artery of native heart without angina pectoris      PLAN:  In order of problems listed above:  1. Chronic diastolic heart failure: Has not been an issue since TAVR.  She had echo evidence of elevated filling pressures, but clinically does not require loop diuretics 2. Near syncope: No recent events.  Holter monitor last year did not show any meaningful arrhythmia.  No indication for pacemaker. 3. AS s/p TAVR: Normal valve function by physical exam and history.  Its time for her to have a follow-up echocardiogram. 4. HTN: Acceptable control. 5. CAD: Minor by previous angiography. Currently asymptomatic. Did not tolerate statin and is on ezetimibe.     Medication Adjustments/Labs and Tests Ordered: Current medicines are reviewed at length with the patient today.  Concerns regarding medicines are outlined above.  Medication changes, Labs and Tests ordered today are listed in the Patient Instructions below. Patient Instructions  Medication Instructions:  No changes *If you need a refill on your cardiac medications before your next appointment, please call your pharmacy*  Lab Work: None ordered If you have labs (blood work) drawn today and your tests are completely normal, you will receive your results only by: Marland Kitchen MyChart Message (if you have MyChart) OR . A paper copy in the mail If you have any lab test that is abnormal or we need to change your treatment, we will call you to review the results.  Testing/Procedures: None ordered   Follow-Up: At [facility], you and your health needs are our priority.  As part of our continuing mission to provide you with exceptional heart care, we have created designated Provider Care Teams.  These Care Teams include your primary Cardiologist (physician) and Advanced Practice Providers (APPs -  Physician Assistants and Nurse Practitioners) who all work together to provide you with the care you need, when you need it.  Your next appointment:   12 months  The format for your next appointment:   Either In Person or Virtual  Provider:   Sanda Klein, MD      Signed, Sanda Klein, MD  12/17/2018 6:51 PM    [address], [place], [place]  [address] Phone: [phone]; Fax: [phone]

## 2018-12-11 NOTE — Patient Instructions (Signed)
Medication Instructions:  No changes *If you need a refill on your cardiac medications before your next appointment, please call your pharmacy*  Lab Work: None ordered If you have labs (blood work) drawn today and your tests are completely normal, you will receive your results only by: Marland Kitchen MyChart Message (if you have MyChart) OR . A paper copy in the mail If you have any lab test that is abnormal or we need to change your treatment, we will call you to review the results.  Testing/Procedures: None ordered  Follow-Up: At [facility], you and your health needs are our priority.  As part of our continuing mission to provide you with exceptional heart care, we have created designated Provider Care Teams.  These Care Teams include your primary Cardiologist (physician) and Advanced Practice Providers (APPs -  Physician Assistants and Nurse Practitioners) who all work together to provide you with the care you need, when you need it.  Your next appointment:   12 months  The format for your next appointment:   Either In Person or Virtual  Provider:   Sanda Klein, MD

## 2018-12-17 ENCOUNTER — Encounter: Payer: Self-pay | Admitting: Cardiovascular Disease

## 2018-12-19 ENCOUNTER — Other Ambulatory Visit: Payer: Self-pay | Admitting: Gastroenterology

## 2018-12-19 DIAGNOSIS — R1311 Dysphagia, oral phase: Secondary | ICD-10-CM

## 2018-12-20 ENCOUNTER — Other Ambulatory Visit: Payer: Self-pay | Admitting: Nurse Practitioner

## 2018-12-29 ENCOUNTER — Other Ambulatory Visit: Payer: Medicare Other

## 2018-12-30 ENCOUNTER — Ambulatory Visit
Admission: RE | Admit: 2018-12-30 | Discharge: 2018-12-30 | Disposition: A | Discharge status: Home / Self Care | Payer: Medicare Other | Source: Ambulatory Visit | Attending: Gastroenterology | Admitting: Gastroenterology

## 2018-12-30 DIAGNOSIS — R1311 Dysphagia, oral phase: Secondary | ICD-10-CM

## 2019-01-05 ENCOUNTER — Other Ambulatory Visit: Payer: Self-pay | Admitting: Nurse Practitioner

## 2019-01-24 ENCOUNTER — Other Ambulatory Visit: Payer: Self-pay | Admitting: Nurse Practitioner

## 2019-01-26 ENCOUNTER — Other Ambulatory Visit: Payer: Self-pay | Admitting: *Deleted

## 2019-01-26 MED ORDER — EZETIMIBE 10 MG PO TABS: 10.0000 mg | ORAL_TABLET | Freq: Every day | ORAL | 1 refills | Status: DC

## 2019-01-26 MED ORDER — FENOFIBRATE 160 MG PO TABS: 160.0000 mg | ORAL_TABLET | Freq: Every day | ORAL | 1 refills | Status: DC

## 2019-01-26 NOTE — Telephone Encounter (Signed)
Patient caregiver requested. Faxed.  Wants refills to go to [facility]

## 2019-01-31 ENCOUNTER — Emergency Department (HOSPITAL_BASED_OUTPATIENT_CLINIC_OR_DEPARTMENT_OTHER): Payer: Medicare Other

## 2019-01-31 ENCOUNTER — Encounter (HOSPITAL_COMMUNITY): Payer: Self-pay | Admitting: *Deleted

## 2019-01-31 ENCOUNTER — Other Ambulatory Visit: Payer: Self-pay

## 2019-01-31 ENCOUNTER — Emergency Department (HOSPITAL_COMMUNITY)
Admission: EM | Admit: 2019-01-31 | Discharge: 2019-01-31 | Disposition: A | Discharge status: Home / Self Care | Payer: Medicare Other | Attending: Emergency Medicine | Admitting: Emergency Medicine

## 2019-01-31 DIAGNOSIS — L03116 Cellulitis of left lower limb: Secondary | ICD-10-CM | POA: Diagnosis not present

## 2019-01-31 DIAGNOSIS — I5032 Chronic diastolic (congestive) heart failure: Secondary | ICD-10-CM | POA: Diagnosis not present

## 2019-01-31 DIAGNOSIS — I110 Hypertensive heart disease with heart failure: Secondary | ICD-10-CM | POA: Insufficient documentation

## 2019-01-31 DIAGNOSIS — L538 Other specified erythematous conditions: Secondary | ICD-10-CM

## 2019-01-31 DIAGNOSIS — R609 Edema, unspecified: Secondary | ICD-10-CM | POA: Diagnosis not present

## 2019-01-31 DIAGNOSIS — M7989 Other specified soft tissue disorders: Secondary | ICD-10-CM

## 2019-01-31 DIAGNOSIS — M79605 Pain in left leg: Secondary | ICD-10-CM | POA: Diagnosis present

## 2019-01-31 DIAGNOSIS — Z96651 Presence of right artificial knee joint: Secondary | ICD-10-CM | POA: Diagnosis not present

## 2019-01-31 DIAGNOSIS — Z79899 Other long term (current) drug therapy: Secondary | ICD-10-CM | POA: Diagnosis not present

## 2019-01-31 LAB — CBC WITH DIFFERENTIAL/PLATELET
Abs Immature Granulocytes: 0.01 10*3/uL (ref 0.00–0.07)
Basophils Absolute: 0 10*3/uL (ref 0.0–0.1)
Basophils Relative: 0 %
Eosinophils Absolute: 0.2 10*3/uL (ref 0.0–0.5)
Eosinophils Relative: 2 %
HCT: 33.2 % — ABNORMAL LOW (ref 36.0–46.0)
Hemoglobin: 10.2 g/dL — ABNORMAL LOW (ref 12.0–15.0)
Immature Granulocytes: 0 %
Lymphocytes Relative: 37 %
Lymphs Abs: 2.6 10*3/uL (ref 0.7–4.0)
MCH: 28.3 pg (ref 26.0–34.0)
MCHC: 30.7 g/dL (ref 30.0–36.0)
MCV: 92 fL (ref 80.0–100.0)
Monocytes Absolute: 0.8 10*3/uL (ref 0.1–1.0)
Monocytes Relative: 12 %
Neutro Abs: 3.4 10*3/uL (ref 1.7–7.7)
Neutrophils Relative %: 49 %
Platelets: 397 10*3/uL (ref 150–400)
RBC: 3.61 MIL/uL — ABNORMAL LOW (ref 3.87–5.11)
RDW: 14.7 % (ref 11.5–15.5)
WBC: 6.9 10*3/uL (ref 4.0–10.5)
nRBC: 0 % (ref 0.0–0.2)

## 2019-01-31 LAB — COMPREHENSIVE METABOLIC PANEL WITH GFR
ALT: 25 U/L (ref 0–44)
AST: 27 U/L (ref 15–41)
Albumin: 3.7 g/dL (ref 3.5–5.0)
Alkaline Phosphatase: 32 U/L — ABNORMAL LOW (ref 38–126)
Anion gap: 12 (ref 5–15)
BUN: 20 mg/dL (ref 8–23)
CO2: 24 mmol/L (ref 22–32)
Calcium: 8.9 mg/dL (ref 8.9–10.3)
Chloride: 103 mmol/L (ref 98–111)
Creatinine, Ser: 1.66 mg/dL — ABNORMAL HIGH (ref 0.44–1.00)
GFR calc Af Amer: 31 mL/min — ABNORMAL LOW
GFR calc non Af Amer: 27 mL/min — ABNORMAL LOW
Glucose, Bld: 101 mg/dL — ABNORMAL HIGH (ref 70–99)
Potassium: 3.7 mmol/L (ref 3.5–5.1)
Sodium: 139 mmol/L (ref 135–145)
Total Bilirubin: 0.6 mg/dL (ref 0.3–1.2)
Total Protein: 7.4 g/dL (ref 6.5–8.1)

## 2019-01-31 LAB — PROTIME-INR
INR: 0.9 (ref 0.8–1.2)
Prothrombin Time: 12.4 seconds (ref 11.4–15.2)

## 2019-01-31 MED ORDER — CEPHALEXIN 500 MG PO CAPS: ORAL_CAPSULE | ORAL | 0 refills | Status: DC

## 2019-01-31 NOTE — ED Provider Notes (Signed)
[facility] DEPT Provider Note   CSN: [id number] Arrival date & time: 01/31/19  1154     History Chief Complaint  Patient presents with  . Leg Pain    [name] is a 85 y.o. female who presents emergency department chief complaint of left leg pain and swelling.  Patient states that 3 days ago she began having swelling in her leg.  She states that it feels tight but is not giving her any problems walking.  She states that it is aching and she can "feel some fever" in the lower leg.  She has noticed it has become more red.  She never had anything like this before.  She denies a history of DVT or PE. She is not on anticoagulation.  HPI     Past Medical History:  Diagnosis Date  . Anxiety   . Arthritis    Both knees R>L  . Central stenosis of spinal canal    moderate L4/5 with L4/5 and L5/S1 disc bulges  . Chronic diastolic congestive heart failure, NYHA class 2 ([place])   . Facet joint syndrome    Left L2-3, L3-4, L4-5, L5-S1  . GERD (gastroesophageal reflux disease) 05/14/2011  . Heart murmur   . Hyperlipidemia   . Hypertension   . Left low back pain    without sacroiliac joint componet  . Lumbago   . Lumbosacral spondylosis without myelopathy   . 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 Severe aortic stenosis 12/06/2013  . Shortness of breath dyspnea   . Spinal stenosis, lumbosacral region     Patient Active Problem List   Diagnosis Date Noted  . Osteoporosis 02/18/2018  . DDD (degenerative disc disease), lumbar 08/14/2016  . Lumbar facet joint syndrome 08/14/2016  . Lumbosacral spondylosis without myelopathy 08/14/2016  . Lumbosacral stenosis 08/14/2016  . Estrogen deficiency 12/06/2015  . Coronary atherosclerosis 08/30/2015  . Back pain 08/30/2015  . DJD (degenerative joint disease) of knee 04/28/2014  . S/P TAVR  (transcatheter aortic valve replacement) 01/19/2014  . Severe aortic valve stenosis s/p TAVR 01/19/2014  . Aortic stenosis 12/14/2013  . Arthritis   . Chronic diastolic congestive heart failure, NYHA class 2 ([place])   . Severe aortic stenosis 12/06/2013  . Essential hypertension 11/18/2013  . Hyperlipidemia 11/18/2013  . Aortic heart murmur 11/18/2013  . GERD (gastroesophageal reflux disease) 05/14/2011  . Dysphagia 05/14/2011    Past Surgical History:  Procedure Laterality Date  . CARPAL TUNNEL RELEASE Right 10/31/2015  . CATARACT EXTRACTION Bilateral   . CHOLECYSTECTOMY  2008  . EYE SURGERY    . INTRAOPERATIVE TRANSESOPHAGEAL ECHOCARDIOGRAM N/A 01/19/2014   Procedure: INTRAOPERATIVE TRANSESOPHAGEAL ECHOCARDIOGRAM;  Surgeon: Sherren Mocha, MD;  Location: [facility] OR;  Service: Open Heart Surgery;  Laterality: N/A;  . KNEE ARTHROSCOPY Bilateral   . LEFT AND RIGHT HEART CATHETERIZATION WITH CORONARY ANGIOGRAM N/A 12/08/2013   Procedure: LEFT AND RIGHT HEART CATHETERIZATION WITH CORONARY ANGIOGRAM;  Surgeon: Sanda Klein, MD;  Location: [facility] CATH LAB;  Service: Cardiovascular;  Laterality: N/A;  . REFRACTIVE SURGERY Bilateral   . REPLACEMENT TOTAL KNEE Right   . TONSILLECTOMY     70 years ago  . TOTAL KNEE ARTHROPLASTY Right 04/28/2014   Procedure: TOTAL KNEE ARTHROPLASTY;  Surgeon: Kathryne Hitch, MD;  Location: [facility];  Service: Orthopedics;  Laterality: Right;  . TRANSCATHETER AORTIC VALVE REPLACEMENT, TRANSFEMORAL N/A 01/19/2014  Procedure: TRANSCATHETER AORTIC VALVE REPLACEMENT, TRANSFEMORAL;  Surgeon: Sherren Mocha, MD;  Location: [facility];  Service: Open Heart Surgery;  Laterality: N/A;     OB History   No obstetric history on file.     Family History  Problem Relation Age of Onset  . Muscular dystrophy Father        Died at 45  . Stroke Mother   . Ovarian cancer Sister   . Breast cancer Sister   . Heart attack Sister        Died at 66  . Colon cancer Neg Hx   . Hypertension  Neg Hx     Social History   Tobacco Use  . Smoking status: Never Smoker  . Smokeless tobacco: Never Used  Substance Use Topics  . Alcohol use: No  . Drug use: No    Home Medications Prior to Admission medications   Medication Sig Start Date End Date Taking? Authorizing Provider  amLODipine (NORVASC) 10 MG tablet Take 1 tablet by mouth once daily 12/22/18   Lauree Chandler, NP  carvedilol (COREG) 12.5 MG tablet Take 1 tablet by mouth twice daily 01/05/19   Lauree Chandler, NP  denosumab (PROLIA) 60 MG/ML SOSY injection Inject 60 mg into the skin every 6 (six) months. 09/18/18   Lauree Chandler, NP  esomeprazole (NEXIUM) 20 MG capsule Take 1 capsule (20 mg total) by mouth daily at 12 noon. 09/24/18   Lauree Chandler, NP  ezetimibe (ZETIA) 10 MG tablet Take 1 tablet (10 mg total) by mouth daily. 01/26/19   Lauree Chandler, NP  fenofibrate 160 MG tablet Take 1 tablet (160 mg total) by mouth daily. 01/26/19   Lauree Chandler, NP  fluticasone (FLONASE) 50 MCG/ACT nasal spray Use one spray in each nostril twice daily as needed for congestion 06/24/18   Lauree Chandler, NP  mirabegron ER (MYRBETRIQ) 25 MG TB24 tablet Take 1 tablet (25 mg total) by mouth daily. 09/17/16   Lauree Chandler, NP  Multiple Vitamins-Minerals (ICAPS AREDS 2) CAPS Take 2 capsules by mouth daily.    [provider]  ondansetron (ZOFRAN) 4 MG tablet Take 1 tablet (4 mg total) by mouth every 8 (eight) hours as needed for nausea. 11/19/18   Ngetich, Dinah C, NP  pantoprazole (PROTONIX) 40 MG tablet Take 1 tablet (40 mg total) by mouth 2 (two) times daily. 11/19/18   Ngetich, Dinah C, NP    Allergies    Statins, Aspirin, Codeine, and Phenytoin  Review of Systems   Review of Systems Ten systems reviewed and are negative for acute change, except as noted in the HPI.   Physical Exam Updated Vital Signs BP (!) 165/71 (BP Location: Right Arm)   Pulse 79   Temp 98.2 F (36.8 C) (Oral)   Resp 16    Ht 5' (1.524 m)   Wt 61.2 kg   SpO2 95%   BMI 26.37 kg/m   Physical Exam Vitals and nursing note reviewed.  Constitutional:      General: She is not in acute distress.    Appearance: She is well-developed. She is not diaphoretic.  HENT:     Head: Normocephalic and atraumatic.  Eyes:     General: No scleral icterus.    Conjunctiva/sclera: Conjunctivae normal.  Cardiovascular:     Rate and Rhythm: Normal rate and regular rhythm.     Heart sounds: Normal heart sounds. No murmur. No friction rub. No gallop.   Pulmonary:  Effort: Pulmonary effort is normal. No respiratory distress.     Breath sounds: Normal breath sounds.  Abdominal:     General: Bowel sounds are normal. There is no distension.     Palpations: Abdomen is soft. There is no mass.     Tenderness: There is no abdominal tenderness. There is no guarding.  Musculoskeletal:        General: Normal range of motion.     Cervical back: Normal range of motion.     Left lower leg: Edema present.     Comments: Left lower extremity with erythema, pitting edema  No tenderness to palpation  Skin:    General: Skin is warm and dry.  Neurological:     Mental Status: She is alert and oriented to person, place, and time.  Psychiatric:        Behavior: Behavior normal.     ED Results / Procedures / Treatments   Labs (all labs ordered are listed, but only abnormal results are displayed) Labs Reviewed  COMPREHENSIVE METABOLIC PANEL  CBC WITH DIFFERENTIAL/PLATELET  PROTIME-INR    EKG None  Radiology No results found.  Procedures Procedures (including critical care time)  Medications Ordered in ED Medications - No data to display  ED Course  I have reviewed the triage vital signs and the nursing notes.  Pertinent labs & imaging results that were available during my care of the patient were reviewed by me and considered in my medical decision making (see chart for details).    MDM Rules/Calculators/A&P      CHA2DS2/VAS Stroke Risk Points      N/A >= 2 Points: High Risk  1 - 1.99 Points: Medium Risk  0 Points: Low Risk    A final score could not be computed because of missing components.: Last  Change: N/A     This score determines the patient's risk of having a stroke if the  patient has atrial fibrillation.      This score is not applicable to this patient. Components are not  calculated.                   This is a 85 year old female here with left lower extremity swelling. . Differential includes cellulitis, DVT, anasarca, venous insufficiency.  Patient has no elevated white blood cell count.  She has some chronic more normocytic anemia, chronic renal insufficiency at baseline.  Normal PT/INR.  I personally reviewed the patient's vascular ultrasound study which shows no evidence of DVT.  Patient seen and shared visit with Dr. Lita Mains.  Patient appears to have a cellulitis which treated with Keflex.  Discussed return precautions.  She is to follow-up with her primary care physician in the next 2 to 3 days or return to the emergency department for any new or worsening symptoms including fevers, streaking spreading up the leg. Final Clinical Impression(s) / ED Diagnoses Final diagnoses:  None    Rx / DC Orders ED Discharge Orders    None       Margarita Mail, PA-C 01/31/19 2204    Julianne Rice, MD 02/01/19 [phone]

## 2019-01-31 NOTE — Discharge Instructions (Addendum)
Get help right away if: Your symptoms get worse. You feel very sleepy. You develop vomiting or diarrhea that persists. You notice red streaks coming from the infected area. Your red area gets larger or turns dark in color. 

## 2019-01-31 NOTE — Progress Notes (Signed)
Left lower extremity venous duplex complete.  Preliminary results given to PA @ 15:55. Please see CV Proc tab for preliminary results. [place], RVT 4:21 PM  01/31/2019

## 2019-01-31 NOTE — ED Triage Notes (Signed)
Pt states for about 3 days, left leg swelling and pain that moves around. (-) Homan's sign

## 2019-01-31 NOTE — ED Notes (Signed)
Pt ambulated to restroom with steady gait and with stand by assist

## 2019-02-03 ENCOUNTER — Encounter: Payer: Self-pay | Admitting: Family

## 2019-02-03 ENCOUNTER — Ambulatory Visit (INDEPENDENT_AMBULATORY_CARE_PROVIDER_SITE_OTHER): Payer: Medicare Other | Admitting: Family

## 2019-02-03 ENCOUNTER — Other Ambulatory Visit: Payer: Self-pay

## 2019-02-03 VITALS — BP 150/78 | HR 67 | Temp 97.5°F | Ht 60.0 in | Wt 132.0 lb

## 2019-02-03 DIAGNOSIS — I10 Essential (primary) hypertension: Secondary | ICD-10-CM

## 2019-02-03 DIAGNOSIS — L03116 Cellulitis of left lower limb: Secondary | ICD-10-CM

## 2019-02-03 NOTE — Progress Notes (Signed)
Provider: Francoise Chojnowski FNP-C  Lauree Chandler, NP  Patient Care Team: Lauree Chandler, NP as PCP - General (Geriatric Medicine) Milus Banister, MD as Attending Physician (Gastroenterology) Alphonsa Overall, MD as Consulting Physician (General Surgery) Sherren Mocha, MD as Consulting Physician (Cardiology) Suella Broad, MD as Consulting Physician (Physical Medicine and Rehabilitation) Dalton-Bethea, Fabio Asa, MD as Consulting Physician (Physical Medicine and Rehabilitation) Thalia Bloodgood, [place] as Referring Physician [facility])  Extended Emergency Contact Information Primary Emergency Contact: Johnson,[name] Address: [address], [place] [address] Johnnette Litter of [place] Phone: [phone] Relation: Daughter Secondary Emergency Contact: Hartley Barefoot" Address: [address]          [place], [place] [address] Johnnette Litter of [place] Phone: [phone] Mobile Phone: [phone] Relation: Son  Code Status:  Goals of care: Advanced Directive information Advanced Directives 01/31/2019  Does Patient Have a Medical Advance Directive? Yes  Type of Advance Directive -  Does patient want to make changes to medical advance directive? -  Copy of [place] in Chart? -     Chief Complaint  Patient presents with  . Follow-up    Follow up on ED visit for left leg cellulitis     HPI:  Pt is a 85 y.o. female seen today for an acute visit for follow up ED visit 01/31/2019 for left leg cellulitis.she presented to ED with left leg swelling and redness.Vascular ultrasound study showed no evidence of DVT.she was discharged with 7 days course of keflex.she states leg feels much better redness has resolved but still has some swelling.she states leg does not hurt but tender when mashed. She denies nay fever,chills,numbness or tingling or weakness on the leg. Her blood pressure slightly elevated this visit though states did not take her  blood pressure medication prior to this visit but will take it when she retuns home.    Past Medical History:  Diagnosis Date  . Anxiety   . Arthritis    Both knees R>L  . Central stenosis of spinal canal    moderate L4/5 with L4/5 and L5/S1 disc bulges  . Chronic diastolic congestive heart failure, NYHA class 2 ([place])   . Facet joint syndrome    Left L2-3, L3-4, L4-5, L5-S1  . GERD (gastroesophageal reflux disease) 05/14/2011  . Heart murmur   . Hyperlipidemia   . Hypertension   . Left low back pain    without sacroiliac joint componet  . Lumbago   . Lumbosacral spondylosis without myelopathy   . 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 Severe aortic stenosis 12/06/2013  . Shortness of breath dyspnea   . Spinal stenosis, lumbosacral region    Past Surgical History:  Procedure Laterality Date  . CARPAL TUNNEL RELEASE Right 10/31/2015  . CATARACT EXTRACTION Bilateral   . CHOLECYSTECTOMY  2008  . EYE SURGERY    . INTRAOPERATIVE TRANSESOPHAGEAL ECHOCARDIOGRAM N/A 01/19/2014   Procedure: INTRAOPERATIVE TRANSESOPHAGEAL ECHOCARDIOGRAM;  Surgeon: Sherren Mocha, MD;  Location: [facility] OR;  Service: Open Heart Surgery;  Laterality: N/A;  . KNEE ARTHROSCOPY Bilateral   . LEFT AND RIGHT HEART CATHETERIZATION WITH CORONARY ANGIOGRAM N/A 12/08/2013   Procedure: LEFT AND RIGHT HEART CATHETERIZATION WITH CORONARY ANGIOGRAM;  Surgeon: Sanda Klein, MD;  Location: [facility] CATH LAB;  Service: Cardiovascular;  Laterality: N/A;  . REFRACTIVE SURGERY Bilateral   .  REPLACEMENT TOTAL KNEE Right   . TONSILLECTOMY     70 years ago  . TOTAL KNEE ARTHROPLASTY Right 04/28/2014   Procedure: TOTAL KNEE ARTHROPLASTY;  Surgeon: Kathryne Hitch, MD;  Location: [facility];  Service: Orthopedics;  Laterality: Right;  . TRANSCATHETER AORTIC VALVE REPLACEMENT, TRANSFEMORAL N/A 01/19/2014   Procedure:  TRANSCATHETER AORTIC VALVE REPLACEMENT, TRANSFEMORAL;  Surgeon: Sherren Mocha, MD;  Location: [facility];  Service: Open Heart Surgery;  Laterality: N/A;    Allergies  Allergen Reactions  . Statins     pain  . Aspirin Nausea And Vomiting  . Codeine Nausea And Vomiting  . Phenytoin Nausea And Vomiting    Outpatient Encounter Medications as of 02/03/2019  Medication Sig  . amLODipine (NORVASC) 10 MG tablet Take 1 tablet by mouth once daily  . carvedilol (COREG) 12.5 MG tablet Take 1 tablet by mouth twice daily  . cephALEXin (KEFLEX) 500 MG capsule 2 caps po bid x 7 days  . denosumab (PROLIA) 60 MG/ML SOSY injection Inject 60 mg into the skin every 6 (six) months.  . ezetimibe (ZETIA) 10 MG tablet Take 1 tablet (10 mg total) by mouth daily.  . fenofibrate 160 MG tablet Take 1 tablet (160 mg total) by mouth daily.  . fluticasone (FLONASE) 50 MCG/ACT nasal spray Use one spray in each nostril twice daily as needed for congestion  . ibuprofen (ADVIL) 200 MG tablet Take 200 mg by mouth every 6 (six) hours as needed for mild pain or moderate pain.  Marland Kitchen metoCLOPramide (REGLAN) 5 MG tablet Take 5 mg by mouth 2 (two) times daily.  . mirabegron ER (MYRBETRIQ) 25 MG TB24 tablet Take 1 tablet (25 mg total) by mouth daily.  . Multiple Vitamins-Minerals (ICAPS AREDS 2) CAPS Take 2 capsules by mouth daily.  . ondansetron (ZOFRAN) 4 MG tablet Take 1 tablet (4 mg total) by mouth every 8 (eight) hours as needed for nausea.  . pantoprazole (PROTONIX) 40 MG tablet Take 1 tablet (40 mg total) by mouth 2 (two) times daily.  . [DISCONTINUED] esomeprazole (NEXIUM) 20 MG capsule Take 1 capsule (20 mg total) by mouth daily at 12 noon. (Patient not taking: Reported on 01/31/2019)   No facility-administered encounter medications on file as of 02/03/2019.    Review of Systems  Constitutional: Negative for appetite change, chills, fatigue and fever.  Respiratory: Negative for cough, chest tightness, shortness of breath  and wheezing.   Cardiovascular: Positive for leg swelling. Negative for chest pain and palpitations.  Gastrointestinal: Negative for abdominal distention, abdominal pain, diarrhea, nausea and vomiting.  Musculoskeletal: Positive for gait problem.  Skin: Negative for color change, pallor and rash.       Left leg cellulitis   Neurological: Negative for dizziness, weakness, light-headedness, numbness and headaches.  Psychiatric/Behavioral: Negative for agitation, confusion and sleep disturbance. The patient is not nervous/anxious.     Immunization History  Administered Date(s) Administered  . Influenza, High Dose Seasonal PF 11/26/2016, 11/07/2017, 11/03/2018  . Influenza-Unspecified 11/05/2013, 10/21/2014, 10/27/2015  . Pneumococcal Conjugate-13 12/18/2016  . Pneumococcal Polysaccharide-23 11/01/2012, 12/06/2015   Pertinent  Health Maintenance Due  Topic Date Due  . MAMMOGRAM  06/19/2023 (Originally 01/15/2017)  . INFLUENZA VACCINE  Completed  . DEXA SCAN  Completed  . PNA vac Low Risk Adult  Completed   Fall Risk  02/03/2019 11/19/2018 09/24/2018 03/19/2018 02/14/2018  Falls in the past year? 0 0 0 0 0  Number falls in past yr: 0 0 0 0 0  Injury with Fall?  0 - 0 0 0    Vitals:   02/03/19 0920  BP: (!) 150/78  Pulse: 67  Temp: (!) 97.5 F (36.4 C)  TempSrc: Temporal  SpO2: 97%  Weight: 132 lb (59.9 kg)  Height: 5' (1.524 m)   Body mass index is 25.78 kg/m. Physical Exam Vitals reviewed.  HENT:     Head: Normocephalic.  Eyes:     General: No scleral icterus.       Right eye: No discharge.        Left eye: No discharge.     Conjunctiva/sclera: Conjunctivae normal.     Pupils: Pupils are equal, round, and reactive to light.  Neck:     Vascular: No carotid bruit.  Cardiovascular:     Rate and Rhythm: Normal rate and regular rhythm.     Pulses: Normal pulses.     Heart sounds: Normal heart sounds. No murmur. No friction rub. No gallop.   Pulmonary:     Effort:  Pulmonary effort is normal. No respiratory distress.     Breath sounds: Normal breath sounds. No wheezing, rhonchi or rales.  Chest:     Chest wall: No tenderness.  Abdominal:     General: Bowel sounds are normal. There is no distension.     Palpations: Abdomen is soft. There is no mass.     Tenderness: There is no abdominal tenderness. There is no right CVA tenderness, left CVA tenderness, guarding or rebound.  Musculoskeletal:     Cervical back: Normal range of motion. No rigidity or tenderness.     Comments: Left leg trace edema.tender to palpate over shin area. Unsteady gait ambulates with a cane.  Lymphadenopathy:     Cervical: No cervical adenopathy.  Skin:    General: Skin is warm and dry.     Coloration: Skin is not pale.     Findings: No bruising, erythema or rash.     Comments: Left leg small scab on shin area.bilateral shin area brown skin discoloration.slight tender to palpation.Thin skin.   Neurological:     Mental Status: She is alert. Mental status is at baseline.     Cranial Nerves: No cranial nerve deficit.     Sensory: No sensory deficit.     Motor: No weakness.  Psychiatric:        Mood and Affect: Mood normal.        Behavior: Behavior normal.        Thought Content: Thought content normal.        Judgment: Judgment normal.    Labs reviewed: Recent Labs    02/14/18 0916 09/24/18 0954 01/31/19 1430  NA 140 141 139  K 4.6 4.8 3.7  CL 105 107 103  CO2 '28 25 24  ' GLUCOSE 89 93 101*  BUN 25 26* 20  CREATININE 1.55* 1.52* 1.66*  CALCIUM 10.3 9.8 8.9   Recent Labs    02/14/18 0916 09/24/18 0954 01/31/19 1430  AST '21 17 27  ' ALT '12 15 25  ' ALKPHOS  --   --  32*  BILITOT 0.4 0.4 0.6  PROT 7.5 7.4 7.4  ALBUMIN  --   --  3.7   Recent Labs    02/14/18 0916 09/24/18 0954 01/31/19 1430  WBC 5.6 6.2 6.9  NEUTROABS 3,237 3,410 3.4  HGB 10.7* 11.0* 10.2*  HCT 33.0* 34.7* 33.2*  MCV 87.1 89.2 92.0  PLT 349 334 397   Lab Results  Component Value  Date   TSH 2.31 02/14/2018  Lab Results  Component Value Date   HGBA1C 5.4 01/15/2014   Lab Results  Component Value Date   CHOL 192 09/24/2018   HDL 54 09/24/2018   LDLCALC 116 (H) 09/24/2018   TRIG 111 09/24/2018   CHOLHDL 3.6 09/24/2018    Significant Diagnostic Results in last 30 days:  VAS [place] LOWER EXTREMITY VENOUS (DVT) ([facility] and [facility] 7a-7p)  Result Date: 02/01/2019  Lower Venous Study Indications: Swelling, Edema, and Erythema.  Performing Technologist: Antonieta Pert RDMS, RVT  Examination Guidelines: A complete evaluation includes B-mode imaging, spectral Doppler, color Doppler, and power Doppler as needed of all accessible portions of each vessel. Bilateral testing is considered an integral part of a complete examination. Limited examinations for reoccurring indications may be performed as noted.  +-----+---------------+---------+-----------+----------+--------------+ RIGHTCompressibilityPhasicitySpontaneityPropertiesThrombus Aging +-----+---------------+---------+-----------+----------+--------------+ CFV  Full           Yes      Yes                                 +-----+---------------+---------+-----------+----------+--------------+   +---------+---------------+---------+-----------+----------+--------------+ LEFT     CompressibilityPhasicitySpontaneityPropertiesThrombus Aging +---------+---------------+---------+-----------+----------+--------------+ CFV      Full           Yes      Yes                                 +---------+---------------+---------+-----------+----------+--------------+ SFJ      Full                                                        +---------+---------------+---------+-----------+----------+--------------+ FV Prox  Full                                                        +---------+---------------+---------+-----------+----------+--------------+ FV Mid   Full                                                         +---------+---------------+---------+-----------+----------+--------------+ FV DistalFull                                                        +---------+---------------+---------+-----------+----------+--------------+ PFV      Full                                                        +---------+---------------+---------+-----------+----------+--------------+ POP      Full           Yes      Yes                                 +---------+---------------+---------+-----------+----------+--------------+  PTV      Full                                                        +---------+---------------+---------+-----------+----------+--------------+ PERO     Full                                                        +---------+---------------+---------+-----------+----------+--------------+ GSV      Full                                                        +---------+---------------+---------+-----------+----------+--------------+ Pitting edema noted left lower extremity.    Summary: Right: No evidence of common femoral vein obstruction. Left: There is no evidence of deep vein thrombosis in the lower extremity. No cystic structure found in the popliteal fossa.  *See table(s) above for measurements and observations. Electronically signed by Servando Snare MD on 02/01/2019 at 10:55:24 AM.    Final     Assessment/Plan 1. Left leg cellulitis Afebrile.Healed.small scab on shin area with trace edema.slight tender to palpation.No redness noted.slight tenderness to palpation to latera;l shin area. brown skin discoloration.will obtain follow up labs. - BMP with eGFR([facility]) - CBC with Differential/Platelet  2. Essential hypertension B/p not at goal this visit though did not take her blood pressure medication.Patient states will take medication as directed once she arrives home. - BMP with eGFR([facility])  Family/ staff Communication: Reviewed plan of care with patient  and daughter.   Labs/tests ordered:   - BMP with eGFR([facility]) - CBC with Differential/Platelet Next appointment: 03/30/2018 for 6 months follow up with Sherrie Mustache NP   Sandrea Hughs, NP

## 2019-02-03 NOTE — Patient Instructions (Signed)
Complete Keflex as directed in the ED.Notify provider if running any fever or symptoms does not improve after finishing Keflex.

## 2019-02-04 LAB — CBC WITH DIFFERENTIAL/PLATELET
Absolute Monocytes: 644 cells/uL (ref 200–950)
Basophils Absolute: 28 cells/uL (ref 0–200)
Basophils Relative: 0.5 %
Eosinophils Absolute: 230 cells/uL (ref 15–500)
Eosinophils Relative: 4.1 %
HCT: 34.1 % — ABNORMAL LOW (ref 35.0–45.0)
Hemoglobin: 10.9 g/dL — ABNORMAL LOW (ref 11.7–15.5)
Lymphs Abs: 1826 cells/uL (ref 850–3900)
MCH: 28.4 pg (ref 27.0–33.0)
MCHC: 32 g/dL (ref 32.0–36.0)
MCV: 88.8 fL (ref 80.0–100.0)
MPV: 11.8 fL (ref 7.5–12.5)
Monocytes Relative: 11.5 %
Neutro Abs: 2873 cells/uL (ref 1500–7800)
Neutrophils Relative %: 51.3 %
Platelets: 401 10*3/uL — ABNORMAL HIGH (ref 140–400)
RBC: 3.84 10*6/uL (ref 3.80–5.10)
RDW: 13 % (ref 11.0–15.0)
Total Lymphocyte: 32.6 %
WBC: 5.6 10*3/uL (ref 3.8–10.8)

## 2019-02-04 LAB — BASIC METABOLIC PANEL WITH GFR
BUN/Creatinine Ratio: 17 (calc) (ref 6–22)
BUN: 25 mg/dL (ref 7–25)
CO2: 24 mmol/L (ref 20–32)
Calcium: 9.6 mg/dL (ref 8.6–10.4)
Chloride: 106 mmol/L (ref 98–110)
Creat: 1.49 mg/dL — ABNORMAL HIGH (ref 0.60–0.88)
GFR, Est African American: 35 mL/min/{1.73_m2} — ABNORMAL LOW (ref >=60)
GFR, Est Non African American: 30 mL/min/{1.73_m2} — ABNORMAL LOW (ref >=60)
Glucose, Bld: 98 mg/dL (ref 65–99)
Potassium: 4.7 mmol/L (ref 3.5–5.3)
Sodium: 140 mmol/L (ref 135–146)

## 2019-03-14 ENCOUNTER — Ambulatory Visit: Payer: Medicare Other | Attending: Nurse Practitioner

## 2019-03-14 DIAGNOSIS — Z23 Encounter for immunization: Secondary | ICD-10-CM

## 2019-03-14 NOTE — Progress Notes (Signed)
   Covid-19 Vaccination Clinic  Name:  [name]    MRN: [id number] DOB: 04-24-1925  03/14/2019  [name] was observed post Covid-19 immunization for 15 minutes without incidence. She was provided with Vaccine Information Sheet and instruction to access the V-Safe system.   [name] was instructed to call 911 with any severe reactions post vaccine: Marland Kitchen Difficulty breathing  . Swelling of your face and throat  . A fast heartbeat  . A bad rash all over your body  . Dizziness and weakness    Immunizations Administered    Name Date Dose VIS Date Route   Pfizer COVID-19 Vaccine 03/14/2019 11:18 AM 0.3 mL 01/30/2019 Intramuscular   Manufacturer: [place]   Lot: [id number]   [place]: [address]-9

## 2019-03-23 ENCOUNTER — Other Ambulatory Visit: Payer: Self-pay | Admitting: *Deleted

## 2019-03-23 MED ORDER — DENOSUMAB 60 MG/ML ~~LOC~~ SOSY: 60.0000 mg | PREFILLED_SYRINGE | SUBCUTANEOUS | 0 refills | Status: DC

## 2019-03-23 MED ORDER — AMLODIPINE BESYLATE 10 MG PO TABS: 10.0000 mg | ORAL_TABLET | Freq: Every day | ORAL | 1 refills | Status: DC

## 2019-03-23 NOTE — Telephone Encounter (Signed)
Patient requested refills.

## 2019-03-31 ENCOUNTER — Ambulatory Visit: Payer: Medicare Other | Admitting: Nurse Practitioner

## 2019-04-03 ENCOUNTER — Other Ambulatory Visit: Payer: Self-pay

## 2019-04-03 ENCOUNTER — Ambulatory Visit (INDEPENDENT_AMBULATORY_CARE_PROVIDER_SITE_OTHER): Payer: Medicare Other | Admitting: Nurse Practitioner

## 2019-04-03 ENCOUNTER — Encounter: Payer: Self-pay | Admitting: Nurse Practitioner

## 2019-04-03 VITALS — BP 150/80 | HR 71 | Temp 97.8°F | Ht 60.0 in | Wt 129.8 lb

## 2019-04-03 DIAGNOSIS — E2839 Other primary ovarian failure: Secondary | ICD-10-CM

## 2019-04-03 DIAGNOSIS — M51369 Other intervertebral disc degeneration, lumbar region without mention of lumbar back pain or lower extremity pain: Secondary | ICD-10-CM

## 2019-04-03 DIAGNOSIS — I5032 Chronic diastolic (congestive) heart failure: Secondary | ICD-10-CM | POA: Diagnosis not present

## 2019-04-03 DIAGNOSIS — N184 Chronic kidney disease, stage 4 (severe): Secondary | ICD-10-CM | POA: Diagnosis not present

## 2019-04-03 DIAGNOSIS — M5136 Other intervertebral disc degeneration, lumbar region: Secondary | ICD-10-CM

## 2019-04-03 DIAGNOSIS — I10 Essential (primary) hypertension: Secondary | ICD-10-CM

## 2019-04-03 DIAGNOSIS — E785 Hyperlipidemia, unspecified: Secondary | ICD-10-CM

## 2019-04-03 DIAGNOSIS — Z7189 Other specified counseling: Secondary | ICD-10-CM

## 2019-04-03 DIAGNOSIS — M810 Age-related osteoporosis without current pathological fracture: Secondary | ICD-10-CM | POA: Diagnosis not present

## 2019-04-03 DIAGNOSIS — K219 Gastro-esophageal reflux disease without esophagitis: Secondary | ICD-10-CM

## 2019-04-03 MED ORDER — DENOSUMAB 60 MG/ML ~~LOC~~ SOSY
60.0000 mg | PREFILLED_SYRINGE | Freq: Once | SUBCUTANEOUS | Status: AC
  Administered 2019-04-03: 60 mg via SUBCUTANEOUS

## 2019-04-03 NOTE — Progress Notes (Signed)
Careteam: Patient Care Team: Lauree Chandler, NP as PCP - General (Geriatric Medicine) Milus Banister, MD as Attending Physician (Gastroenterology) Alphonsa Overall, MD as Consulting Physician (General Surgery) Sherren Mocha, MD as Consulting Physician (Cardiology) Suella Broad, MD as Consulting Physician (Physical Medicine and Rehabilitation) Dalton-Bethea, Fabio Asa, MD as Consulting Physician (Physical Medicine and Rehabilitation) Thalia Bloodgood, [place] as Referring Physician (Optometry)  Advanced Directive information Does Patient Have a Medical Advance Directive?: Yes, Type of Advance Directive: [place];Living will, Does patient want to make changes to medical advance directive?: No - Patient declined  Allergies  Allergen Reactions  . Statins     pain  . Aspirin Nausea And Vomiting  . Codeine Nausea And Vomiting  . Phenytoin Nausea And Vomiting    Chief Complaint  Patient presents with  . Medical Management of Chronic Issues    6 month follow-up.   . Injections    Prolia Injection      HPI: Patient is a 86 y.o. female for routine follow up  htn- did not take blood pressure medication  GERD- went to GI due to worsening symptoms with nausea and vomiting. Added reglan with protonix which has significantly helped.   Anxiety/depression- stopped remeron and then was not able to sleep and very agitated. Now able to sleep.   Hyperlipidemia- question if she should stop medication. Having issues with pill burden. Gets sick on her stomach a lot with medication.   Chronic back pain- ongoing, has maxed out referrals with neurology, orthopedic, PT without benefit. Sits most the time due to the pain. Unable to take pain medication due to being sick on her stomach.   Osteoporosis- getting prolia today.   Review of Systems:  Review of Systems  Constitutional: Positive for weight loss (eating better now). Negative for chills and fever.  HENT: Positive for  hearing loss (severe, hearing aides not in today).   Respiratory: Negative for cough, sputum production and shortness of breath.   Cardiovascular: Negative for chest pain, palpitations and leg swelling.  Gastrointestinal: Negative for abdominal pain, constipation, diarrhea and heartburn.  Genitourinary: Negative for dysuria, frequency and urgency.  Musculoskeletal: Positive for back pain. Negative for falls, joint pain and myalgias.  Skin: Negative.   Neurological: Negative for dizziness and headaches.  Psychiatric/Behavioral: Positive for memory loss (progressively worsening per daughter). Negative for depression. The patient does not have insomnia.     Past Medical History:  Diagnosis Date  . Anxiety   . Arthritis    Both knees R>L  . Central stenosis of spinal canal    moderate L4/5 with L4/5 and L5/S1 disc bulges  . Chronic diastolic congestive heart failure, NYHA class 2 ([place])   . Facet joint syndrome    Left L2-3, L3-4, L4-5, L5-S1  . GERD (gastroesophageal reflux disease) 05/14/2011  . Heart murmur   . Hyperlipidemia   . Hypertension   . Left low back pain    without sacroiliac joint componet  . Lumbago   . Lumbosacral spondylosis without myelopathy   . 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 Severe aortic stenosis 12/06/2013  . Shortness of breath dyspnea   . Spinal stenosis, lumbosacral region    Past Surgical History:  Procedure Laterality Date  . CARPAL TUNNEL RELEASE Right 10/31/2015  . CATARACT EXTRACTION Bilateral   . CHOLECYSTECTOMY  2008  . EYE SURGERY    .  INTRAOPERATIVE TRANSESOPHAGEAL ECHOCARDIOGRAM N/A 01/19/2014   Procedure: INTRAOPERATIVE TRANSESOPHAGEAL ECHOCARDIOGRAM;  Surgeon: Sherren Mocha, MD;  [facility]: [facility] OR;  Service: Open Heart Surgery;  Laterality: N/A;  . KNEE ARTHROSCOPY Bilateral   . LEFT AND RIGHT HEART CATHETERIZATION  WITH CORONARY ANGIOGRAM N/A 12/08/2013   Procedure: LEFT AND RIGHT HEART CATHETERIZATION WITH CORONARY ANGIOGRAM;  Surgeon: Sanda Klein, MD;  Location: [place] CATH LAB;  Service: Cardiovascular;  Laterality: N/A;  . REFRACTIVE SURGERY Bilateral   . REPLACEMENT TOTAL KNEE Right   . TONSILLECTOMY     70 years ago  . TOTAL KNEE ARTHROPLASTY Right 04/28/2014   Procedure: TOTAL KNEE ARTHROPLASTY;  Surgeon: Kathryne Hitch, MD;  Location: [place];  Service: Orthopedics;  Laterality: Right;  . TRANSCATHETER AORTIC VALVE REPLACEMENT, TRANSFEMORAL N/A 01/19/2014   Procedure: TRANSCATHETER AORTIC VALVE REPLACEMENT, TRANSFEMORAL;  Surgeon: Sherren Mocha, MD;  Location: [place];  Service: Open Heart Surgery;  Laterality: N/A;   Social History:   reports that she has never smoked. She has never used smokeless tobacco. She reports that she does not drink alcohol or use drugs.  Family History  Problem Relation Age of Onset  . Muscular dystrophy Father        Died at 69  . Stroke Mother   . Ovarian cancer Sister   . Breast cancer Sister   . Heart attack Sister        Died at 78  . Colon cancer Neg Hx   . Hypertension Neg Hx     Medications: Patient's Medications  New Prescriptions   No medications on file  Previous Medications   AMLODIPINE (NORVASC) 10 MG TABLET    Take 1 tablet (10 mg total) by mouth daily.   CARVEDILOL (COREG) 12.5 MG TABLET    Take 1 tablet by mouth twice daily   DENOSUMAB (PROLIA) 60 MG/ML SOSY INJECTION    Inject 60 mg into the skin every 6 (six) months.   EZETIMIBE (ZETIA) 10 MG TABLET    Take 1 tablet (10 mg total) by mouth daily.   FENOFIBRATE 160 MG TABLET    Take 1 tablet (160 mg total) by mouth daily.   FLUTICASONE (FLONASE) 50 MCG/ACT NASAL SPRAY    Use one spray in each nostril twice daily as needed for congestion   IBUPROFEN (ADVIL) 200 MG TABLET    Take 200 mg by mouth every 6 (six) hours as needed for mild pain or moderate pain.   METOCLOPRAMIDE (REGLAN) 5 MG TABLET     Take 5 mg by mouth 2 (two) times daily.   MIRABEGRON ER (MYRBETRIQ) 25 MG TB24 TABLET    Take 1 tablet (25 mg total) by mouth daily.   MIRTAZAPINE (REMERON) 15 MG TABLET    Take 15 mg by mouth at bedtime.   MULTIPLE VITAMINS-MINERALS (ICAPS AREDS 2) CAPS    Take 2 capsules by mouth daily.   ONDANSETRON (ZOFRAN) 4 MG TABLET    Take 1 tablet (4 mg total) by mouth every 8 (eight) hours as needed for nausea.   PANTOPRAZOLE (PROTONIX) 40 MG TABLET    Take 1 tablet (40 mg total) by mouth 2 (two) times daily.  Modified Medications   No medications on file  Discontinued Medications   CEPHALEXIN (KEFLEX) 500 MG CAPSULE    2 caps po bid x 7 days    Physical Exam:  Vitals:   04/03/19 0856  BP: (!) 150/80  Pulse: 71  Temp: 97.8 F (36.6 C)  TempSrc: Temporal  SpO2:  95%  Weight: 129 lb 12.8 oz (58.9 kg)  Height: 5' (1.524 m)   Body mass index is 25.35 kg/m. Wt Readings from Last 3 Encounters:  04/03/19 129 lb 12.8 oz (58.9 kg)  02/03/19 132 lb (59.9 kg)  01/31/19 135 lb (61.2 kg)    Physical Exam Constitutional:      General: She is not in acute distress.    Appearance: She is well-developed. She is not diaphoretic.  HENT:     Head: Normocephalic and atraumatic.  Eyes:     Conjunctiva/sclera: Conjunctivae normal.     Pupils: Pupils are equal, round, and reactive to light.  Cardiovascular:     Rate and Rhythm: Normal rate and regular rhythm.     Heart sounds: Normal heart sounds.  Pulmonary:     Effort: Pulmonary effort is normal.     Breath sounds: Normal breath sounds.  Abdominal:     General: Bowel sounds are normal.     Palpations: Abdomen is soft.  Musculoskeletal:        General: No tenderness.     Cervical back: Normal range of motion and neck supple.     Right lower leg: No edema.     Left lower leg: No edema.  Skin:    General: Skin is warm and dry.  Neurological:     Mental Status: She is alert and oriented to person, place, and time.     Gait: Gait abnormal  (cautious, uses cane).  Psychiatric:        Mood and Affect: Mood normal.        Behavior: Behavior normal.     Labs reviewed: Basic Metabolic Panel: Recent Labs    09/24/18 0954 01/31/19 1430 02/03/19 0948  NA 141 139 140  K 4.8 3.7 4.7  CL 107 103 106  CO2 25 24 24   GLUCOSE 93 101* 98  BUN 26* 20 25  CREATININE 1.52* 1.66* 1.49*  CALCIUM 9.8 8.9 9.6   Liver Function Tests: Recent Labs    09/24/18 0954 01/31/19 1430  AST 17 27  ALT 15 25  ALKPHOS  --  32*  BILITOT 0.4 0.6  PROT 7.4 7.4  ALBUMIN  --  3.7   No results for input(s): LIPASE, AMYLASE in the last 8760 hours. No results for input(s): AMMONIA in the last 8760 hours. CBC: Recent Labs    09/24/18 0954 01/31/19 1430 02/03/19 0948  WBC 6.2 6.9 5.6  NEUTROABS 3,410 3.4 2,873  HGB 11.0* 10.2* 10.9*  HCT 34.7* 33.2* 34.1*  MCV 89.2 92.0 88.8  PLT 334 397 401*   Lipid Panel: Recent Labs    09/24/18 0954  CHOL 192  HDL 54  LDLCALC 116*  TRIG 111  CHOLHDL 3.6   TSH: No results for input(s): TSH in the last 8760 hours. A1C: Lab Results  Component Value Date   HGBA1C 5.4 01/15/2014     Assessment/Plan 1. Osteoporosis without current pathological fracture, unspecified osteoporosis type -continue cal, vit d with prolia, unable to do much weight bearing activity due to back pain. - denosumab (PROLIA) injection 60 mg - DG Bone Density; Future  2. Chronic diastolic heart failure (HCC) -stable, continues to follow up with cardiologist routinely. No worsening swelling, shortness of breath or weight gain.  3. CKD (chronic kidney disease) stage 4, GFR 15-29 ml/min (HCC) Encourage proper hydration and to avoid NSAIDS (Aleve, Advil, Motrin, Ibuprofen)   4. DDD (degenerative disc disease), lumbar -stable. Unable do to much activity due  to pain. Pain medications make her sick and injections and PT have not been effective. Supportive care.  5. Gastroesophageal reflux disease, unspecified whether  esophagitis present -stable on protonix and now using reglan to help with GI motility.   6. Essential hypertension Did not take medication this morning. Will continue current regimen.   7. Hyperlipidemia, unspecified hyperlipidemia type -discussed risk and benefit of cholesterol reduction medication. At this time opted to stop medication due to pill burden.   8. Advance care planning -daughter will review MOST form and make appt to complete. - DNR (Do Not Resuscitate)  9. Estrogen deficiency - DG Bone Density; Future  Next appt: 4 weeks for AWV and advance directives Claudette Wermuth K. Cando, [facility] [phone]

## 2019-04-03 NOTE — Patient Instructions (Addendum)
To make follow up in 4 weeks for MOST form completion  To stop FENOFIBRATE AND ZETIA

## 2019-04-04 ENCOUNTER — Ambulatory Visit: Payer: Medicare Other | Attending: Internal Medicine

## 2019-04-04 DIAGNOSIS — Z23 Encounter for immunization: Secondary | ICD-10-CM | POA: Insufficient documentation

## 2019-04-04 NOTE — Progress Notes (Signed)
   Covid-19 Vaccination Clinic  Name:  [name]    MRN: [id number] DOB: 03/09/1925  04/04/2019  [name] was observed post Covid-19 immunization for 15 minutes without incidence. She was provided with Vaccine Information Sheet and instruction to access the V-Safe system.   [name] was instructed to call 911 with any severe reactions post vaccine: Marland Kitchen Difficulty breathing  . Swelling of your face and throat  . A fast heartbeat  . A bad rash all over your body  . Dizziness and weakness    Immunizations Administered    Name Date Dose VIS Date Route   Pfizer COVID-19 Vaccine 04/04/2019 10:26 AM 0.3 mL 01/30/2019 Intramuscular   Manufacturer: [place]   Lot: [id number]   [place]: [address]-1

## 2019-04-27 ENCOUNTER — Encounter: Payer: Self-pay | Admitting: Internal Medicine

## 2019-04-27 ENCOUNTER — Ambulatory Visit (INDEPENDENT_AMBULATORY_CARE_PROVIDER_SITE_OTHER): Payer: Medicare Other | Admitting: Internal Medicine

## 2019-04-27 ENCOUNTER — Other Ambulatory Visit: Payer: Self-pay

## 2019-04-27 VITALS — BP 122/72 | HR 71 | Temp 96.8°F | Ht 60.0 in | Wt 131.0 lb

## 2019-04-27 DIAGNOSIS — R471 Dysarthria and anarthria: Secondary | ICD-10-CM

## 2019-04-27 DIAGNOSIS — I10 Essential (primary) hypertension: Secondary | ICD-10-CM | POA: Diagnosis not present

## 2019-04-27 DIAGNOSIS — R413 Other amnesia: Secondary | ICD-10-CM

## 2019-04-27 DIAGNOSIS — G459 Transient cerebral ischemic attack, unspecified: Secondary | ICD-10-CM

## 2019-04-27 DIAGNOSIS — M4807 Spinal stenosis, lumbosacral region: Secondary | ICD-10-CM

## 2019-04-27 DIAGNOSIS — E785 Hyperlipidemia, unspecified: Secondary | ICD-10-CM

## 2019-04-27 MED ORDER — ASPIRIN EC 81 MG PO TBEC: 81.0000 mg | DELAYED_RELEASE_TABLET | Freq: Every day | ORAL | 3 refills | Status: AC

## 2019-04-27 NOTE — Progress Notes (Signed)
Location:  [facility] clinic  Provider: Dr. Hollace Kinnier  Goals of Care:  Advanced Directives 04/27/2019  Does Patient Have a Medical Advance Directive? Yes  Type of Paramedic of [place];Out of facility DNR (pink MOST or yellow form)  Does patient want to make changes to medical advance directive? No - Patient declined  Copy of [place] in Chart? -     Chief Complaint  Patient presents with  . Acute Visit    Memory, confused words, sleeping alot, referral for MRI     HPI: Patient is a 86 y.o. female seen today for medical management of chronic diseases.    Daughter present for visit.   She has completed her covid vaccine series.   Family worried she is having mini-strokes. These episodes started last Friday and vary in time. Daughter and son have witnessed two events since Friday. Patient also states she has had one episode. When these episodes occur, her speech changes, is delayed and garbaled. She denies any falls or injuries, one-sided weakness, trouble swallowing or facial drooping.   She lives alone. Family checks on her multiple times a day. Family is interested in getting her a lifealert. Ambulated with a cane.   Lower back pain is ongoing. Still taking ibuprofen for back pain.        Past Medical History:  Diagnosis Date  . Anxiety   . Arthritis    Both knees R>L  . Central stenosis of spinal canal    moderate L4/5 with L4/5 and L5/S1 disc bulges  . Chronic diastolic congestive heart failure, NYHA class 2 ([place])   . Facet joint syndrome    Left L2-3, L3-4, L4-5, L5-S1  . GERD (gastroesophageal reflux disease) 05/14/2011  . Heart murmur   . Hyperlipidemia   . Hypertension   . Left low back pain    without sacroiliac joint componet  . Lumbago   . Lumbosacral spondylosis without myelopathy   . 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 Severe aortic stenosis 12/06/2013  . Shortness of breath dyspnea   . Spinal stenosis, lumbosacral region     Past Surgical History:  Procedure Laterality Date  . CARPAL TUNNEL RELEASE Right 10/31/2015  . CATARACT EXTRACTION Bilateral   . CHOLECYSTECTOMY  2008  . EYE SURGERY    . INTRAOPERATIVE TRANSESOPHAGEAL ECHOCARDIOGRAM N/A 01/19/2014   Procedure: INTRAOPERATIVE TRANSESOPHAGEAL ECHOCARDIOGRAM;  Surgeon: Sherren Mocha, MD;  Location: [facility] OR;  Service: Open Heart Surgery;  Laterality: N/A;  . KNEE ARTHROSCOPY Bilateral   . LEFT AND RIGHT HEART CATHETERIZATION WITH CORONARY ANGIOGRAM N/A 12/08/2013   Procedure: LEFT AND RIGHT HEART CATHETERIZATION WITH CORONARY ANGIOGRAM;  Surgeon: Sanda Klein, MD;  Location: [place] CATH LAB;  Service: Cardiovascular;  Laterality: N/A;  . REFRACTIVE SURGERY Bilateral   . REPLACEMENT TOTAL KNEE Right   . TONSILLECTOMY     70 years ago  . TOTAL KNEE ARTHROPLASTY Right 04/28/2014   Procedure: TOTAL KNEE ARTHROPLASTY;  Surgeon: Kathryne Hitch, MD;  Location: [place];  Service: Orthopedics;  Laterality: Right;  . TRANSCATHETER AORTIC VALVE REPLACEMENT, TRANSFEMORAL N/A 01/19/2014   Procedure: TRANSCATHETER AORTIC VALVE REPLACEMENT, TRANSFEMORAL;  Surgeon: Sherren Mocha, MD;  Location: [place];  Service: Open Heart Surgery;  Laterality: N/A;    Allergies  Allergen Reactions  . Statins     pain  . Aspirin Nausea And Vomiting  .  Codeine Nausea And Vomiting  . Phenytoin Nausea And Vomiting    Outpatient Encounter Medications as of 04/27/2019  Medication Sig  . amLODipine (NORVASC) 10 MG tablet Take 1 tablet (10 mg total) by mouth daily.  . carvedilol (COREG) 12.5 MG tablet Take 1 tablet by mouth twice daily  . denosumab (PROLIA) 60 MG/ML SOSY injection Inject 60 mg into the skin every 6 (six) months.  . fluticasone (FLONASE) 50 MCG/ACT nasal spray Use one spray in each nostril twice daily as needed for congestion  . ibuprofen  (ADVIL) 200 MG tablet Take 200 mg by mouth every 6 (six) hours as needed for mild pain or moderate pain.  Marland Kitchen metoCLOPramide (REGLAN) 5 MG tablet Take 5 mg by mouth 2 (two) times daily.  . mirabegron ER (MYRBETRIQ) 25 MG TB24 tablet Take 1 tablet (25 mg total) by mouth daily.  . mirtazapine (REMERON) 15 MG tablet Take 15 mg by mouth at bedtime.  . Multiple Vitamins-Minerals (ICAPS AREDS 2) CAPS Take 2 capsules by mouth daily.  . ondansetron (ZOFRAN) 4 MG tablet Take 1 tablet (4 mg total) by mouth every 8 (eight) hours as needed for nausea.  . pantoprazole (PROTONIX) 40 MG tablet Take 1 tablet (40 mg total) by mouth 2 (two) times daily.   No facility-administered encounter medications on file as of 04/27/2019.    Review of Systems:  Review of Systems  Constitutional: Negative for activity change and appetite change.  HENT: Positive for voice change. Negative for drooling and trouble swallowing.   Respiratory: Negative for chest tightness and shortness of breath.   Cardiovascular: Negative for chest pain, palpitations and leg swelling.  Musculoskeletal: Positive for back pain.  Skin: Negative.   Neurological: Positive for speech difficulty. Negative for weakness.  Psychiatric/Behavioral: Negative for confusion, dysphoric mood and sleep disturbance. The patient is not nervous/anxious.     Health Maintenance  Topic Date Due  . TETANUS/TDAP  03/20/2023 (Originally 04/09/1944)  . MAMMOGRAM  06/19/2023 (Originally 01/15/2017)  . INFLUENZA VACCINE  Completed  . DEXA SCAN  Completed  . PNA vac Low Risk Adult  Completed    Physical Exam: Vitals:   04/27/19 1112  BP: 122/72  Pulse: 71  Temp: (!) 96.8 F (36 C)  TempSrc: Temporal  SpO2: 95%  Weight: 131 lb (59.4 kg)  Height: 5' (1.524 m)   Body mass index is 25.58 kg/m. Physical Exam Vitals reviewed.  Constitutional:      Appearance: Normal appearance. She is normal weight.  HENT:     Head: Normocephalic.     Mouth/Throat:      Mouth: Mucous membranes are moist.  Cardiovascular:     Rate and Rhythm: Normal rate and regular rhythm.     Pulses: Normal pulses.     Heart sounds: Murmur present.  Pulmonary:     Effort: Pulmonary effort is normal. No respiratory distress.     Breath sounds: Normal breath sounds. No wheezing.  Abdominal:     General: Abdomen is flat. Bowel sounds are normal.     Palpations: Abdomen is soft.  Musculoskeletal:        General: Normal range of motion.     Right lower leg: No edema.     Left lower leg: No edema.  Skin:    General: Skin is warm and dry.     Capillary Refill: Capillary refill takes less than 2 seconds.  Neurological:     General: No focal deficit present.     Mental Status:  She is alert and oriented to person, place, and time. Mental status is at baseline.     Cranial Nerves: Cranial nerves are intact.     Sensory: Sensation is intact.     Motor: Motor function is intact. No weakness.     Coordination: Coordination is intact. Coordination normal.     Gait: Gait is intact.     Deep Tendon Reflexes: Reflexes normal.     Comments: No facial droop. Smile symmetrical.   Psychiatric:        Mood and Affect: Mood normal.        Behavior: Behavior normal.        Thought Content: Thought content normal.        Judgment: Judgment normal.     Labs reviewed: Basic Metabolic Panel: Recent Labs    09/24/18 0954 01/31/19 1430 02/03/19 0948  NA 141 139 140  K 4.8 3.7 4.7  CL 107 103 106  CO2 25 24 24   GLUCOSE 93 101* 98  BUN 26* 20 25  CREATININE 1.52* 1.66* 1.49*  CALCIUM 9.8 8.9 9.6   Liver Function Tests: Recent Labs    09/24/18 0954 01/31/19 1430  AST 17 27  ALT 15 25  ALKPHOS  --  32*  BILITOT 0.4 0.6  PROT 7.4 7.4  ALBUMIN  --  3.7   No results for input(s): LIPASE, AMYLASE in the last 8760 hours. No results for input(s): AMMONIA in the last 8760 hours. CBC: Recent Labs    09/24/18 0954 01/31/19 1430 02/03/19 0948  WBC 6.2 6.9 5.6  NEUTROABS  3,410 3.4 2,873  HGB 11.0* 10.2* 10.9*  HCT 34.7* 33.2* 34.1*  MCV 89.2 92.0 88.8  PLT 334 397 401*   Lipid Panel: Recent Labs    09/24/18 0954  CHOL 192  HDL 54  LDLCALC 116*  TRIG 111  CHOLHDL 3.6   Lab Results  Component Value Date   HGBA1C 5.4 01/15/2014    Procedures since last visit: No results found.  Assessment/Plan 1. TIA (transient ischemic attack) - suspect she is having mini strokes  - her health history of hypertension, CAD, and hyperlipidemia put her at a higher risk for a CVA  - CT head Wo Contrast- STAT - US Carotid Duplex Bilateral  2. Dysarthria -she has been experiencing episodes of slurred speech - will order CT of head W/O contrast to r/o CVA  3. Hyperlipidemia, unspecified hyperlipidemia type - LDL was slightly elevated in the past - recommend avoiding foods high in fat or fried foods - lipid panel- future  4. Essential hypertension - bp 122/72, at goal - continue amlodipine and carvedilol regimen - continue low sodium diet  5. Lumbosacral stenosis - chronic, ongoing - has seen many specialists in the past - continue advil PRN for back pain  6. Memory loss - daughter states she has been having issues with short term memory - she has a family history of dementia - MMSE scheduled for next week      Labs/tests ordered:  CT head Wo Contrast, US Carotid Duplex Bilateral Next appt:  05/01/2019

## 2019-04-27 NOTE — Patient Instructions (Addendum)
Start aspirin 81 mg daily. I've order CT of your brain and carotid doppler.  If you have recurrence of your symptoms, please go straight to the ED.  Transient Ischemic Attack A transient ischemic attack (TIA) is a "warning stroke" that causes stroke-like symptoms that then go away quickly. The symptoms of a TIA come on suddenly, and they last less than 24 hours. Unlike a stroke, a TIA does not cause permanent damage to the brain. It is important to know the symptoms of a TIA and what to do. Seek medical care right away, even if your symptoms go away. Having a TIA is a sign that you are at higher risk for a permanent stroke. Lifestyle changes and medical treatments can help prevent a stroke. What are the causes? This condition is caused by a temporary blockage in an artery in the head or neck. The blockage does not allow the brain to get the blood supply it needs and can cause various symptoms. The blockage can be caused by:  Fatty buildup in an artery in the head or neck (atherosclerosis).  A blood clot.  Tearing of an artery (dissection).  Inflammation of an artery (vasculitis). Sometimes the cause is not known. What increases the risk? Certain factors may make you more likely to develop this condition. Some of these factors are things that you can change, such as:  Obesity.  Using products that contain nicotine or tobacco, such as cigarettes and e-cigarettes.  Taking oral birth control, especially if you also use tobacco.  Lack of physical activity.  Excessive use of alcohol.  Use of drugs, especially cocaine and methamphetamine. Other risk factors include:  High blood pressure (hypertension).  High cholesterol.  Diabetes mellitus.  Heart disease (coronary artery disease).  Atrial fibrillation.  Being over the age of 42.  Being female.  Family history of stroke.  Previous history of blood clots, stroke, TIA, or heart attack.  Sickle cell disease.  Being a woman  with a history of preeclampsia.  Migraine headache.  Sleep apnea.  Chronic inflammatory diseases, such as rheumatoid arthritis or lupus.  Blood clotting disorders (hypercoagulable state). What are the signs or symptoms? Symptoms of this condition are the same as those of a stroke, but they are temporary. The symptoms develop suddenly, and they go away quickly, usually within minutes to hours. Symptoms may include sudden:  Weakness or numbness in your face, arm, or leg, especially on one side of your body.  Trouble walking or difficulty moving your arms or legs.  Trouble speaking, understanding speech, or both (aphasia).  Vision changes in one or both eyes. These include double vision, blurred vision, or loss of vision.  Dizziness.  Confusion.  Loss of balance or coordination.  Nausea and vomiting.  Severe headache with no known cause. If possible, make note of the exact time that you last felt like your normal self and what time your symptoms started. Tell your health care provider. How is this diagnosed? This condition may be diagnosed based on:  Your symptoms and medical history.  A physical exam.  Imaging tests, usually a CT or MRI scan of the brain.  Blood tests. You may also have other tests, including:  Electrocardiogram (ECG).  Echocardiogram.  Carotid ultrasound.  A scan of the brain circulation (CT angiogram or MRI angiogram).  Continuous heart monitoring. How is this treated? The goal of treatment is to reduce the risk for a subsequent stroke. Treatment may include stroke prevention therapies such as:  Changes  to diet or lifestyle to decrease your risk. Lifestyle changes may include exercising and stopping smoking.  Medicines to thin the blood (antiplatelets or anticoagulants).  Blood pressure medicines.  Medicines to reduce cholesterol.  Treating other health conditions, such as diabetes or atrial fibrillation. If testing shows that you have  narrowing in the arteries to your brain, your health care provider may recommend a procedure, such as:  Carotid endarterectomy. This is a surgery to remove the blockage from your artery.  Carotid angioplasty and stenting. This is a procedure to open or widen an artery in the neck using a metal mesh tube (stent). The stent helps keep the artery open by supporting the artery walls. Follow these instructions at home: Medicines  Take over-the-counter and prescription medicines only as told by your health care provider.  If you were told to take a medicine to thin your blood, such as aspirin or an anticoagulant, take it exactly as told by your health care provider. ? Taking too much blood-thinning medicine can cause bleeding. ? If you do not take enough blood-thinning medicine, you will not have the protection that you need against a stroke and other problems. Eating and drinking   Eat 5 or more servings of fruits and vegetables each day.  Follow instructions from your health care provider about diet. You may need to follow a certain nutrition plan to help manage risk factors for stroke, such as high blood pressure, high cholesterol, diabetes, or obesity. This may include: ? Eating a low-fat, low-salt diet. ? Including a lot of fiber in your diet. ? Limiting the amount of carbohydrates and sugar in your diet.  Limit alcohol intake to no more than 1 drink a day for nonpregnant women and 2 drinks a day for men. One drink equals 12 oz of beer, 5 oz of wine, or 1 oz of hard liquor. General instructions  Maintain a healthy weight.  Stay physically active. Try to get at least 30 minutes of exercise on most or all days.  Find out if you have sleep apnea, and seek treatment if needed.  Do not use any products that contain nicotine or tobacco, such as cigarettes and e-cigarettes. If you need help quitting, ask your health care provider.  Do not abuse drugs.  Keep all follow-up visits as told  by your health care provider. This is important. Where to find more information  American Stroke Association: www.strokeassociation.org  National Stroke Association: www.stroke.org Get help right away if:  You have chest pain or an irregular heartbeat.  You have any symptoms of stroke. The acronym BEFAST is an easy way to remember the main warning signs of stroke. ? B - Balance problems. Signs include dizziness, sudden trouble walking, or loss of balance. ? E - Eye problems. This includes trouble seeing or a sudden change in vision. ? F - Face changes. This includes sudden weakness or numbness of the face, or the face or eyelid drooping to one side. ? A - Arm weakness or numbness. This happens suddenly and usually on one side of the body. ? S - Speech problems. This includes trouble speaking or trouble understanding speech. ? T - Time. Time to call 911 or seek emergency care. Do not wait to see if symptoms will go away. Make note of the time your symptoms started.  Other signs of stroke may include: ? A sudden, severe headache with no known cause. ? Nausea or vomiting. ? Seizure. These symptoms may represent a serious problem  that is an emergency. Do not wait to see if the symptoms will go away. Get medical help right away. Call your local emergency services (911 in the [place] Do not drive yourself to the hospital. Summary  A TIA happens when an artery in the head or neck is blocked, leading to stroke-like symptoms that then go away quickly. The blockage clears before there is any permanent brain damage. A TIA is a medical emergency and requires immediate medical attention.  Symptoms of this condition are the same as those of a stroke, but they are temporary. The symptoms usually develop suddenly, and they go away quickly, usually within minutes to hours.  Having a TIA means that you are at high risk of a stroke in the near future.  Treatment may include medicines to thin the blood as  well as medicines, diet changes, and lifestyle changes to manage conditions that increase the risk of another TIA or a stroke. This information is not intended to replace advice given to you by your health care provider. Make sure you discuss any questions you have with your health care provider. Document Revised: 08/15/2018 Document Reviewed: 08/15/2018 Elsevier Patient Education  [place].  Stroke Prevention Some medical conditions and lifestyle choices can lead to a higher risk for a stroke. You can help to prevent a stroke by making nutrition, lifestyle, and other changes. What nutrition changes can be made?   Eat healthy foods. ? Choose foods that are high in fiber. These include:  Fresh fruits.  Fresh vegetables.  Whole grains. ? Eat at least 5 or more servings of fruits and vegetables each day. Try to fill half of your plate at each meal with fruits and vegetables. ? Choose lean protein foods. These include:  Lowfat (lean) cuts of meat.  Chicken without skin.  Fish.  Tofu.  Beans.  Nuts. ? Eat low-fat dairy products. ? Avoid foods that:  Are high in salt (sodium).  Have saturated fat.  Have trans fat.  Have cholesterol.  Are processed.  Are premade.  Follow eating guidelines as told by your doctor. These may include: ? Reducing how many calories you eat and drink each day. ? Limiting how much salt you eat or drink each day to 1,500 milligrams (mg). ? Using only healthy fats for cooking. These include:  Olive oil.  Canola oil.  Sunflower oil. ? Counting how many carbohydrates you eat and drink each day. What lifestyle changes can be made?  Try to stay at a healthy weight. Talk to your doctor about what a good weight is for you.  Get at least 30 minutes of moderate physical activity at least 5 days a week. This can include: ? Fast walking. ? Biking. ? Swimming.  Do not use any products that have nicotine or tobacco. This includes  cigarettes and e-cigarettes. If you need help quitting, ask your doctor. Avoid being around tobacco smoke in general.  Limit how much alcohol you drink to no more than 1 drink a day for nonpregnant women and 2 drinks a day for men. One drink equals 12 oz of beer, 5 oz of wine, or 1 oz of hard liquor.  Do not use drugs.  Avoid taking birth control pills. Talk to your doctor about the risks of taking birth control pills if: ? You are over 6 years old. ? You smoke. ? You get migraines. ? You have had a blood clot. What other changes can be made?  Manage your cholesterol. ?  It is important to eat a healthy diet. ? If your cholesterol cannot be managed through your diet, you may also need to take medicines. Take medicines as told by your doctor.  Manage your diabetes. ? It is important to eat a healthy diet and to exercise regularly. ? If your blood sugar cannot be managed through diet and exercise, you may need to take medicines. Take medicines as told by your doctor.  Control your high blood pressure (hypertension). ? Try to keep your blood pressure below 130/80. This can help lower your risk of stroke. ? It is important to eat a healthy diet and to exercise regularly. ? If your blood pressure cannot be managed through diet and exercise, you may need to take medicines. Take medicines as told by your doctor. ? Ask your doctor if you should check your blood pressure at home. ? Have your blood pressure checked every year. Do this even if your blood pressure is normal.  Talk to your doctor about getting checked for a sleep disorder. Signs of this can include: ? Snoring a lot. ? Feeling very tired.  Take over-the-counter and prescription medicines only as told by your doctor. These may include aspirin or blood thinners (antiplatelets or anticoagulants).  Make sure that any other medical conditions you have are managed. Where to find more information  American Stroke Association:  www.strokeassociation.org  National Stroke Association: www.stroke.org Get help right away if:  You have any symptoms of stroke. "BE FAST" is an easy way to remember the main warning signs: ? B - Balance. Signs are dizziness, sudden trouble walking, or loss of balance. ? E - Eyes. Signs are trouble seeing or a sudden change in how you see. ? F - Face. Signs are sudden weakness or loss of feeling of the face, or the face or eyelid drooping on one side. ? A - Arms. Signs are weakness or loss of feeling in an arm. This happens suddenly and usually on one side of the body. ? S - Speech. Signs are sudden trouble speaking, slurred speech, or trouble understanding what people say. ? T - Time. Time to call emergency services. Write down what time symptoms started.  You have other signs of stroke, such as: ? A sudden, very bad headache with no known cause. ? Feeling sick to your stomach (nausea). ? Throwing up (vomiting). ? Jerky movements you cannot control (seizure). These symptoms may represent a serious problem that is an emergency. Do not wait to see if the symptoms will go away. Get medical help right away. Call your local emergency services (911 in the [place] Do not drive yourself to the hospital. Summary  You can prevent a stroke by eating healthy, exercising, not smoking, drinking less alcohol, and treating other health problems, such as diabetes, high blood pressure, or high cholesterol.  Do not use any products that contain nicotine or tobacco, such as cigarettes and e-cigarettes.  Get help right away if you have any signs or symptoms of a stroke. This information is not intended to replace advice given to you by your health care provider. Make sure you discuss any questions you have with your health care provider. Document Revised: 04/03/2018 Document Reviewed: 05/09/2016 Elsevier Patient Education  [place].

## 2019-04-27 NOTE — Addendum Note (Signed)
Addended by: Gayland Curry on: 04/27/2019 01:45 PM   Modules accepted: Orders

## 2019-04-29 ENCOUNTER — Telehealth: Payer: Self-pay | Admitting: Internal Medicine

## 2019-04-29 ENCOUNTER — Other Ambulatory Visit: Payer: Self-pay

## 2019-04-29 ENCOUNTER — Ambulatory Visit
Admission: RE | Admit: 2019-04-29 | Discharge: 2019-04-29 | Disposition: A | Discharge status: Home / Self Care | Payer: Medicare Other | Source: Ambulatory Visit | Attending: Internal Medicine | Admitting: Internal Medicine

## 2019-04-29 DIAGNOSIS — R2689 Other abnormalities of gait and mobility: Secondary | ICD-10-CM | POA: Diagnosis not present

## 2019-04-29 DIAGNOSIS — R471 Dysarthria and anarthria: Secondary | ICD-10-CM

## 2019-04-29 DIAGNOSIS — G459 Transient cerebral ischemic attack, unspecified: Secondary | ICD-10-CM

## 2019-04-29 NOTE — Telephone Encounter (Signed)
-----   Message from Despina Hidden, [place] sent at 04/29/2019  1:22 PM EST ----- Spoke with [name] and she would like to do the MRI.

## 2019-04-29 NOTE — Telephone Encounter (Signed)
MR/MRI has been ordered.

## 2019-04-29 NOTE — Progress Notes (Signed)
Please call [name] and notify her: CT was done due to difficulty with speech in suspected TIA (indication on report reads gait disturbance).   It shows some mild shrinkage of the entire brain, but it is worse in the front and sides which are areas where speech is regulated.  She also has some circulation difficulty in the parts of the brain responsible for executive functioning (doing things for oneself day to day).  One of those areas shows a small stroke there that the radiologist feels might be new.   I recommend that rather than doing the carotid dopplers I originally ordered, we check MRI/MRA of her brain which will tell us more about her brain changes and her blood vessels that supply her brain.

## 2019-04-29 NOTE — Telephone Encounter (Signed)
Spoke with sister and advised MRI ordered, she will wait for the appt

## 2019-05-01 ENCOUNTER — Encounter: Payer: Self-pay | Admitting: Nurse Practitioner

## 2019-05-01 ENCOUNTER — Telehealth: Payer: Self-pay

## 2019-05-01 ENCOUNTER — Telehealth (INDEPENDENT_AMBULATORY_CARE_PROVIDER_SITE_OTHER): Payer: Medicare Other | Admitting: Nurse Practitioner

## 2019-05-01 ENCOUNTER — Other Ambulatory Visit: Payer: Self-pay

## 2019-05-01 ENCOUNTER — Ambulatory Visit (INDEPENDENT_AMBULATORY_CARE_PROVIDER_SITE_OTHER): Payer: Medicare Other | Admitting: Nurse Practitioner

## 2019-05-01 DIAGNOSIS — Z0000 Encounter for general adult medical examination without abnormal findings: Secondary | ICD-10-CM | POA: Diagnosis not present

## 2019-05-01 DIAGNOSIS — Z7189 Other specified counseling: Secondary | ICD-10-CM

## 2019-05-01 DIAGNOSIS — Z8673 Personal history of transient ischemic attack (TIA), and cerebral infarction without residual deficits: Secondary | ICD-10-CM | POA: Diagnosis not present

## 2019-05-01 NOTE — Patient Instructions (Addendum)
[name] , Thank you for taking time to come for your Medicare Wellness Visit. I appreciate your ongoing commitment to your health goals. Please review the following plan we discussed and let me know if I can assist you in the future.   Screening recommendations/referrals: Colonoscopy aged out screening Mammogram aged out of screening Bone Density scheduled Recommended yearly ophthalmology/optometry visit for glaucoma screening and checkup Recommended yearly dental visit for hygiene and checkup  Vaccinations: Influenza vaccine up to date Pneumococcal vaccine up to date Tdap vaccine recommended to get at local pharmacy Shingles vaccine - to request records for shingrix  Advanced directives: on file.   Conditions/risks identified: falls, progressive memory loss, cardiovascular event   Next appointment: 1 year   Preventive Care 1 Years and Older, Female Preventive care refers to lifestyle choices and visits with your health care provider that can promote health and wellness. What does preventive care include?  A yearly physical exam. This is also called an annual well check.  Dental exams once or twice a year.  Routine eye exams. Ask your health care provider how often you should have your eyes checked.  Personal lifestyle choices, including:  Daily care of your teeth and gums.  Regular physical activity.  Eating a healthy diet.  Avoiding tobacco and drug use.  Limiting alcohol use.  Practicing safe sex.  Taking low-dose aspirin every day.  Taking vitamin and mineral supplements as recommended by your health care provider. What happens during an annual well check? The services and screenings done by your health care provider during your annual well check will depend on your age, overall health, lifestyle risk factors, and family history of disease. Counseling  Your health care provider may ask you questions about your:  Alcohol use.  Tobacco use.  Drug  use.  Emotional well-being.  Home and relationship well-being.  Sexual activity.  Eating habits.  History of falls.  Memory and ability to understand (cognition).  Work and work Statistician.  Reproductive health. Screening  You may have the following tests or measurements:  Height, weight, and BMI.  Blood pressure.  Lipid and cholesterol levels. These may be checked every 5 years, or more frequently if you are over 82 years old.  Skin check.  Lung cancer screening. You may have this screening every year starting at age 42 if you have a 30-pack-year history of smoking and currently smoke or have quit within the past 15 years.  Fecal occult blood test (FOBT) of the stool. You may have this test every year starting at age 18.  Flexible sigmoidoscopy or colonoscopy. You may have a sigmoidoscopy every 5 years or a colonoscopy every 10 years starting at age 9.  Hepatitis C blood test.  Hepatitis B blood test.  Sexually transmitted disease (STD) testing.  Diabetes screening. This is done by checking your blood sugar (glucose) after you have not eaten for a while (fasting). You may have this done every 1-3 years.  Bone density scan. This is done to screen for osteoporosis. You may have this done starting at age 32.  Mammogram. This may be done every 1-2 years. Talk to your health care provider about how often you should have regular mammograms. Talk with your health care provider about your test results, treatment options, and if necessary, the need for more tests. Vaccines  Your health care provider may recommend certain vaccines, such as:  Influenza vaccine. This is recommended every year.  Tetanus, diphtheria, and acellular pertussis (Tdap, Td) vaccine.  You may need a Td booster every 10 years.  Zoster vaccine. You may need this after age 4.  Pneumococcal 13-valent conjugate (PCV13) vaccine. One dose is recommended after age 71.  Pneumococcal polysaccharide  (PPSV23) vaccine. One dose is recommended after age 55. Talk to your health care provider about which screenings and vaccines you need and how often you need them. This information is not intended to replace advice given to you by your health care provider. Make sure you discuss any questions you have with your health care provider. Document Released: 03/04/2015 Document Revised: 10/26/2015 Document Reviewed: 12/07/2014 Elsevier Interactive Patient Education  2017 [place] Prevention in the Home Falls can cause injuries. They can happen to people of all ages. There are many things you can do to make your home safe and to help prevent falls. What can I do on the outside of my home?  Regularly fix the edges of walkways and driveways and fix any cracks.  Remove anything that might make you trip as you walk through a door, such as a raised step or threshold.  Trim any bushes or trees on the path to your home.  Use bright outdoor lighting.  Clear any walking paths of anything that might make someone trip, such as rocks or tools.  Regularly check to see if handrails are loose or broken. Make sure that both sides of any steps have handrails.  Any raised decks and porches should have guardrails on the edges.  Have any leaves, snow, or ice cleared regularly.  Use sand or salt on walking paths during winter.  Clean up any spills in your garage right away. This includes oil or grease spills. What can I do in the bathroom?  Use night lights.  Install grab bars by the toilet and in the tub and shower. Do not use towel bars as grab bars.  Use non-skid mats or decals in the tub or shower.  If you need to sit down in the shower, use a plastic, non-slip stool.  Keep the floor dry. Clean up any water that spills on the floor as soon as it happens.  Remove soap buildup in the tub or shower regularly.  Attach bath mats securely with double-sided non-slip rug tape.  Do not have  throw rugs and other things on the floor that can make you trip. What can I do in the bedroom?  Use night lights.  Make sure that you have a light by your bed that is easy to reach.  Do not use any sheets or blankets that are too big for your bed. They should not hang down onto the floor.  Have a firm chair that has side arms. You can use this for support while you get dressed.  Do not have throw rugs and other things on the floor that can make you trip. What can I do in the kitchen?  Clean up any spills right away.  Avoid walking on wet floors.  Keep items that you use a lot in easy-to-reach places.  If you need to reach something above you, use a strong step stool that has a grab bar.  Keep electrical cords out of the way.  Do not use floor polish or wax that makes floors slippery. If you must use wax, use non-skid floor wax.  Do not have throw rugs and other things on the floor that can make you trip. What can I do with my stairs?  Do not leave any  items on the stairs.  Make sure that there are handrails on both sides of the stairs and use them. Fix handrails that are broken or loose. Make sure that handrails are as long as the stairways.  Check any carpeting to make sure that it is firmly attached to the stairs. Fix any carpet that is loose or worn.  Avoid having throw rugs at the top or bottom of the stairs. If you do have throw rugs, attach them to the floor with carpet tape.  Make sure that you have a light switch at the top of the stairs and the bottom of the stairs. If you do not have them, ask someone to add them for you. What else can I do to help prevent falls?  Wear shoes that:  Do not have high heels.  Have rubber bottoms.  Are comfortable and fit you well.  Are closed at the toe. Do not wear sandals.  If you use a stepladder:  Make sure that it is fully opened. Do not climb a closed stepladder.  Make sure that both sides of the stepladder are  locked into place.  Ask someone to hold it for you, if possible.  Clearly mark and make sure that you can see:  Any grab bars or handrails.  First and last steps.  Where the edge of each step is.  Use tools that help you move around (mobility aids) if they are needed. These include:  Canes.  Walkers.  Scooters.  Crutches.  Turn on the lights when you go into a dark area. Replace any light bulbs as soon as they burn out.  Set up your furniture so you have a clear path. Avoid moving your furniture around.  If any of your floors are uneven, fix them.  If there are any pets around you, be aware of where they are.  Review your medicines with your doctor. Some medicines can make you feel dizzy. This can increase your chance of falling. Ask your doctor what other things that you can do to help prevent falls. This information is not intended to replace advice given to you by your health care provider. Make sure you discuss any questions you have with your health care provider. Document Released: 12/02/2008 Document Revised: 07/14/2015 Document Reviewed: 03/12/2014 Elsevier Interactive Patient Education  2017 [employer].

## 2019-05-01 NOTE — Progress Notes (Signed)
Subjective:   [name] is a 86 y.o. female who presents for Medicare Annual (Subsequent) preventive examination.  Review of Systems:   Cardiac Risk Factors include: advanced age (>23men, >84 women);hypertension;sedentary lifestyle     Objective:     Vitals: There were no vitals taken for this visit.  There is no height or weight on file to calculate BMI.  Advanced Directives 05/01/2019 04/27/2019 04/03/2019 01/31/2019 09/24/2018 03/19/2018 08/14/2017  Does Patient Have a Medical Advance Directive? Yes Yes Yes Yes Yes Yes Yes  Type of Paramedic of [place];Living will [place];Out of facility DNR (pink MOST or yellow form) [place];Living will - [place];Living will [place];Living will [place];Living will  Does patient want to make changes to medical advance directive? No - Patient declined No - Patient declined No - Patient declined - No - Patient declined No - Patient declined -  Copy of [place] in Chart? Yes - validated most recent copy scanned in chart (See row information) - Yes - validated most recent copy scanned in chart (See row information) - Yes - validated most recent copy scanned in chart (See row information) Yes - validated most recent copy scanned in chart (See row information) Yes    Tobacco Social History   Tobacco Use  Smoking Status Never Smoker  Smokeless Tobacco Never Used     Counseling given: Not Answered   Clinical Intake:  Pre-visit preparation completed: Yes  Pain : 0-10 Pain Score: 5  Pain Type: Chronic pain Pain Location: Back Pain Orientation: Lower Pain Descriptors / Indicators: Aching Pain Onset: More than a month ago Pain Frequency: Intermittent Pain Relieving Factors: sitting down Effect of Pain on Daily Activities: unable to stand or do activities for extended periods of time  Pain Relieving  Factors: sitting down  BMI - recorded: 25 Nutritional Status: BMI 25 -29 Overweight Nutritional Risks: None Diabetes: No  How often do you need to have someone help you when you read instructions, pamphlets, or other written materials from your doctor or pharmacy?: 5 - Always(due to memory, daughter helps her)        Past Medical History:  Diagnosis Date  . Anxiety   . Arthritis    Both knees R>L  . Central stenosis of spinal canal    moderate L4/5 with L4/5 and L5/S1 disc bulges  . Chronic diastolic congestive heart failure, NYHA class 2 ([place])   . Facet joint syndrome    Left L2-3, L3-4, L4-5, L5-S1  . GERD (gastroesophageal reflux disease) 05/14/2011  . Heart murmur   . Hyperlipidemia   . Hypertension   . Left low back pain    without sacroiliac joint componet  . Lumbago   . Lumbosacral spondylosis without myelopathy   . 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 Severe aortic stenosis 12/06/2013  . Shortness of breath dyspnea   . Spinal stenosis, lumbosacral region    Past Surgical History:  Procedure Laterality Date  . CARPAL TUNNEL RELEASE Right 10/31/2015  . CATARACT EXTRACTION Bilateral   . CHOLECYSTECTOMY  2008  . EYE SURGERY    . INTRAOPERATIVE TRANSESOPHAGEAL ECHOCARDIOGRAM N/A 01/19/2014   Procedure: INTRAOPERATIVE TRANSESOPHAGEAL ECHOCARDIOGRAM;  Surgeon: Sherren Mocha, MD;  Location: [facility];  Service: Open Heart Surgery;  Laterality: N/A;  . KNEE ARTHROSCOPY  Bilateral   . LEFT AND RIGHT HEART CATHETERIZATION WITH CORONARY ANGIOGRAM N/A 12/08/2013   Procedure: LEFT AND RIGHT HEART CATHETERIZATION WITH CORONARY ANGIOGRAM;  Surgeon: Sanda Klein, MD;  Location: [place] CATH LAB;  Service: Cardiovascular;  Laterality: N/A;  . REFRACTIVE SURGERY Bilateral   . REPLACEMENT TOTAL KNEE Right   . TONSILLECTOMY     70 years ago  . TOTAL KNEE ARTHROPLASTY  Right 04/28/2014   Procedure: TOTAL KNEE ARTHROPLASTY;  Surgeon: Kathryne Hitch, MD;  Location: [place];  Service: Orthopedics;  Laterality: Right;  . TRANSCATHETER AORTIC VALVE REPLACEMENT, TRANSFEMORAL N/A 01/19/2014   Procedure: TRANSCATHETER AORTIC VALVE REPLACEMENT, TRANSFEMORAL;  Surgeon: Sherren Mocha, MD;  Location: [place];  Service: Open Heart Surgery;  Laterality: N/A;   Family History  Problem Relation Age of Onset  . Muscular dystrophy Father        Died at 42  . Stroke Mother   . Ovarian cancer Sister   . Breast cancer Sister   . Heart attack Sister        Died at 47  . Colon cancer Neg Hx   . Hypertension Neg Hx    Social History   Socioeconomic History  . Marital status: Widowed    Spouse name: Not on file  . Number of children: 3  . Years of education: Not on file  . Highest education level: Not on file  Occupational History  . Not on file  Tobacco Use  . Smoking status: Never Smoker  . Smokeless tobacco: Never Used  Substance and Sexual Activity  . Alcohol use: No  . Drug use: No  . Sexual activity: Never  Other Topics Concern  . Not on file  Social History Narrative   Diet?       Do you drink/eat things with caffeine? No      Marital status?         widow                           What year were you married?      Do you live in a house, apartment, assisted living, condo, trailer, etc.?  Condo       Is it one or more stories? one      How many persons live in your home? Self      Do you have any pets in your home? (please list) no      Current or past profession: office      Do you exercise?                no                      Type & how often?      Do you have a living will? yes      Do you have a DNR form?  yes                                If not, do you want to discuss one?      Do you have signed POA/HPOA for forms? yes   Social Determinants of Health   Financial Resource Strain:   . Difficulty of Paying Living Expenses:   Food  Insecurity:   . Worried About Charity fundraiser in the Last Year:   . [employer]  in the Last Year:   Transportation Needs:   . Film/video editor (Medical):   Marland Kitchen Lack of Transportation (Non-Medical):   Physical Activity:   . Days of Exercise per Week:   . Minutes of Exercise per Session:   Stress:   . Feeling of Stress :   Social Connections:   . Frequency of Communication with Friends and Family:   . Frequency of Social Gatherings with Friends and Family:   . Attends Religious Services:   . Active Member of Clubs or Organizations:   . Attends Archivist Meetings:   Marland Kitchen Marital Status:     Outpatient Encounter Medications as of 05/01/2019  Medication Sig  . amLODipine (NORVASC) 10 MG tablet Take 1 tablet (10 mg total) by mouth daily.  Marland Kitchen aspirin EC 81 MG tablet Take 1 tablet (81 mg total) by mouth daily.  . carvedilol (COREG) 12.5 MG tablet Take 1 tablet by mouth twice daily  . denosumab (PROLIA) 60 MG/ML SOSY injection Inject 60 mg into the skin every 6 (six) months.  . fluticasone (FLONASE) 50 MCG/ACT nasal spray Use one spray in each nostril twice daily as needed for congestion  . ibuprofen (ADVIL) 200 MG tablet Take 200 mg by mouth every 6 (six) hours as needed for mild pain or moderate pain.  Marland Kitchen metoCLOPramide (REGLAN) 5 MG tablet Take 5 mg by mouth 2 (two) times daily.  . mirabegron ER (MYRBETRIQ) 25 MG TB24 tablet Take 1 tablet (25 mg total) by mouth daily.  . mirtazapine (REMERON) 15 MG tablet Take 15 mg by mouth at bedtime.  . Multiple Vitamins-Minerals (ICAPS AREDS 2) CAPS Take 2 capsules by mouth daily.  . ondansetron (ZOFRAN) 4 MG tablet Take 1 tablet (4 mg total) by mouth every 8 (eight) hours as needed for nausea.  . pantoprazole (PROTONIX) 40 MG tablet Take 1 tablet (40 mg total) by mouth 2 (two) times daily.   No facility-administered encounter medications on file as of 05/01/2019.    Activities of Daily Living In your present state of health, do  you have any difficulty performing the following activities: 05/01/2019  Hearing? Y  Comment hearing impaired, hearing aides  Vision? N  Difficulty concentrating or making decisions? Y  Walking or climbing stairs? Y  Comment uses cane/walker for assistance, no stairs in home  Dressing or bathing? N  Doing errands, shopping? Y  Comment daughter helps  Conservation officer, nature and eating ? N  Comment daugther prepares food  Using the Toilet? N  In the past six months, have you accidently leaked urine? Y  Do you have problems with loss of bowel control? N  Managing your Medications? Y  Comment daughter helps  Managing your Finances? Y  Comment daughter helps  Housekeeping or managing your Housekeeping? Y  Comment daughter helps  Some recent data might be hidden    Patient Care Team: Lauree Chandler, NP as PCP - General (Geriatric Medicine) Milus Banister, MD as Attending Physician (Gastroenterology) Alphonsa Overall, MD as Consulting Physician (General Surgery) Sherren Mocha, MD as Consulting Physician (Cardiology) Suella Broad, MD as Consulting Physician (Physical Medicine and Rehabilitation) Dalton-Bethea, Fabio Asa, MD as Consulting Physician (Physical Medicine and Rehabilitation) Thalia Bloodgood, [place] as Referring Physician (Optometry)    Assessment:   This is a routine wellness examination for [place].  Exercise Activities and Dietary recommendations Current Exercise Habits: The patient does not participate in regular exercise at present, Exercise limited by: orthopedic condition(s)  Goals   None  Fall Risk Fall Risk  05/01/2019 04/27/2019 04/03/2019 02/03/2019 11/19/2018  Falls in the past year? 1 0 0 0 0  Number falls in past yr: 0 0 0 0 0  Injury with Fall? 0 - 0 0 -   Is the patient's home free of loose throw rugs in walkways, pet beds, electrical cords, etc?   yes      Grab bars in the bathroom? yes      Handrails on the stairs?   no stairs      Adequate lighting?    yes  Timed Get Up and Go performed: na  Depression Screen PHQ 2/9 Scores 03/19/2018 02/14/2018 04/17/2017 03/09/2016  PHQ - 2 Score 0 0 0 0     Cognitive Function MMSE - Mini Mental State Exam 03/19/2018 12/06/2015  Orientation to time 5 4  Orientation to Place 5 4  Registration 3 3  Attention/ Calculation 5 5  Recall 1 3  Language- name 2 objects 2 2  Language- repeat 1 1  Language- follow 3 step command 3 3  Language- read & follow direction 1 1  Write a sentence 1 1  Copy design 1 1  Total score 28 28     6CIT Screen 05/01/2019  What Year? 0 points  What month? 0 points  What time? 0 points  Count back from 20 0 points  Months in reverse 0 points  Repeat phrase 2 points  Total Score 2    Immunization History  Administered Date(s) Administered  . Influenza, High Dose Seasonal PF 11/26/2016, 11/07/2017, 11/03/2018  . Influenza-Unspecified 11/05/2013, 10/21/2014, 10/27/2015  . PFIZER SARS-COV-2 Vaccination 03/14/2019, 04/04/2019  . Pneumococcal Conjugate-13 12/18/2016  . Pneumococcal Polysaccharide-23 11/01/2012, 12/06/2015    Qualifies for Shingles Vaccine?yes, had shingrix, need records (unsure what pharmacy)   Screening Tests Health Maintenance  Topic Date Due  . TETANUS/TDAP  03/20/2023 (Originally 04/09/1944)  . MAMMOGRAM  06/19/2023 (Originally 01/15/2017)  . INFLUENZA VACCINE  Completed  . DEXA SCAN  Completed  . PNA vac Low Risk Adult  Completed    Cancer Screenings: Lung: Low Dose CT Chest recommended if Age 69-80 years, 30 pack-year currently smoking OR have quit w/in 15years. Patient does not qualify. Breast:  Up to date on Mammogram? Aged out Up to date of Bone Density/Dexa? No but scheduled Colorectal: aged out  Additional Screenings:  Hepatitis C Screening: na     Plan:      I have personally reviewed and noted the following in the patient's chart:   . Medical and social history . Use of alcohol, tobacco or illicit drugs  . Current  medications and supplements . Functional ability and status . Nutritional status . Physical activity . Advanced directives . List of other physicians . Hospitalizations, surgeries, and ER visits in previous 12 months . Vitals . Screenings to include cognitive, depression, and falls . Referrals and appointments  In addition, I have reviewed and discussed with patient certain preventive protocols, quality metrics, and best practice recommendations. A written personalized care plan for preventive services as well as general preventive health recommendations were provided to patient.     Lauree Chandler, NP  05/01/2019

## 2019-05-01 NOTE — Progress Notes (Signed)
   This service is provided via telemedicine  No vital signs collected/recorded due to the encounter was a telemedicine visit.   Location of patient (ex: home, work):  Home   Patient consents to a telephone visit:  Yes  Location of the provider (ex: office, home):  [facility], Office   Name of any referring provider:  N/A  Names of all persons participating in the telemedicine service and their role in the encounter:  S.Chrae B/CMA, Sherrie Mustache, NP, [name] (daughter), and Patient   Time spent on call:  9 min with medical assistant

## 2019-05-01 NOTE — Telephone Encounter (Signed)
I called patient and her daughter daughter to complete  AWV scheduled for today via phone call and patients daughter [name] asked what was the status of patients MRI being scheduled.  I informed [name] that I will send a message to [facility] (referral coordinator) to check the status.    Lattie Haw, please call [name] back with an update

## 2019-05-01 NOTE — Telephone Encounter (Signed)
Per Sheri Mustache, NP, cancel order for MRI. After [name] spoke with patient and her daughter it was decided that due to age and comorbidties patient would not wish to do any aggressive treatment if MRI revealed anything.  Order canceled

## 2019-05-01 NOTE — Progress Notes (Signed)
This service is provided via telemedicine  No vital signs collected/recorded due to the encounter was a telemedicine visit.   Location of patient (ex: home, work):  Home   Patient consents to a telephone visit:  Yes   Location of the provider (ex: office, home):  [facility], Office   Name of any referring provider:  N/A  Names of all persons participating in the telemedicine service and their role in the encounter:  S.Chrae B/CMA, Sherrie Mustache, NP, [name] (daughter) and Patient   Time spent on call:  9 min with medical assistant       Careteam: Patient Care Team: Lauree Chandler, NP as PCP - General (Geriatric Medicine) Milus Banister, MD as Attending Physician (Gastroenterology) Alphonsa Overall, MD as Consulting Physician (General Surgery) Sherren Mocha, MD as Consulting Physician (Cardiology) Suella Broad, MD as Consulting Physician (Physical Medicine and Rehabilitation) Dalton-Bethea, Fabio Asa, MD as Consulting Physician (Physical Medicine and Rehabilitation) Thalia Bloodgood, OD as Referring Physician (Optometry)  PLACE OF SERVICE:  [place]  Advanced Directive information    Allergies  Allergen Reactions  . Statins     pain  . Codeine Nausea And Vomiting  . Phenytoin Nausea And Vomiting    Chief Complaint  Patient presents with  . Advanced Directive    Advance care planning for MOST form   . Orders     HPI: Patient is a 86 y.o. female for most form completion and follow up TIA. Video visit attempted several times yet was not successful therefore telephone visit done.  She was seen in office by Dr Mariea Clonts on 04/27/19 due to episodes of slurred speech, CT brain done and CVA noted as well and MRI has been order for further evaluation. She was started on ASA EC 81 mg daily, tolerating this without issues.  She was previously on statin but taken off due to pill burden after she was removed from statin she felt so much better, daughter reports  (even after CVA noted) she would not want to restart statin due to quality of life. Also questions MRI    Review of Systems:  Review of Systems  Constitutional: Negative for chills, fever and weight loss.  Respiratory: Negative for shortness of breath.   Cardiovascular: Negative for chest pain.  Gastrointestinal: Negative for abdominal pain (improved on reglan), constipation and diarrhea.  Musculoskeletal: Positive for back pain. Negative for myalgias (improved after stopping statin).  Neurological: Negative for dizziness, focal weakness and headaches.  Psychiatric/Behavioral: Positive for memory loss.    Past Medical History:  Diagnosis Date  . Anxiety   . Arthritis    Both knees R>L  . Central stenosis of spinal canal    moderate L4/5 with L4/5 and L5/S1 disc bulges  . Chronic diastolic congestive heart failure, NYHA class 2 ([place])   . Facet joint syndrome    Left L2-3, L3-4, L4-5, L5-S1  . GERD (gastroesophageal reflux disease) 05/14/2011  . Heart murmur   . Hyperlipidemia   . Hypertension   . Left low back pain    without sacroiliac joint componet  . Lumbago   . Lumbosacral spondylosis without myelopathy   . 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 Severe aortic stenosis 12/06/2013  . Shortness of breath dyspnea   . Spinal stenosis, lumbosacral region    Past Surgical History:  Procedure Laterality Date  . CARPAL TUNNEL  RELEASE Right 10/31/2015  . CATARACT EXTRACTION Bilateral   . CHOLECYSTECTOMY  2008  . EYE SURGERY    . INTRAOPERATIVE TRANSESOPHAGEAL ECHOCARDIOGRAM N/A 01/19/2014   Procedure: INTRAOPERATIVE TRANSESOPHAGEAL ECHOCARDIOGRAM;  Surgeon: Sherren Mocha, MD;  Location: [facility] OR;  Service: Open Heart Surgery;  Laterality: N/A;  . KNEE ARTHROSCOPY Bilateral   . LEFT AND RIGHT HEART CATHETERIZATION WITH CORONARY ANGIOGRAM N/A 12/08/2013    Procedure: LEFT AND RIGHT HEART CATHETERIZATION WITH CORONARY ANGIOGRAM;  Surgeon: Sanda Klein, MD;  Location: [place] CATH LAB;  Service: Cardiovascular;  Laterality: N/A;  . REFRACTIVE SURGERY Bilateral   . REPLACEMENT TOTAL KNEE Right   . TONSILLECTOMY     70 years ago  . TOTAL KNEE ARTHROPLASTY Right 04/28/2014   Procedure: TOTAL KNEE ARTHROPLASTY;  Surgeon: Kathryne Hitch, MD;  Location: [place];  Service: Orthopedics;  Laterality: Right;  . TRANSCATHETER AORTIC VALVE REPLACEMENT, TRANSFEMORAL N/A 01/19/2014   Procedure: TRANSCATHETER AORTIC VALVE REPLACEMENT, TRANSFEMORAL;  Surgeon: Sherren Mocha, MD;  Location: [place];  Service: Open Heart Surgery;  Laterality: N/A;   Social History:   reports that she has never smoked. She has never used smokeless tobacco. She reports that she does not drink alcohol or use drugs.  Family History  Problem Relation Age of Onset  . Muscular dystrophy Father        Died at 32  . Stroke Mother   . Ovarian cancer Sister   . Breast cancer Sister   . Heart attack Sister        Died at 66  . Colon cancer Neg Hx   . Hypertension Neg Hx     Medications: Patient's Medications  New Prescriptions   No medications on file  Previous Medications   AMLODIPINE (NORVASC) 10 MG TABLET    Take 1 tablet (10 mg total) by mouth daily.   ASPIRIN EC 81 MG TABLET    Take 1 tablet (81 mg total) by mouth daily.   CARVEDILOL (COREG) 12.5 MG TABLET    Take 1 tablet by mouth twice daily   DENOSUMAB (PROLIA) 60 MG/ML SOSY INJECTION    Inject 60 mg into the skin every 6 (six) months.   FLUTICASONE (FLONASE) 50 MCG/ACT NASAL SPRAY    Use one spray in each nostril twice daily as needed for congestion   IBUPROFEN (ADVIL) 200 MG TABLET    Take 200 mg by mouth every 6 (six) hours as needed for mild pain or moderate pain.   METOCLOPRAMIDE (REGLAN) 5 MG TABLET    Take 5 mg by mouth 2 (two) times daily.   MIRABEGRON ER (MYRBETRIQ) 25 MG TB24 TABLET    Take 1 tablet (25 mg total) by mouth  daily.   MIRTAZAPINE (REMERON) 15 MG TABLET    Take 15 mg by mouth at bedtime.   MULTIPLE VITAMINS-MINERALS (ICAPS AREDS 2) CAPS    Take 2 capsules by mouth daily.   ONDANSETRON (ZOFRAN) 4 MG TABLET    Take 1 tablet (4 mg total) by mouth every 8 (eight) hours as needed for nausea.   PANTOPRAZOLE (PROTONIX) 40 MG TABLET    Take 1 tablet (40 mg total) by mouth 2 (two) times daily.  Modified Medications   No medications on file  Discontinued Medications   No medications on file    Physical Exam:  There were no vitals filed for this visit. There is no height or weight on file to calculate BMI. Wt Readings from Last 3 Encounters:  04/27/19 131 lb (59.4  kg)  04/03/19 129 lb 12.8 oz (58.9 kg)  02/03/19 132 lb (59.9 kg)     Labs reviewed: Basic Metabolic Panel: Recent Labs    09/24/18 0954 01/31/19 1430 02/03/19 0948  NA 141 139 140  K 4.8 3.7 4.7  CL 107 103 106  CO2 25 24 24   GLUCOSE 93 101* 98  BUN 26* 20 25  CREATININE 1.52* 1.66* 1.49*  CALCIUM 9.8 8.9 9.6   Liver Function Tests: Recent Labs    09/24/18 0954 01/31/19 1430  AST 17 27  ALT 15 25  ALKPHOS  --  32*  BILITOT 0.4 0.6  PROT 7.4 7.4  ALBUMIN  --  3.7   No results for input(s): LIPASE, AMYLASE in the last 8760 hours. No results for input(s): AMMONIA in the last 8760 hours. CBC: Recent Labs    09/24/18 0954 01/31/19 1430 02/03/19 0948  WBC 6.2 6.9 5.6  NEUTROABS 3,410 3.4 2,873  HGB 11.0* 10.2* 10.9*  HCT 34.7* 33.2* 34.1*  MCV 89.2 92.0 88.8  PLT 334 397 401*   Lipid Panel: Recent Labs    09/24/18 0954  CHOL 192  HDL 54  LDLCALC 116*  TRIG 111  CHOLHDL 3.6   TSH: No results for input(s): TSH in the last 8760 hours. A1C: Lab Results  Component Value Date   HGBA1C 5.4 01/15/2014     Assessment/Plan 1. Recent cerebrovascular accident (CVA) without late effect CT confirmed CVA, MRI recommended however daughter has decided not to get MRI. She said it would not change what they do  moving forward due to her age. She does not wish to restart a statin (when she stopped it she realized she felt a lot better) due to quality of life. She has started the ASA 81 mg and tolerating well.   2. Advanced care planning/counseling discussion -MOST form completed and answered questions.   Next appt: 6 month follow up [place]. Harle Battiest  [facility] [phone]    Virtual Visit via Telephone Note  I connected with@ on 05/01/19 at  9:30 AM EST by telephone and verified that I am speaking with the correct person using two identifiers.  Location: Patient: home Provider: office   I discussed the limitations, risks, security and privacy concerns of performing an evaluation and management service by telephone and the availability of in person appointments. I also discussed with the patient that there may be a patient responsible charge related to this service. The patient expressed understanding and agreed to proceed.   I discussed the assessment and treatment plan with the patient. The patient was provided an opportunity to ask questions and all were answered. The patient agreed with the plan and demonstrated an understanding of the instructions.   The patient was advised to call back or seek an in-person evaluation if the symptoms worsen or if the condition fails to improve as anticipated.  I provided 22 minutes of non-face-to-face time during this encounter.  Carlos American. Harle Battiest Avs printed and mailed

## 2019-06-08 ENCOUNTER — Other Ambulatory Visit: Payer: Self-pay | Admitting: *Deleted

## 2019-06-08 MED ORDER — MIRTAZAPINE 15 MG PO TABS: 15.0000 mg | ORAL_TABLET | Freq: Every day | ORAL | 0 refills | Status: DC

## 2019-06-10 ENCOUNTER — Other Ambulatory Visit: Payer: Self-pay | Admitting: *Deleted

## 2019-06-10 MED ORDER — FLUTICASONE PROPIONATE 50 MCG/ACT NA SUSP: NASAL | 3 refills | Status: AC

## 2019-06-10 NOTE — Telephone Encounter (Signed)
Patient daughter, [name] requested refill Requested to be sent to [facility].

## 2019-06-30 DIAGNOSIS — H903 Sensorineural hearing loss, bilateral: Secondary | ICD-10-CM | POA: Diagnosis not present

## 2019-07-08 ENCOUNTER — Other Ambulatory Visit: Payer: Self-pay

## 2019-07-08 ENCOUNTER — Ambulatory Visit
Admission: RE | Admit: 2019-07-08 | Discharge: 2019-07-08 | Disposition: A | Discharge status: Home / Self Care | Payer: Medicare Other | Source: Ambulatory Visit | Attending: Nurse Practitioner | Admitting: Nurse Practitioner

## 2019-07-08 DIAGNOSIS — E2839 Other primary ovarian failure: Secondary | ICD-10-CM

## 2019-07-08 DIAGNOSIS — M810 Age-related osteoporosis without current pathological fracture: Secondary | ICD-10-CM | POA: Diagnosis not present

## 2019-07-08 DIAGNOSIS — Z780 Asymptomatic menopausal state: Secondary | ICD-10-CM | POA: Diagnosis not present

## 2019-08-03 ENCOUNTER — Other Ambulatory Visit: Payer: Self-pay | Admitting: Nurse Practitioner

## 2019-08-21 ENCOUNTER — Other Ambulatory Visit: Payer: Self-pay | Admitting: Family

## 2019-08-21 DIAGNOSIS — K219 Gastro-esophageal reflux disease without esophagitis: Secondary | ICD-10-CM

## 2019-09-09 DIAGNOSIS — M25561 Pain in right knee: Secondary | ICD-10-CM | POA: Diagnosis not present

## 2019-09-12 ENCOUNTER — Other Ambulatory Visit: Payer: Self-pay | Admitting: Nurse Practitioner

## 2019-09-21 ENCOUNTER — Other Ambulatory Visit: Payer: Self-pay | Admitting: Nurse Practitioner

## 2019-09-23 ENCOUNTER — Other Ambulatory Visit: Payer: Self-pay | Admitting: *Deleted

## 2019-09-23 MED ORDER — DENOSUMAB 60 MG/ML ~~LOC~~ SOSY: 60.0000 mg | PREFILLED_SYRINGE | SUBCUTANEOUS | 0 refills | Status: AC

## 2019-09-23 NOTE — Telephone Encounter (Signed)
[name], daughter called and requested refill to be sent to Pharmacy. Stated that patient has an appointment on 8/13 with Janett Billow

## 2019-10-02 ENCOUNTER — Ambulatory Visit (INDEPENDENT_AMBULATORY_CARE_PROVIDER_SITE_OTHER): Payer: Medicare Other | Admitting: Nurse Practitioner

## 2019-10-02 ENCOUNTER — Encounter: Payer: Self-pay | Admitting: Nurse Practitioner

## 2019-10-02 ENCOUNTER — Other Ambulatory Visit: Payer: Self-pay

## 2019-10-02 VITALS — BP 132/80 | HR 71 | Temp 97.1°F | Ht 60.0 in | Wt 138.0 lb

## 2019-10-02 DIAGNOSIS — I10 Essential (primary) hypertension: Secondary | ICD-10-CM

## 2019-10-02 DIAGNOSIS — R413 Other amnesia: Secondary | ICD-10-CM | POA: Diagnosis not present

## 2019-10-02 DIAGNOSIS — M810 Age-related osteoporosis without current pathological fracture: Secondary | ICD-10-CM | POA: Diagnosis not present

## 2019-10-02 DIAGNOSIS — I5032 Chronic diastolic (congestive) heart failure: Secondary | ICD-10-CM | POA: Diagnosis not present

## 2019-10-02 DIAGNOSIS — I350 Nonrheumatic aortic (valve) stenosis: Secondary | ICD-10-CM

## 2019-10-02 DIAGNOSIS — K219 Gastro-esophageal reflux disease without esophagitis: Secondary | ICD-10-CM

## 2019-10-02 DIAGNOSIS — M4807 Spinal stenosis, lumbosacral region: Secondary | ICD-10-CM

## 2019-10-02 MED ORDER — DENOSUMAB 60 MG/ML ~~LOC~~ SOSY
60.0000 mg | PREFILLED_SYRINGE | Freq: Once | SUBCUTANEOUS | Status: AC
  Administered 2019-10-02: 60 mg via SUBCUTANEOUS

## 2019-10-02 NOTE — Progress Notes (Signed)
Careteam: Patient Care Team: Lauree Chandler, NP as PCP - General (Geriatric Medicine) Milus Banister, MD as Attending Physician (Gastroenterology) Alphonsa Overall, MD as Consulting Physician (General Surgery) Sherren Mocha, MD as Consulting Physician (Cardiology) Suella Broad, MD as Consulting Physician (Physical Medicine and Rehabilitation) Dalton-Bethea, Fabio Asa, MD as Consulting Physician (Physical Medicine and Rehabilitation) Thalia Bloodgood, Gorman as Referring Physician (Optometry)  PLACE OF SERVICE:  [place] Directive information Does Patient Have a Medical Advance Directive?: Yes, Type of Advance Directive: Healthcare Power of [place];Living will;Out of facility DNR (pink MOST or yellow form), Pre-existing out of facility DNR order (yellow form or pink MOST form): Yellow form placed in chart (order not valid for inpatient use);Pink MOST form placed in chart (order not valid for inpatient use), Does patient want to make changes to medical advance directive?: No - Patient declined  Allergies  Allergen Reactions  . Statins     pain  . Codeine Nausea And Vomiting  . Phenytoin Nausea And Vomiting    Chief Complaint  Patient presents with  . Medical Management of Chronic Issues    6 month follow-up and prolia injection. Here with daughter [name]   . Immunizations    Flu vaccine not in stock      HPI: Patient is a 86 y.o. female for routine follow up  Weight up- eating good, daughter bringing her nutritious meals  Got a new hearing aide- able to have a conversation.   Memory loss- progressive decline, had CT in March which showed atrophy. 6CIT 2 in March, daughter feels like it has progressed since then.   GERD- well controlled.   Chronic back pain- ongoing, went out shopping and then had bad pain.   Osteoporosis- getting prolia with cal and vit d  Review of Systems:  Review of Systems  Constitutional: Negative for chills, fever and weight  loss.  HENT: Negative for hearing loss.   Respiratory: Negative for cough, sputum production and shortness of breath.   Cardiovascular: Negative for chest pain, palpitations and leg swelling.  Gastrointestinal: Negative for abdominal pain, constipation, diarrhea and heartburn.  Genitourinary: Negative for dysuria, frequency and urgency.  Musculoskeletal: Positive for back pain. Negative for falls, joint pain and myalgias.  Skin: Negative.   Neurological: Negative for dizziness and headaches.  Psychiatric/Behavioral: Positive for memory loss. Negative for depression. The patient does not have insomnia.     Past Medical History:  Diagnosis Date  . Anxiety   . Arthritis    Both knees R>L  . Central stenosis of spinal canal    moderate L4/5 with L4/5 and L5/S1 disc bulges  . Chronic diastolic congestive heart failure, NYHA class 2 ([place])   . Facet joint syndrome    Left L2-3, L3-4, L4-5, L5-S1  . GERD (gastroesophageal reflux disease) 05/14/2011  . Heart murmur   . Hyperlipidemia   . Hypertension   . Left low back pain    without sacroiliac joint componet  . Lumbago   . Lumbosacral spondylosis without myelopathy   . 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 Severe aortic stenosis 12/06/2013  . Shortness of breath dyspnea   . Spinal stenosis, lumbosacral region    Past Surgical History:  Procedure Laterality Date  . CARPAL TUNNEL RELEASE Right 10/31/2015  . CATARACT EXTRACTION Bilateral   . CHOLECYSTECTOMY  2008  . EYE SURGERY    .  INTRAOPERATIVE TRANSESOPHAGEAL ECHOCARDIOGRAM N/A 01/19/2014   Procedure: INTRAOPERATIVE TRANSESOPHAGEAL ECHOCARDIOGRAM;  Surgeon: Sherren Mocha, MD;  Location: [facility] OR;  Service: Open Heart Surgery;  Laterality: N/A;  . KNEE ARTHROSCOPY Bilateral   . LEFT AND RIGHT HEART CATHETERIZATION WITH CORONARY ANGIOGRAM N/A 12/08/2013   Procedure:  LEFT AND RIGHT HEART CATHETERIZATION WITH CORONARY ANGIOGRAM;  Surgeon: Sanda Klein, MD;  Location: [facility] CATH LAB;  Service: Cardiovascular;  Laterality: N/A;  . REFRACTIVE SURGERY Bilateral   . REPLACEMENT TOTAL KNEE Right   . TONSILLECTOMY     70 years ago  . TOTAL KNEE ARTHROPLASTY Right 04/28/2014   Procedure: TOTAL KNEE ARTHROPLASTY;  Surgeon: Kathryne Hitch, MD;  Location: [facility];  Service: Orthopedics;  Laterality: Right;  . TRANSCATHETER AORTIC VALVE REPLACEMENT, TRANSFEMORAL N/A 01/19/2014   Procedure: TRANSCATHETER AORTIC VALVE REPLACEMENT, TRANSFEMORAL;  Surgeon: Sherren Mocha, MD;  Location: [place] [facility];  Service: Open Heart Surgery;  Laterality: N/A;   Social History:   reports that she has never smoked. She has never used smokeless tobacco. She reports that she does not drink alcohol and does not use drugs.  Family History  Problem Relation Age of Onset  . Muscular dystrophy Father        Died at 34  . Stroke Mother   . Ovarian cancer Sister   . Breast cancer Sister   . Heart attack Sister        Died at 50  . Colon cancer Neg Hx   . Hypertension Neg Hx     Medications: Patient's Medications  New Prescriptions   No medications on file  Previous Medications   AMLODIPINE (NORVASC) 10 MG TABLET    Take 1 tablet by mouth once daily   ASPIRIN EC 81 MG TABLET    Take 1 tablet (81 mg total) by mouth daily.   CARVEDILOL (COREG) 12.5 MG TABLET    Take 1 tablet by mouth twice daily   DENOSUMAB (PROLIA) 60 MG/ML SOSY INJECTION    Inject 60 mg into the skin every 6 (six) months.   FLUTICASONE (FLONASE) 50 MCG/ACT NASAL SPRAY    Use one spray in each nostril twice daily as needed for congestion   IBUPROFEN (ADVIL) 200 MG TABLET    Take 200 mg by mouth every 6 (six) hours as needed for mild pain or moderate pain.   METOCLOPRAMIDE (REGLAN) 5 MG TABLET    Take 5 mg by mouth 2 (two) times daily.   MIRABEGRON ER (MYRBETRIQ) 25 MG TB24 TABLET    Take 1 tablet (25 mg total) by mouth daily.    MIRTAZAPINE (REMERON) 15 MG TABLET    TAKE 1 TABLET BY MOUTH EVERY DAY AT BEDTIME   MULTIPLE VITAMINS-MINERALS (ICAPS AREDS 2) CAPS    Take 2 capsules by mouth daily.   ONDANSETRON (ZOFRAN) 4 MG TABLET    Take 1 tablet (4 mg total) by mouth every 8 (eight) hours as needed for nausea.   PANTOPRAZOLE (PROTONIX) 40 MG TABLET    Take 1 tablet by mouth twice daily  Modified Medications   No medications on file  Discontinued Medications   No medications on file    Physical Exam:  Vitals:   10/02/19 0817  BP: 132/80  Pulse: 71  Temp: (!) 97.1 F (36.2 C)  TempSrc: Temporal  SpO2: 95%  Weight: 138 lb (62.6 kg)  Height: 5' (1.524 m)   Body mass index is 26.95 kg/m. Wt Readings from Last 3 Encounters:  10/02/19 138 lb (62.6 kg)  04/27/19 131 lb (59.4 kg)  04/03/19 129 lb 12.8 oz (58.9 kg)    Physical Exam Constitutional:      General: She is not in acute distress.    Appearance: She is well-developed. She is not diaphoretic.  HENT:     Head: Normocephalic and atraumatic.     Mouth/Throat:     Pharynx: No oropharyngeal exudate.  Eyes:     Conjunctiva/sclera: Conjunctivae normal.     Pupils: Pupils are equal, round, and reactive to light.  Cardiovascular:     Rate and Rhythm: Normal rate and regular rhythm.     Heart sounds: Murmur heard.   Pulmonary:     Effort: Pulmonary effort is normal.     Breath sounds: Normal breath sounds.  Abdominal:     General: Bowel sounds are normal.     Palpations: Abdomen is soft.  Musculoskeletal:        General: No tenderness.     Cervical back: Normal range of motion and neck supple.  Skin:    General: Skin is warm and dry.  Neurological:     Mental Status: She is alert and oriented to person, place, and time.     Labs reviewed: Basic Metabolic Panel: Recent Labs    01/31/19 1430 02/03/19 0948  NA 139 140  K 3.7 4.7  CL 103 106  CO2 24 24  GLUCOSE 101* 98  BUN 20 25  CREATININE 1.66* 1.49*  CALCIUM 8.9 9.6   Liver  Function Tests: Recent Labs    01/31/19 1430  AST 27  ALT 25  ALKPHOS 32*  BILITOT 0.6  PROT 7.4  ALBUMIN 3.7   No results for input(s): LIPASE, AMYLASE in the last 8760 hours. No results for input(s): AMMONIA in the last 8760 hours. CBC: Recent Labs    01/31/19 1430 02/03/19 0948  WBC 6.9 5.6  NEUTROABS 3.4 2,873  HGB 10.2* 10.9*  HCT 33.2* 34.1*  MCV 92.0 88.8  PLT 397 401*   Lipid Panel: No results for input(s): CHOL, HDL, LDLCALC, TRIG, CHOLHDL, LDLDIRECT in the last 8760 hours. TSH: No results for input(s): TSH in the last 8760 hours. A1C: Lab Results  Component Value Date   HGBA1C 5.4 01/15/2014     Assessment/Plan 1. Osteoporosis without current pathological fracture, unspecified osteoporosis type - denosumab (PROLIA) injection 60 mg -continues on cal and vit D with weight bearing activity.   2. Essential hypertension -controlled on norvasc 10 mg daily with coreg 12.5 BID - CMP with eGFR([facility]) - CBC with Differential/Platelet  3. Lumbosacral stenosis Ongoing, some days worse than others.   4. Chronic diastolic heart failure (HCC) Stable, euvolemic at this time. Continues on coreg BID   5. Memory loss -progressively getting worse, CT head showed atrophy, discussed medication such as Aricept and namenda to help slow progression but she is not interested.   6. Nonrheumatic aortic valve stenosis Stable, without chest pains or shortness of breath   7. Gastroesophageal reflux disease, unspecified whether esophagitis present Stable on protonix, no current stomach issues.   Next appt: 6 months, pt and daughter opt to get lab work at next appt.  Carlos American. [place], [facility] [phone]

## 2019-11-06 ENCOUNTER — Other Ambulatory Visit: Payer: Self-pay | Admitting: Nurse Practitioner

## 2019-11-10 ENCOUNTER — Other Ambulatory Visit: Payer: Self-pay | Admitting: *Deleted

## 2019-11-10 DIAGNOSIS — K219 Gastro-esophageal reflux disease without esophagitis: Secondary | ICD-10-CM

## 2019-11-10 MED ORDER — PANTOPRAZOLE SODIUM 40 MG PO TBEC: 40.0000 mg | DELAYED_RELEASE_TABLET | Freq: Two times a day (BID) | ORAL | 1 refills | Status: AC

## 2019-11-10 NOTE — Telephone Encounter (Signed)
Patient daughter, [name] called and stated that patient takes Pantoprazole twice daily and her Rx was sent for only #30. Daughter is requesting a 90 day supply.   Current medication list shows twice daily. Rx sent to pharmacy.

## 2019-11-13 ENCOUNTER — Ambulatory Visit (INDEPENDENT_AMBULATORY_CARE_PROVIDER_SITE_OTHER): Payer: Medicare Other | Admitting: Nurse Practitioner

## 2019-11-13 ENCOUNTER — Other Ambulatory Visit: Payer: Self-pay

## 2019-11-13 ENCOUNTER — Encounter: Payer: Self-pay | Admitting: Nurse Practitioner

## 2019-11-13 VITALS — BP 150/80 | HR 72 | Temp 97.2°F | Resp 20 | Ht 60.0 in | Wt 138.2 lb

## 2019-11-13 DIAGNOSIS — M4807 Spinal stenosis, lumbosacral region: Secondary | ICD-10-CM

## 2019-11-13 DIAGNOSIS — R238 Other skin changes: Secondary | ICD-10-CM | POA: Diagnosis not present

## 2019-11-13 DIAGNOSIS — M5136 Other intervertebral disc degeneration, lumbar region: Secondary | ICD-10-CM

## 2019-11-13 DIAGNOSIS — R413 Other amnesia: Secondary | ICD-10-CM | POA: Diagnosis not present

## 2019-11-13 DIAGNOSIS — R233 Spontaneous ecchymoses: Secondary | ICD-10-CM

## 2019-11-13 DIAGNOSIS — Z23 Encounter for immunization: Secondary | ICD-10-CM | POA: Diagnosis not present

## 2019-11-13 LAB — CBC WITH DIFFERENTIAL/PLATELET
Absolute Monocytes: 891 cells/uL (ref 200–950)
Basophils Absolute: 41 cells/uL (ref 0–200)
Basophils Relative: 0.5 %
Eosinophils Absolute: 211 cells/uL (ref 15–500)
Eosinophils Relative: 2.6 %
HCT: 33.9 % — ABNORMAL LOW (ref 35.0–45.0)
Hemoglobin: 10.8 g/dL — ABNORMAL LOW (ref 11.7–15.5)
Lymphs Abs: 2098 cells/uL (ref 850–3900)
MCH: 28.8 pg (ref 27.0–33.0)
MCHC: 31.9 g/dL — ABNORMAL LOW (ref 32.0–36.0)
MCV: 90.4 fL (ref 80.0–100.0)
MPV: 11 fL (ref 7.5–12.5)
Monocytes Relative: 11 %
Neutro Abs: 4860 cells/uL (ref 1500–7800)
Neutrophils Relative %: 60 %
Platelets: 387 10*3/uL (ref 140–400)
RBC: 3.75 10*6/uL — ABNORMAL LOW (ref 3.80–5.10)
RDW: 12.7 % (ref 11.0–15.0)
Total Lymphocyte: 25.9 %
WBC: 8.1 10*3/uL (ref 3.8–10.8)

## 2019-11-13 MED ORDER — PREDNISONE 10 MG (21) PO TBPK: ORAL_TABLET | ORAL | 0 refills | Status: DC

## 2019-11-13 NOTE — Progress Notes (Signed)
Careteam: Patient Care Team: Lauree Chandler, NP as PCP - General (Geriatric Medicine) Milus Banister, MD as Attending Physician (Gastroenterology) Alphonsa Overall, MD as Consulting Physician (General Surgery) Sherren Mocha, MD as Consulting Physician (Cardiology) Suella Broad, MD as Consulting Physician (Physical Medicine and Rehabilitation) Dalton-Bethea, Fabio Asa, MD as Consulting Physician (Physical Medicine and Rehabilitation) Thalia Bloodgood, OD as Referring Physician (Optometry)  PLACE OF SERVICE:  [place]  Advanced Directive information    Allergies  Allergen Reactions   Statins     pain   Codeine Nausea And Vomiting   Phenytoin Nausea And Vomiting    Chief Complaint  Patient presents with   Acute Visit    Back Pain got worse since a week and a half ago     HPI: Patient is a 86 y.o. female due to worsening back pain.   She had a "catch in her back" 1.5 weeks ago. She got up and had increase in pain.  Daughter states she is in extreme pain.   Daughter reports worsening of bruising. She has been hitting her walker with her legs causing bruising and left arm with new bruise- reports she hit it on the wall. She denies any falls.   She had TIA in March with CVA noted on CT, she was started on ASA   Review of Systems:  Review of Systems  Constitutional: Negative for chills, fever and weight loss.  Respiratory: Negative for cough, sputum production and shortness of breath.   Cardiovascular: Negative for chest pain, palpitations and leg swelling.  Gastrointestinal: Negative for abdominal pain, constipation, diarrhea and heartburn.  Genitourinary: Negative for dysuria, frequency and urgency.  Musculoskeletal: Positive for back pain, joint pain and myalgias. Negative for falls.  Skin: Negative.   Neurological: Negative for dizziness and headaches.  Psychiatric/Behavioral: Positive for memory loss. Negative for depression. The patient does not have  insomnia.     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   23 mm Edwards Sapien 3 transcatheter heart valve placed via open right transfemoral approach   Severe aortic stenosis 12/06/2013   Shortness of breath dyspnea    Spinal stenosis, lumbosacral region    Past Surgical History:  Procedure Laterality Date   CARPAL TUNNEL RELEASE Right 10/31/2015   CATARACT EXTRACTION Bilateral    CHOLECYSTECTOMY  2008   EYE SURGERY     INTRAOPERATIVE TRANSESOPHAGEAL ECHOCARDIOGRAM N/A 01/19/2014   Procedure: INTRAOPERATIVE TRANSESOPHAGEAL ECHOCARDIOGRAM;  Surgeon: Sherren Mocha, MD;  Location: [facility];  Service: Open Heart Surgery;  Laterality: N/A;   KNEE ARTHROSCOPY Bilateral    LEFT AND RIGHT HEART CATHETERIZATION WITH CORONARY ANGIOGRAM N/A 12/08/2013   Procedure: LEFT AND RIGHT HEART CATHETERIZATION WITH CORONARY ANGIOGRAM;  Surgeon: Sanda Klein, MD;  Location: [facility] CATH LAB;  Service: Cardiovascular;  Laterality: N/A;   REFRACTIVE SURGERY Bilateral    REPLACEMENT TOTAL KNEE  Right    TONSILLECTOMY     70 years ago   TOTAL KNEE ARTHROPLASTY Right 04/28/2014   Procedure: TOTAL KNEE ARTHROPLASTY;  Surgeon: Kathryne Hitch, MD;  Location: [facility];  Service: Orthopedics;  Laterality: Right;   TRANSCATHETER AORTIC VALVE REPLACEMENT, TRANSFEMORAL N/A 01/19/2014   Procedure: TRANSCATHETER AORTIC VALVE REPLACEMENT, TRANSFEMORAL;  Surgeon: Sherren Mocha, MD;   Location: [facility];  Service: Open Heart Surgery;  Laterality: N/A;   Social History:   reports that she has never smoked. She has never used smokeless tobacco. She reports that she does not drink alcohol and does not use drugs.  Family History  Problem Relation Age of Onset   Muscular dystrophy Father        Died at 25   Stroke Mother    Ovarian cancer Sister    Breast cancer Sister    Heart attack Sister        Died at 34   Colon cancer Neg Hx    Hypertension Neg Hx     Medications: Patient's Medications  New Prescriptions   No medications on file  Previous Medications   AMLODIPINE (NORVASC) 10 MG TABLET    Take 1 tablet by mouth once daily   ASPIRIN EC 81 MG TABLET    Take 1 tablet (81 mg total) by mouth daily.   CARVEDILOL (COREG) 12.5 MG TABLET    Take 1 tablet by mouth twice daily   DENOSUMAB (PROLIA) 60 MG/ML SOSY INJECTION    Inject 60 mg into the skin every 6 (six) months.   FLUTICASONE (FLONASE) 50 MCG/ACT NASAL SPRAY    Use one spray in each nostril twice daily as needed for congestion   IBUPROFEN (ADVIL) 200 MG TABLET    Take 200 mg by mouth every 6 (six) hours as needed for mild pain or moderate pain.   METOCLOPRAMIDE (REGLAN) 5 MG TABLET    Take 5 mg by mouth 2 (two) times daily.   MIRABEGRON ER (MYRBETRIQ) 25 MG TB24 TABLET    Take 1 tablet (25 mg total) by mouth daily.   MIRTAZAPINE (REMERON) 15 MG TABLET    TAKE 1 TABLET BY MOUTH EVERY DAY AT BEDTIME   MULTIPLE VITAMINS-MINERALS (ICAPS AREDS 2) CAPS    Take 2 capsules by mouth daily.   ONDANSETRON (ZOFRAN) 4 MG TABLET    Take 1 tablet (4 mg total) by mouth every 8 (eight) hours as needed for nausea.   PANTOPRAZOLE (PROTONIX) 40 MG TABLET    Take 1 tablet (40 mg total) by mouth 2 (two) times daily.  Modified Medications   No medications on file  Discontinued Medications   No medications on file    Physical Exam:  Vitals:   11/13/19 1305  BP: (!) 150/80  Pulse: 72  Resp: 20  Temp: (!) 97.2 F (36.2  C)  TempSrc: Temporal  SpO2: 94%  Weight: 138 lb 3.2 oz (62.7 kg)  Height: 5' (1.524 m)   Body mass index is 26.99 kg/m. Wt Readings from Last 3 Encounters:  11/13/19 138 lb 3.2 oz (62.7 kg)  10/02/19 138 lb (62.6 kg)  04/27/19 131 lb (59.4 kg)    Physical Exam Constitutional:      General: She is not in acute distress.    Appearance: She is well-developed. She is not diaphoretic.  HENT:     Head: Normocephalic and atraumatic.     Mouth/Throat:     Pharynx: No oropharyngeal exudate.  Eyes:     Conjunctiva/sclera:  Conjunctivae normal.     Pupils: Pupils are equal, round, and reactive to light.  Cardiovascular:     Rate and Rhythm: Normal rate and regular rhythm.     Heart sounds: Normal heart sounds.  Pulmonary:     Effort: Pulmonary effort is normal.     Breath sounds: Normal breath sounds.  Abdominal:     General: Bowel sounds are normal.     Palpations: Abdomen is soft.  Musculoskeletal:        General: No tenderness.     Cervical back: Normal range of motion and neck supple.  Skin:    General: Skin is warm and dry.  Neurological:     Mental Status: She is alert. Mental status is at baseline.     Sensory: No sensory deficit.     Motor: No weakness.     Coordination: Coordination normal.     Gait: Gait abnormal (slow gait, uses walker).     Deep Tendon Reflexes: Reflexes normal.  Psychiatric:        Mood and Affect: Mood normal.     Labs reviewed: Basic Metabolic Panel: Recent Labs    01/31/19 1430 02/03/19 0948  NA 139 140  K 3.7 4.7  CL 103 106  CO2 24 24  GLUCOSE 101* 98  BUN 20 25  CREATININE 1.66* 1.49*  CALCIUM 8.9 9.6   Liver Function Tests: Recent Labs    01/31/19 1430  AST 27  ALT 25  ALKPHOS 32*  BILITOT 0.6  PROT 7.4  ALBUMIN 3.7   No results for input(s): LIPASE, AMYLASE in the last 8760 hours. No results for input(s): AMMONIA in the last 8760 hours. CBC: Recent Labs    01/31/19 1430 02/03/19 0948  WBC 6.9 5.6    NEUTROABS 3.4 2,873  HGB 10.2* 10.9*  HCT 33.2* 34.1*  MCV 92.0 88.8  PLT 397 401*   Lipid Panel: No results for input(s): CHOL, HDL, LDLCALC, TRIG, CHOLHDL, LDLDIRECT in the last 8760 hours. TSH: No results for input(s): TSH in the last 8760 hours. A1C: Lab Results  Component Value Date   HGBA1C 5.4 01/15/2014     Assessment/Plan 1. Need for influenza vaccination - Flu Vaccine QUAD High Dose(Fluad)  2. DDD (degenerative disc disease), lumbar -acute on chronic pain, causing decrease in mobility. Decrease oral intake due to pain.  - predniSONE (STERAPRED UNI-PAK 21 TAB) 10 MG (21) TBPK tablet; Use as directed  Dispense: 21 tablet; Refill: 0 - Ambulatory referral to [facility] for evaluation and treatment.   3. Abnormal bruising Pt had TIA-like event with CVA noted on imaging, she was then started on ASA. Now bruising more easily, discussed risk vs benefits in stopping ASA and daughter would like to continue but will follow up CBC to ensure plt and hgb stable.  - CBC with Differential/Platelet  4. Lumbosacral stenosis Acute on chronic, she is having worsening pain and decrease mobility at this time. No warning signs noted.  - predniSONE (STERAPRED UNI-PAK 21 TAB) 10 MG (21) TBPK tablet; Use as directed  Dispense: 21 tablet; Refill: 0 - Ambulatory referral to Home Health  5. Memory loss -progressively worsening memory loss, had CVA noted on CT in March - Ambulatory referral to [facility] for ST    Next appt: 04/08/2020, sooner if needed  Janett Billow K. [place], [facility] [phone]

## 2019-11-13 NOTE — Patient Instructions (Signed)
Referral for home health placed  If you do not hear back about referral in a week please call our office back and ask to speak with Lattie Haw (our referral coordinator) for an update.   To get prednisone at pharmacy  We will call about the form for more help in the home and complete- we will notify you once it has been sent

## 2019-11-17 ENCOUNTER — Telehealth: Payer: Self-pay | Admitting: Internal Medicine

## 2019-11-17 ENCOUNTER — Other Ambulatory Visit: Payer: Self-pay | Admitting: Nurse Practitioner

## 2019-11-17 DIAGNOSIS — R413 Other amnesia: Secondary | ICD-10-CM

## 2019-11-17 DIAGNOSIS — M4807 Spinal stenosis, lumbosacral region: Secondary | ICD-10-CM

## 2019-11-17 DIAGNOSIS — M5136 Other intervertebral disc degeneration, lumbar region: Secondary | ICD-10-CM

## 2019-11-17 NOTE — Telephone Encounter (Signed)
Called daughter's number to schedule a Palliative Consult for patient, no answer - left message letting the daughter know that the NP has an opening tomorrow and I requested a call back as soon as possible to let me know if they would like to schedule for tomorrow.  Left my name and call back number.

## 2019-11-17 NOTE — Progress Notes (Signed)
Called susan and discussed with her. Pt has a lot of sensitivities to pain medication with nausea and vomiting. However she is having a lot of pain and needing more supervision and help around the home. Will refer to palliative care for pain management at this time.

## 2019-11-17 NOTE — Telephone Encounter (Signed)
Rec'd return call from daughter, [name] and after discussing Palliative services with her she was in agreement with scheduling a visit with NP.  I have scheduled an In-person Consult for 11/18/19 @ 8:30 AM.

## 2019-11-18 ENCOUNTER — Other Ambulatory Visit: Payer: Self-pay

## 2019-11-18 ENCOUNTER — Other Ambulatory Visit: Payer: Medicare Other | Admitting: Internal Medicine

## 2019-11-18 DIAGNOSIS — M4807 Spinal stenosis, lumbosacral region: Secondary | ICD-10-CM | POA: Diagnosis not present

## 2019-11-18 DIAGNOSIS — Z515 Encounter for palliative care: Secondary | ICD-10-CM

## 2019-11-18 DIAGNOSIS — M545 Low back pain, unspecified: Secondary | ICD-10-CM

## 2019-11-18 DIAGNOSIS — G8929 Other chronic pain: Secondary | ICD-10-CM | POA: Diagnosis not present

## 2019-11-18 NOTE — Progress Notes (Signed)
[facility] Palliative Care Consult Note Telephone: [phone]  Fax: [phone]  PATIENT NAME: [name] DOB: 08/23/1925 MRN: [id number]  PRIMARY CARE PROVIDER:   Lauree Chandler, NP  REFERRING PROVIDER:  Lauree Chandler, NP [place],  [place] [address]  RESPONSIBLE PARTY:    Erskine Squibb Daughter [phone]        RECOMMENDATIONS and PLAN:   Palliative care encounter  Z51.5  1.  Advance care planning: Explanation of palliative and hospice care services.  Reviewed goals of care with patient, daughter, daughter-in-law.  Patient's primary goals are to improve current lower back pain, improve  nutritional intake, and to continue to live independently in present home.  Advanced directives were reviewed.  Daughter reports that DNR and MOST form documents were previously completed.  MOST form selections are:   do not attempt resuscitation, limited additional interventions, antibiotics and IV therapy if indicated, no feeding tube.  A duplicate DNR form was completed today for display in pt's home.  Palliative care will follow up with patient in approximately 4 to 5 weeks.  2.  Lower back pain:  Significantly debilitating.  Trial use of Voltaren gel topically to affected area as needed.  Tylenol 500 mg in the morning and evening.  Daughter will place in patient's pill box for administration.  Consider beginning use of Cymbalta 30 mg 1 time per day to assist in decrease of back pain.  Will discuss with primary care in reference to this plan. Pending home health evaluation.  Recommend home PT therapy/personal aid.   3. Cognitive Deficit: Noted small vessel ischemia on previous brain imaging.  FAST stage 6a Discussed with daughter and daughter-in-law need for change of method of medication administration, meal preparation and safety within the home.  Supportive care as needed.                            I spent 60 minutes providing this  consultation,  from [place] to 095. More than 50% of the time in this consultation was spent coordinating communication.   HISTORY OF PRESENT ILLNESS:  [name] is a 86 y.o. year old female with multiple medical problems including spinal stenosis, osteoarthritis, anxiety.  Pt and daughter reports new onset of back pain that began 2-3 weeks ago that has been causing difficulty with ambulation. Reports that pain initiated in the right hip, mid spine and radiating into left hip.  She has been evaluated by numerous providers related to back pain.  Daughter reports more noticeable lapses of memory with missed medication doses and meals. Palliative Care was asked to help address goals of care.   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   carvedilol (COREG) 12.5 MG tablet Take 1 tablet by mouth twice daily   denosumab (PROLIA) 60 MG/ML SOSY injection Inject 60 mg into the skin every 6 (six) months.   fluticasone (FLONASE) 50 MCG/ACT nasal spray Use one spray in each nostril twice daily as needed for congestion   ibuprofen (ADVIL) 200 MG tablet Take 200 mg by mouth every 6 (six) hours as needed for mild pain or moderate pain.   metoCLOPramide (REGLAN) 5 MG tablet Take 5 mg by mouth 2 (two) times daily.   mirabegron ER (MYRBETRIQ) 25 MG TB24 tablet Take 1 tablet (25 mg total) by mouth daily.   mirtazapine (REMERON) 15 MG tablet TAKE 1 TABLET BY MOUTH EVERY DAY AT BEDTIME   Multiple Vitamins-Minerals (ICAPS AREDS 2) CAPS Take 2 capsules by mouth daily.   ondansetron (ZOFRAN) 4 MG tablet Take 1 tablet (4 mg total) by mouth every 8 (eight) hours as needed for nausea.   pantoprazole (PROTONIX) 40 MG tablet Take 1 tablet (40 mg total) by mouth 2 (two) times daily.   predniSONE (STERAPRED UNI-PAK 21 TAB) 10 MG (21) TBPK tablet Use as directed   No facility-administered encounter medications on file as of 11/18/2019.    PHYSICAL EXAM:   General: NAD,well nourishec appearing EENT:  Very hard of hearing Cardiovascular: regular rate and rhythm Pulmonary:clear throughout.  Abdomen: soft, nontender, + bowel sounds Extremities: no edema, no joint deformities. Unsteadt ammbulatory with use of walker Skin: exposed skin is intact.  Sh Neurological: A&O 3.  Forgetful Psych:  Calm and cooperative.  Pleasant mook  Gonzella Lex, NP-C

## 2019-11-20 ENCOUNTER — Telehealth: Payer: Self-pay | Admitting: Nurse Practitioner

## 2019-11-20 DIAGNOSIS — K219 Gastro-esophageal reflux disease without esophagitis: Secondary | ICD-10-CM | POA: Diagnosis not present

## 2019-11-20 DIAGNOSIS — M47817 Spondylosis without myelopathy or radiculopathy, lumbosacral region: Secondary | ICD-10-CM | POA: Diagnosis not present

## 2019-11-20 DIAGNOSIS — M5136 Other intervertebral disc degeneration, lumbar region: Secondary | ICD-10-CM

## 2019-11-20 DIAGNOSIS — Z8673 Personal history of transient ischemic attack (TIA), and cerebral infarction without residual deficits: Secondary | ICD-10-CM | POA: Diagnosis not present

## 2019-11-20 DIAGNOSIS — I5032 Chronic diastolic (congestive) heart failure: Secondary | ICD-10-CM | POA: Diagnosis not present

## 2019-11-20 DIAGNOSIS — M4807 Spinal stenosis, lumbosacral region: Secondary | ICD-10-CM | POA: Diagnosis not present

## 2019-11-20 DIAGNOSIS — E785 Hyperlipidemia, unspecified: Secondary | ICD-10-CM | POA: Diagnosis not present

## 2019-11-20 DIAGNOSIS — M4800 Spinal stenosis, site unspecified: Secondary | ICD-10-CM | POA: Diagnosis not present

## 2019-11-20 DIAGNOSIS — M1711 Unilateral primary osteoarthritis, right knee: Secondary | ICD-10-CM | POA: Diagnosis not present

## 2019-11-20 DIAGNOSIS — I110 Hypertensive heart disease with heart failure: Secondary | ICD-10-CM | POA: Diagnosis not present

## 2019-11-20 DIAGNOSIS — Z954 Presence of other heart-valve replacement: Secondary | ICD-10-CM | POA: Diagnosis not present

## 2019-11-20 MED ORDER — DULOXETINE HCL 30 MG PO CPEP: 30.0000 mg | ORAL_CAPSULE | Freq: Every day | ORAL | 3 refills | Status: DC

## 2019-11-20 NOTE — Telephone Encounter (Signed)
Rx sent to pharmacy, we will need to follow up with her to she how things are going, can be telephone visit 1 month

## 2019-11-20 NOTE — Telephone Encounter (Signed)
Gonzella Lex with [facility] called regarding patient.  She has spoken with patient's daughter regarding adding Cymbalta 30mg .  Enid Derry wants to know if Janett Billow has any objections to this order.  Also wants to know if she needs to write the order or does Janett Billow want to do it.  Enid Derry can be reached at [phone].  Thank you   [employer]

## 2019-11-23 ENCOUNTER — Telehealth: Payer: Self-pay | Admitting: *Deleted

## 2019-11-23 NOTE — Telephone Encounter (Signed)
[place] with [facility] called requesting verbal orders for ST 1x2 and 2x2 Verbal order given.   OV Note Dated 11/13/19: 5. Memory loss -progressively worsening memory loss, had CVA noted on CT in March - Ambulatory referral to [place] for ST

## 2019-11-24 ENCOUNTER — Telehealth: Payer: Self-pay | Admitting: *Deleted

## 2019-11-24 DIAGNOSIS — Z8673 Personal history of transient ischemic attack (TIA), and cerebral infarction without residual deficits: Secondary | ICD-10-CM | POA: Diagnosis not present

## 2019-11-24 DIAGNOSIS — M4807 Spinal stenosis, lumbosacral region: Secondary | ICD-10-CM | POA: Diagnosis not present

## 2019-11-24 DIAGNOSIS — I110 Hypertensive heart disease with heart failure: Secondary | ICD-10-CM | POA: Diagnosis not present

## 2019-11-24 DIAGNOSIS — M4800 Spinal stenosis, site unspecified: Secondary | ICD-10-CM | POA: Diagnosis not present

## 2019-11-24 DIAGNOSIS — M5136 Other intervertebral disc degeneration, lumbar region: Secondary | ICD-10-CM | POA: Diagnosis not present

## 2019-11-24 DIAGNOSIS — I5032 Chronic diastolic (congestive) heart failure: Secondary | ICD-10-CM | POA: Diagnosis not present

## 2019-11-24 DIAGNOSIS — M47817 Spondylosis without myelopathy or radiculopathy, lumbosacral region: Secondary | ICD-10-CM | POA: Diagnosis not present

## 2019-11-24 DIAGNOSIS — Z954 Presence of other heart-valve replacement: Secondary | ICD-10-CM | POA: Diagnosis not present

## 2019-11-24 DIAGNOSIS — M1711 Unilateral primary osteoarthritis, right knee: Secondary | ICD-10-CM | POA: Diagnosis not present

## 2019-11-24 DIAGNOSIS — K219 Gastro-esophageal reflux disease without esophagitis: Secondary | ICD-10-CM | POA: Diagnosis not present

## 2019-11-24 DIAGNOSIS — E785 Hyperlipidemia, unspecified: Secondary | ICD-10-CM | POA: Diagnosis not present

## 2019-11-24 NOTE — Telephone Encounter (Signed)
Rosie with [facility] called requesting verbal orders for PT 1x1, 2x4, 1x4 and social worker eval.   Verbal orders given.

## 2019-11-26 DIAGNOSIS — M5136 Other intervertebral disc degeneration, lumbar region: Secondary | ICD-10-CM | POA: Diagnosis not present

## 2019-11-26 DIAGNOSIS — E785 Hyperlipidemia, unspecified: Secondary | ICD-10-CM | POA: Diagnosis not present

## 2019-11-26 DIAGNOSIS — M1711 Unilateral primary osteoarthritis, right knee: Secondary | ICD-10-CM | POA: Diagnosis not present

## 2019-11-26 DIAGNOSIS — M4800 Spinal stenosis, site unspecified: Secondary | ICD-10-CM | POA: Diagnosis not present

## 2019-11-26 DIAGNOSIS — K219 Gastro-esophageal reflux disease without esophagitis: Secondary | ICD-10-CM | POA: Diagnosis not present

## 2019-11-26 DIAGNOSIS — M47817 Spondylosis without myelopathy or radiculopathy, lumbosacral region: Secondary | ICD-10-CM | POA: Diagnosis not present

## 2019-11-26 DIAGNOSIS — Z954 Presence of other heart-valve replacement: Secondary | ICD-10-CM | POA: Diagnosis not present

## 2019-11-26 DIAGNOSIS — I5032 Chronic diastolic (congestive) heart failure: Secondary | ICD-10-CM | POA: Diagnosis not present

## 2019-11-26 DIAGNOSIS — Z8673 Personal history of transient ischemic attack (TIA), and cerebral infarction without residual deficits: Secondary | ICD-10-CM | POA: Diagnosis not present

## 2019-11-26 DIAGNOSIS — M4807 Spinal stenosis, lumbosacral region: Secondary | ICD-10-CM | POA: Diagnosis not present

## 2019-11-26 DIAGNOSIS — I110 Hypertensive heart disease with heart failure: Secondary | ICD-10-CM | POA: Diagnosis not present

## 2019-11-27 ENCOUNTER — Telehealth: Payer: Self-pay | Admitting: *Deleted

## 2019-11-27 DIAGNOSIS — M5136 Other intervertebral disc degeneration, lumbar region: Secondary | ICD-10-CM | POA: Diagnosis not present

## 2019-11-27 DIAGNOSIS — M47817 Spondylosis without myelopathy or radiculopathy, lumbosacral region: Secondary | ICD-10-CM | POA: Diagnosis not present

## 2019-11-27 DIAGNOSIS — Z8673 Personal history of transient ischemic attack (TIA), and cerebral infarction without residual deficits: Secondary | ICD-10-CM | POA: Diagnosis not present

## 2019-11-27 DIAGNOSIS — M1711 Unilateral primary osteoarthritis, right knee: Secondary | ICD-10-CM | POA: Diagnosis not present

## 2019-11-27 DIAGNOSIS — I5032 Chronic diastolic (congestive) heart failure: Secondary | ICD-10-CM | POA: Diagnosis not present

## 2019-11-27 DIAGNOSIS — I110 Hypertensive heart disease with heart failure: Secondary | ICD-10-CM | POA: Diagnosis not present

## 2019-11-27 DIAGNOSIS — M4807 Spinal stenosis, lumbosacral region: Secondary | ICD-10-CM | POA: Diagnosis not present

## 2019-11-27 DIAGNOSIS — E785 Hyperlipidemia, unspecified: Secondary | ICD-10-CM | POA: Diagnosis not present

## 2019-11-27 DIAGNOSIS — M4800 Spinal stenosis, site unspecified: Secondary | ICD-10-CM | POA: Diagnosis not present

## 2019-11-27 DIAGNOSIS — Z954 Presence of other heart-valve replacement: Secondary | ICD-10-CM | POA: Diagnosis not present

## 2019-11-27 DIAGNOSIS — K219 Gastro-esophageal reflux disease without esophagitis: Secondary | ICD-10-CM | POA: Diagnosis not present

## 2019-11-27 NOTE — Telephone Encounter (Signed)
Form was dropped off at patient's last visit. Janett Billow completed form and form was faxed to [facility] Fax: [phone]

## 2019-11-27 NOTE — Telephone Encounter (Signed)
Patient is scheduled for November 11th at 11:00

## 2019-11-30 ENCOUNTER — Telehealth: Payer: Self-pay

## 2019-11-30 DIAGNOSIS — M4800 Spinal stenosis, site unspecified: Secondary | ICD-10-CM | POA: Diagnosis not present

## 2019-11-30 DIAGNOSIS — I5032 Chronic diastolic (congestive) heart failure: Secondary | ICD-10-CM | POA: Diagnosis not present

## 2019-11-30 DIAGNOSIS — M1711 Unilateral primary osteoarthritis, right knee: Secondary | ICD-10-CM | POA: Diagnosis not present

## 2019-11-30 DIAGNOSIS — K219 Gastro-esophageal reflux disease without esophagitis: Secondary | ICD-10-CM | POA: Diagnosis not present

## 2019-11-30 DIAGNOSIS — M5136 Other intervertebral disc degeneration, lumbar region: Secondary | ICD-10-CM | POA: Diagnosis not present

## 2019-11-30 DIAGNOSIS — I110 Hypertensive heart disease with heart failure: Secondary | ICD-10-CM | POA: Diagnosis not present

## 2019-11-30 DIAGNOSIS — Z8673 Personal history of transient ischemic attack (TIA), and cerebral infarction without residual deficits: Secondary | ICD-10-CM | POA: Diagnosis not present

## 2019-11-30 DIAGNOSIS — E785 Hyperlipidemia, unspecified: Secondary | ICD-10-CM | POA: Diagnosis not present

## 2019-11-30 DIAGNOSIS — Z954 Presence of other heart-valve replacement: Secondary | ICD-10-CM | POA: Diagnosis not present

## 2019-11-30 DIAGNOSIS — M47817 Spondylosis without myelopathy or radiculopathy, lumbosacral region: Secondary | ICD-10-CM | POA: Diagnosis not present

## 2019-11-30 DIAGNOSIS — M4807 Spinal stenosis, lumbosacral region: Secondary | ICD-10-CM | POA: Diagnosis not present

## 2019-11-30 NOTE — Telephone Encounter (Signed)
Ms. Loletha Grayer RN from [facility] at [facility] just call with great  concern about [name].  She stated that her pain level is a 10 in her hip and lower back. She is not walking and sits in her chair, once she gets up she complains of the pain therefore she does not get up. She is taking tylenol 500 mg which is not working. She suggested a lidocaine patch, back and hip x-ray, and a follow up on palliative care. Lastly she stated that she wanted to concentrate on keeping  her comfortable as possible but definitely want to try and  get  her moving around, please advise. You saw her on 11/13/19 for basically the same thing. Please advise?

## 2019-11-30 NOTE — Telephone Encounter (Signed)
Palliative care is working with her for pain management, I do not mind her doing a lidocaine patch but I would like [place] all be on the same page incase they want to use something different. Would recommend them calling palliative care at this time.

## 2019-12-01 NOTE — Telephone Encounter (Signed)
Message given to Watkins at [facility] at home

## 2019-12-02 ENCOUNTER — Telehealth: Payer: Self-pay

## 2019-12-02 DIAGNOSIS — I5032 Chronic diastolic (congestive) heart failure: Secondary | ICD-10-CM | POA: Diagnosis not present

## 2019-12-02 DIAGNOSIS — M5136 Other intervertebral disc degeneration, lumbar region: Secondary | ICD-10-CM

## 2019-12-02 DIAGNOSIS — M1711 Unilateral primary osteoarthritis, right knee: Secondary | ICD-10-CM | POA: Diagnosis not present

## 2019-12-02 DIAGNOSIS — E785 Hyperlipidemia, unspecified: Secondary | ICD-10-CM | POA: Diagnosis not present

## 2019-12-02 DIAGNOSIS — Z8673 Personal history of transient ischemic attack (TIA), and cerebral infarction without residual deficits: Secondary | ICD-10-CM | POA: Diagnosis not present

## 2019-12-02 DIAGNOSIS — I110 Hypertensive heart disease with heart failure: Secondary | ICD-10-CM | POA: Diagnosis not present

## 2019-12-02 DIAGNOSIS — M47817 Spondylosis without myelopathy or radiculopathy, lumbosacral region: Secondary | ICD-10-CM | POA: Diagnosis not present

## 2019-12-02 DIAGNOSIS — M4807 Spinal stenosis, lumbosacral region: Secondary | ICD-10-CM | POA: Diagnosis not present

## 2019-12-02 DIAGNOSIS — Z954 Presence of other heart-valve replacement: Secondary | ICD-10-CM | POA: Diagnosis not present

## 2019-12-02 DIAGNOSIS — M4800 Spinal stenosis, site unspecified: Secondary | ICD-10-CM | POA: Diagnosis not present

## 2019-12-02 DIAGNOSIS — K219 Gastro-esophageal reflux disease without esophagitis: Secondary | ICD-10-CM | POA: Diagnosis not present

## 2019-12-02 NOTE — Telephone Encounter (Signed)
Call made to palliative care nurse practitioner who has been consulted for pain management, to discuss plan of care, awaiting call back.

## 2019-12-02 NOTE — Telephone Encounter (Signed)
Ms.Brady RN from [facility] states that patient has pain that's 10/10. Ms.Brady states that patient needs Orders/Prescription faxed to [facility] [place] for Lidocaine Patch. Patient also needs X-Ray on Both sides Lower Lumbar and Both Hips.Which can be faxed to [facility] as well. Ms.Brady states that case manager is [place].    [facility] Fax: [phone] [facility] Phone: [phone]

## 2019-12-03 ENCOUNTER — Telehealth: Payer: Self-pay | Admitting: Nurse Practitioner

## 2019-12-03 DIAGNOSIS — M4807 Spinal stenosis, lumbosacral region: Secondary | ICD-10-CM | POA: Diagnosis not present

## 2019-12-03 DIAGNOSIS — E785 Hyperlipidemia, unspecified: Secondary | ICD-10-CM | POA: Diagnosis not present

## 2019-12-03 DIAGNOSIS — M1711 Unilateral primary osteoarthritis, right knee: Secondary | ICD-10-CM | POA: Diagnosis not present

## 2019-12-03 DIAGNOSIS — M4800 Spinal stenosis, site unspecified: Secondary | ICD-10-CM | POA: Diagnosis not present

## 2019-12-03 DIAGNOSIS — K219 Gastro-esophageal reflux disease without esophagitis: Secondary | ICD-10-CM | POA: Diagnosis not present

## 2019-12-03 DIAGNOSIS — M47817 Spondylosis without myelopathy or radiculopathy, lumbosacral region: Secondary | ICD-10-CM | POA: Diagnosis not present

## 2019-12-03 DIAGNOSIS — Z8673 Personal history of transient ischemic attack (TIA), and cerebral infarction without residual deficits: Secondary | ICD-10-CM | POA: Diagnosis not present

## 2019-12-03 DIAGNOSIS — I110 Hypertensive heart disease with heart failure: Secondary | ICD-10-CM | POA: Diagnosis not present

## 2019-12-03 DIAGNOSIS — I5032 Chronic diastolic (congestive) heart failure: Secondary | ICD-10-CM | POA: Diagnosis not present

## 2019-12-03 DIAGNOSIS — Z954 Presence of other heart-valve replacement: Secondary | ICD-10-CM | POA: Diagnosis not present

## 2019-12-03 DIAGNOSIS — M5136 Other intervertebral disc degeneration, lumbar region: Secondary | ICD-10-CM | POA: Diagnosis not present

## 2019-12-03 MED ORDER — DULOXETINE HCL 60 MG PO CPEP: 60.0000 mg | ORAL_CAPSULE | Freq: Every day | ORAL | 1 refills | Status: AC

## 2019-12-03 NOTE — Telephone Encounter (Signed)
Hey [name] called back the number from [facility] this morning and she is calling back to let you know she has tried to reach you [phone] [name] (daughter) you can call her back when you can.

## 2019-12-03 NOTE — Telephone Encounter (Signed)
Spoke with daughter and she does not wish to have xrays repeated.  Will increase cymbalta to 60 mg daily at this time to see if this helps with pain.  Did lidocaine patch and it did not help in the past.  Has had cortisone shots in the past.

## 2019-12-03 NOTE — Telephone Encounter (Signed)
Daughter called.

## 2019-12-05 DIAGNOSIS — Z954 Presence of other heart-valve replacement: Secondary | ICD-10-CM | POA: Diagnosis not present

## 2019-12-05 DIAGNOSIS — K219 Gastro-esophageal reflux disease without esophagitis: Secondary | ICD-10-CM | POA: Diagnosis not present

## 2019-12-05 DIAGNOSIS — I5032 Chronic diastolic (congestive) heart failure: Secondary | ICD-10-CM | POA: Diagnosis not present

## 2019-12-05 DIAGNOSIS — I110 Hypertensive heart disease with heart failure: Secondary | ICD-10-CM | POA: Diagnosis not present

## 2019-12-05 DIAGNOSIS — M5136 Other intervertebral disc degeneration, lumbar region: Secondary | ICD-10-CM | POA: Diagnosis not present

## 2019-12-05 DIAGNOSIS — M1711 Unilateral primary osteoarthritis, right knee: Secondary | ICD-10-CM | POA: Diagnosis not present

## 2019-12-05 DIAGNOSIS — Z8673 Personal history of transient ischemic attack (TIA), and cerebral infarction without residual deficits: Secondary | ICD-10-CM | POA: Diagnosis not present

## 2019-12-05 DIAGNOSIS — M4807 Spinal stenosis, lumbosacral region: Secondary | ICD-10-CM | POA: Diagnosis not present

## 2019-12-05 DIAGNOSIS — E785 Hyperlipidemia, unspecified: Secondary | ICD-10-CM | POA: Diagnosis not present

## 2019-12-05 DIAGNOSIS — M4800 Spinal stenosis, site unspecified: Secondary | ICD-10-CM | POA: Diagnosis not present

## 2019-12-05 DIAGNOSIS — M47817 Spondylosis without myelopathy or radiculopathy, lumbosacral region: Secondary | ICD-10-CM | POA: Diagnosis not present

## 2019-12-07 ENCOUNTER — Telehealth: Payer: Self-pay | Admitting: *Deleted

## 2019-12-07 DIAGNOSIS — M5136 Other intervertebral disc degeneration, lumbar region: Secondary | ICD-10-CM | POA: Diagnosis not present

## 2019-12-07 DIAGNOSIS — M4807 Spinal stenosis, lumbosacral region: Secondary | ICD-10-CM | POA: Diagnosis not present

## 2019-12-07 DIAGNOSIS — I110 Hypertensive heart disease with heart failure: Secondary | ICD-10-CM | POA: Diagnosis not present

## 2019-12-07 DIAGNOSIS — M47817 Spondylosis without myelopathy or radiculopathy, lumbosacral region: Secondary | ICD-10-CM | POA: Diagnosis not present

## 2019-12-07 DIAGNOSIS — E785 Hyperlipidemia, unspecified: Secondary | ICD-10-CM | POA: Diagnosis not present

## 2019-12-07 DIAGNOSIS — Z954 Presence of other heart-valve replacement: Secondary | ICD-10-CM | POA: Diagnosis not present

## 2019-12-07 DIAGNOSIS — K219 Gastro-esophageal reflux disease without esophagitis: Secondary | ICD-10-CM | POA: Diagnosis not present

## 2019-12-07 DIAGNOSIS — I5032 Chronic diastolic (congestive) heart failure: Secondary | ICD-10-CM | POA: Diagnosis not present

## 2019-12-07 DIAGNOSIS — M1711 Unilateral primary osteoarthritis, right knee: Secondary | ICD-10-CM | POA: Diagnosis not present

## 2019-12-07 DIAGNOSIS — M4800 Spinal stenosis, site unspecified: Secondary | ICD-10-CM | POA: Diagnosis not present

## 2019-12-07 DIAGNOSIS — Z8673 Personal history of transient ischemic attack (TIA), and cerebral infarction without residual deficits: Secondary | ICD-10-CM | POA: Diagnosis not present

## 2019-12-07 NOTE — Telephone Encounter (Signed)
Nurse notified and agreed.  

## 2019-12-07 NOTE — Telephone Encounter (Signed)
If she is not having pain with ROM I would not recommend xrays at this time.  To continue to monitor and treat wound, notify and make appt if we need to evaluate in office.

## 2019-12-07 NOTE — Telephone Encounter (Signed)
Sondra with [facility] called and stated that patient fell this morning. Stated that no one was there with her when she fell. Patient now has Large bruise from Fingers on Left hand up her forearm. Not complaining of pain. Nurse did do range of motion and she seems fine doing it.   Nurse is wanting to know if a [facility] needs to be done and if so needs order.   Also has a wound on back of Right leg that was already there that has came open. Nurse stated that she is going to treat it.   Please Advise.

## 2019-12-08 ENCOUNTER — Telehealth: Payer: Self-pay

## 2019-12-08 ENCOUNTER — Other Ambulatory Visit: Payer: Self-pay

## 2019-12-08 ENCOUNTER — Telehealth: Payer: Self-pay | Admitting: Nurse Practitioner

## 2019-12-08 ENCOUNTER — Telehealth (INDEPENDENT_AMBULATORY_CARE_PROVIDER_SITE_OTHER): Payer: Medicare Other | Admitting: Nurse Practitioner

## 2019-12-08 ENCOUNTER — Telehealth: Payer: Self-pay | Admitting: *Deleted

## 2019-12-08 DIAGNOSIS — I110 Hypertensive heart disease with heart failure: Secondary | ICD-10-CM | POA: Diagnosis not present

## 2019-12-08 DIAGNOSIS — Z515 Encounter for palliative care: Secondary | ICD-10-CM | POA: Insufficient documentation

## 2019-12-08 DIAGNOSIS — R627 Adult failure to thrive: Secondary | ICD-10-CM

## 2019-12-08 DIAGNOSIS — M4800 Spinal stenosis, site unspecified: Secondary | ICD-10-CM | POA: Diagnosis not present

## 2019-12-08 DIAGNOSIS — M47817 Spondylosis without myelopathy or radiculopathy, lumbosacral region: Secondary | ICD-10-CM | POA: Diagnosis not present

## 2019-12-08 DIAGNOSIS — Z954 Presence of other heart-valve replacement: Secondary | ICD-10-CM | POA: Diagnosis not present

## 2019-12-08 DIAGNOSIS — M1711 Unilateral primary osteoarthritis, right knee: Secondary | ICD-10-CM | POA: Diagnosis not present

## 2019-12-08 DIAGNOSIS — K219 Gastro-esophageal reflux disease without esophagitis: Secondary | ICD-10-CM | POA: Diagnosis not present

## 2019-12-08 DIAGNOSIS — Z8673 Personal history of transient ischemic attack (TIA), and cerebral infarction without residual deficits: Secondary | ICD-10-CM | POA: Diagnosis not present

## 2019-12-08 DIAGNOSIS — I5032 Chronic diastolic (congestive) heart failure: Secondary | ICD-10-CM | POA: Diagnosis not present

## 2019-12-08 DIAGNOSIS — E785 Hyperlipidemia, unspecified: Secondary | ICD-10-CM | POA: Diagnosis not present

## 2019-12-08 DIAGNOSIS — M4807 Spinal stenosis, lumbosacral region: Secondary | ICD-10-CM

## 2019-12-08 DIAGNOSIS — M5136 Other intervertebral disc degeneration, lumbar region: Secondary | ICD-10-CM

## 2019-12-08 DIAGNOSIS — R413 Other amnesia: Secondary | ICD-10-CM

## 2019-12-08 MED ORDER — MORPHINE SULFATE 10 MG/5ML PO SOLN: 5.0000 mg | ORAL | 0 refills | Status: DC | PRN

## 2019-12-08 NOTE — Telephone Encounter (Signed)
[name] received a call on this yesterday and notified me, I responded in epic and anita followed up with the nurse.

## 2019-12-08 NOTE — Telephone Encounter (Signed)
Called and spoke with [name], daughter. She wants to speak with [name] directly due to memory issues and hip pain.   I offered an in office visit and she stated that she cannot bring her in because she cannot get her into the car. Wants televisit.  Scheduled.

## 2019-12-08 NOTE — Telephone Encounter (Signed)
Received fax from [facility] requesting prior authorization for patient's Morphine. Initiated prior auth through [facility].   Key: Altamese Dilling into determination.

## 2019-12-08 NOTE — Progress Notes (Signed)
This service is provided via telemedicine  No vital signs collected/recorded due to the encounter was a telemedicine visit.   Location of patient (ex: home, work):  Home  Patient consents to a telephone visit:  Yes, see encounter dated 12/08/2019  Location of the provider (ex: office, home):  [place]  Name of any referring provider:  N/A  Names of all persons participating in the telemedicine service and their role in the encounter:  Sherrie Mustache, Nurse Practitioner, Carroll Kinds, CMA, patient and daughter [name]   Time spent on call:  10 minutes with medical assistant     Careteam: Patient Care Team: Lauree Chandler, NP as PCP - General (Geriatric Medicine) Milus Banister, MD as Attending Physician (Gastroenterology) Alphonsa Overall, MD as Consulting Physician (General Surgery) Sherren Mocha, MD as Consulting Physician (Cardiology) Suella Broad, MD as Consulting Physician (Physical Medicine and Rehabilitation) Dalton-Bethea, Fabio Asa, MD as Consulting Physician (Physical Medicine and Rehabilitation) Thalia Bloodgood, OD as Referring Physician (Optometry)  Advanced Directive information    Allergies  Allergen Reactions  . Statins     pain  . Codeine Nausea And Vomiting  . Phenytoin Nausea And Vomiting    Chief Complaint  Patient presents with  . Acute Visit    Memory issues and hip pain Hip pain from fall on Sunday. Daughter states she doesn't remember fall. Groin and hip hurting from fall. Wants to know if she could get something for pain with phenegran.  Patient's daughter and granddaughter now staying with her. Legs and ankles swelling up. Pallative care coming out not helping. Patient won't eat or drink anything. Memory coming and going, but yesterday  seems to be completely gone.. Patient seems to have gone downhill since last office visit.      HPI: Patient is a 86 y.o. female for follow up with daughter.   Pt had a fall 2 days ago  and bruised from the hand to elbow.  Nurse came to evaluate her but she was not having any worsening pain or decrease in ROM.   Reports ST came out to evaluate her and she has been a poor historian and inconsistent with her history. Her memory has significantly gotten worse.   Can not sleep in her bed anymore because it is hard to get her up. Reports it is hard to get her up to the bathroom.  In the last week she has significantly declined. Not eating or drinking.   Daughter reports pts granddaughter is staying with her and calling her frequently talking about how much pain she is in her daughter wants her to be out of pain. Comfort approach.   Reports palliative care has not touched base with her this week.    Review of Systems:  Review of Systems  Constitutional: Positive for malaise/fatigue.  Cardiovascular: Positive for leg swelling.  Musculoskeletal: Positive for back pain, falls and joint pain.  Neurological: Positive for weakness.    Past Medical History:  Diagnosis Date  . Anxiety   . Arthritis    Both knees R>L  . Central stenosis of spinal canal    moderate L4/5 with L4/5 and L5/S1 disc bulges  . Chronic diastolic congestive heart failure, NYHA class 2 ([facility])   . Facet joint syndrome    Left L2-3, L3-4, L4-5, L5-S1  . GERD (gastroesophageal reflux disease) 05/14/2011  . Heart murmur   . Hyperlipidemia   . Hypertension   . Left low back pain    without sacroiliac  joint componet  . Lumbago   . Lumbosacral spondylosis without myelopathy   . 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 Severe aortic stenosis 12/06/2013  . Shortness of breath dyspnea   . Spinal stenosis, lumbosacral region    Past Surgical History:  Procedure Laterality Date  . CARPAL TUNNEL RELEASE Right 10/31/2015  . CATARACT EXTRACTION Bilateral   . CHOLECYSTECTOMY  2008  . EYE  SURGERY    . INTRAOPERATIVE TRANSESOPHAGEAL ECHOCARDIOGRAM N/A 01/19/2014   Procedure: INTRAOPERATIVE TRANSESOPHAGEAL ECHOCARDIOGRAM;  Surgeon: Sherren Mocha, MD;  [facility];  Service: Open Heart Surgery;  Laterality: N/A;  . KNEE ARTHROSCOPY Bilateral   . LEFT AND RIGHT HEART CATHETERIZATION WITH CORONARY ANGIOGRAM N/A 12/08/2013   Procedure: LEFT AND RIGHT HEART CATHETERIZATION WITH CORONARY ANGIOGRAM;  Surgeon: Sanda Klein, MD;  Location: [facility] CATH LAB;  Service: Cardiovascular;  Laterality: N/A;  . REFRACTIVE SURGERY Bilateral   . REPLACEMENT TOTAL KNEE Right   . TONSILLECTOMY     70 years ago  . TOTAL KNEE ARTHROPLASTY Right 04/28/2014   Procedure: TOTAL KNEE ARTHROPLASTY;  Surgeon: Kathryne Hitch, MD;  Location: [place];  Service: Orthopedics;  Laterality: Right;  . TRANSCATHETER AORTIC VALVE REPLACEMENT, TRANSFEMORAL N/A 01/19/2014   Procedure: TRANSCATHETER AORTIC VALVE REPLACEMENT, TRANSFEMORAL;  Surgeon: Sherren Mocha, MD;  Location: [place];  Service: Open Heart Surgery;  Laterality: N/A;   Social History:   reports that she has never smoked. She has never used smokeless tobacco. She reports that she does not drink alcohol and does not use drugs.  Family History  Problem Relation Age of Onset  . Muscular dystrophy Father        Died at 75  . Stroke Mother   . Ovarian cancer Sister   . Breast cancer Sister   . Heart attack Sister        Died at 74  . Colon cancer Neg Hx   . Hypertension Neg Hx     Medications: Patient's Medications  New Prescriptions   No medications on file  Previous Medications   AMLODIPINE (NORVASC) 10 MG TABLET    Take 1 tablet by mouth once daily   ASPIRIN EC 81 MG TABLET    Take 1 tablet (81 mg total) by mouth daily.   CARVEDILOL (COREG) 12.5 MG TABLET    Take 1 tablet by mouth twice daily   DENOSUMAB (PROLIA) 60 MG/ML SOSY INJECTION    Inject 60 mg into the skin every 6 (six) months.   DULOXETINE (CYMBALTA) 60 MG CAPSULE    Take 1 capsule (60  mg total) by mouth daily.   FLUTICASONE (FLONASE) 50 MCG/ACT NASAL SPRAY    Use one spray in each nostril twice daily as needed for congestion   IBUPROFEN (ADVIL) 200 MG TABLET    Take 200 mg by mouth every 6 (six) hours as needed for mild pain or moderate pain.   METOCLOPRAMIDE (REGLAN) 5 MG TABLET    Take 5 mg by mouth 2 (two) times daily.   MIRABEGRON ER (MYRBETRIQ) 25 MG TB24 TABLET    Take 1 tablet (25 mg total) by mouth daily.   MIRTAZAPINE (REMERON) 15 MG TABLET    TAKE 1 TABLET BY MOUTH EVERY DAY AT BEDTIME   MULTIPLE VITAMINS-MINERALS (ICAPS AREDS 2) CAPS    Take 2 capsules by mouth daily.   ONDANSETRON (ZOFRAN) 4 MG TABLET  Take 1 tablet (4 mg total) by mouth every 8 (eight) hours as needed for nausea.   PANTOPRAZOLE (PROTONIX) 40 MG TABLET    Take 1 tablet (40 mg total) by mouth 2 (two) times daily.  Modified Medications   No medications on file  Discontinued Medications   PREDNISONE (STERAPRED UNI-PAK 21 TAB) 10 MG (21) TBPK TABLET    Use as directed    Physical Exam:  There were no vitals filed for this visit. There is no height or weight on file to calculate BMI. Wt Readings from Last 3 Encounters:  11/13/19 138 lb 3.2 oz (62.7 kg)  10/02/19 138 lb (62.6 kg)  04/27/19 131 lb (59.4 kg)      Labs reviewed: Basic Metabolic Panel: Recent Labs    01/31/19 1430 02/03/19 0948  NA 139 140  K 3.7 4.7  CL 103 106  CO2 24 24  GLUCOSE 101* 98  BUN 20 25  CREATININE 1.66* 1.49*  CALCIUM 8.9 9.6   Liver Function Tests: Recent Labs    01/31/19 1430  AST 27  ALT 25  ALKPHOS 32*  BILITOT 0.6  PROT 7.4  ALBUMIN 3.7   No results for input(s): LIPASE, AMYLASE in the last 8760 hours. No results for input(s): AMMONIA in the last 8760 hours. CBC: Recent Labs    01/31/19 1430 02/03/19 0948 11/13/19 1346  WBC 6.9 5.6 8.1  NEUTROABS 3.4 2,873 4,860  HGB 10.2* 10.9* 10.8*  HCT 33.2* 34.1* 33.9*  MCV 92.0 88.8 90.4  PLT 397 401* 387   Lipid Panel: No results  for input(s): CHOL, HDL, LDLCALC, TRIG, CHOLHDL, LDLDIRECT in the last 8760 hours. TSH: No results for input(s): TSH in the last 8760 hours. A1C: Lab Results  Component Value Date   HGBA1C 5.4 01/15/2014     Assessment/Plan 1. Palliative care patient -daughter would like comfort measures only.  -hospice consult at this time.  - morphine 10 MG/5ML solution; Take 2.5 mLs (5 mg total) by mouth every 4 (four) hours as needed for severe pain.  Dispense: 100 mL; Refill: 0  2. DDD (degenerative disc disease), lumbar -in severe pain, comfort measures only at this time.  - morphine 10 MG/5ML solution; Take 2.5 mLs (5 mg total) by mouth every 4 (four) hours as needed for severe pain.  Dispense: 100 mL; Refill: 0 -can use Zofran as needed for nausea.  - Ambulatory referral to Hospice  3. Lumbosacral stenosis - morphine 10 MG/5ML solution; Take 2.5 mLs (5 mg total) by mouth every 4 (four) hours as needed for severe pain.  Dispense: 100 mL; Refill: 0 - Ambulatory referral to Hospice  4. Memory loss Progressive decline, daughter would like comfort measures only at this time.  - Ambulatory referral to Hospice  5. Failure to thrive in adult -decrease oral intake with food and water, less active and chair bound due to severe pain, daughter would like to transition to hospice care at this time for comfort measures only.  - Ambulatory referral to [place]. Harle Battiest  [facility] [phone]    Virtual Visit via telephone (unable to connect virtually)  I connected with patient on 12/08/19 at 10:30 AM EDT by telephone and verified that I am speaking with the correct person using two identifiers.  Location: Patient: home Provider: [facility]   I discussed the limitations, risks, security and privacy concerns of performing an evaluation and management service by telephone and the availability of  in person appointments. I also discussed with the  patient that there may be a patient responsible charge related to this service. The patient expressed understanding and agreed to proceed.   I discussed the assessment and treatment plan with the patient. The patient was provided an opportunity to ask questions and all were answered. The patient agreed with the plan and demonstrated an understanding of the instructions.   The patient was advised to call back or seek an in-person evaluation if the symptoms worsen or if the condition fails to improve as anticipated.  I provided 25 minutes of non-face-to-face time during this encounter.  Carlos American. Harle Battiest Avs printed and mailed

## 2019-12-08 NOTE — Telephone Encounter (Signed)
[name], tyson are scheduled for a virtual visit with your provider today.    Just as we do with appointments in the office, we must obtain your consent to participate.  Your consent will be active for this visit and any virtual visit you may have with one of our providers in the next 365 days.    If you have a MyChart account, I can also send a copy of this consent to you electronically.  All virtual visits are billed to your insurance company just like a traditional visit in the office.  As this is a virtual visit, video technology does not allow for your provider to perform a traditional examination.  This may limit your provider's ability to fully assess your condition.  If your provider identifies any concerns that need to be evaluated in person or the need to arrange testing such as labs, EKG, etc, we will make arrangements to do so.    Although advances in technology are sophisticated, we cannot ensure that it will always work on either your end or our end.  If the connection with a video visit is poor, we may have to switch to a telephone visit.  With either a video or telephone visit, we are not always able to ensure that we have a secure connection.   I need to obtain your verbal consent now.   Are you willing to proceed with your visit today?   [name] has provided verbal consent on 12/08/2019 for a virtual visit (video or telephone).   Carroll Kinds, CMA 12/08/2019  10:42 AM

## 2019-12-08 NOTE — Telephone Encounter (Signed)
Me     12/07/19 2:11 PM Note Nurse notified and agreed.     Lauree Chandler, NP to Me     12/07/19 12:52 PM Note If she is not having pain with ROM I would not recommend xrays at this time.  To continue to monitor and treat wound, notify and make appt if we need to evaluate in office

## 2019-12-08 NOTE — Telephone Encounter (Signed)
Patient's daughter [name] called this morning.  She stated that her mother  fell on Sunday 12/06/19. .  The [facility] nurses came to see patient yesterday and per [name], [facility] called our office to discuss a mobile x-ray. Per [name] neither she or [facility] has heard anything from our office. Please call [name].    Thank you   [employer]

## 2019-12-09 ENCOUNTER — Other Ambulatory Visit: Payer: Self-pay | Admitting: Nurse Practitioner

## 2019-12-09 ENCOUNTER — Telehealth: Payer: Self-pay

## 2019-12-09 DIAGNOSIS — M1711 Unilateral primary osteoarthritis, right knee: Secondary | ICD-10-CM | POA: Diagnosis not present

## 2019-12-09 DIAGNOSIS — K219 Gastro-esophageal reflux disease without esophagitis: Secondary | ICD-10-CM | POA: Diagnosis not present

## 2019-12-09 DIAGNOSIS — M5136 Other intervertebral disc degeneration, lumbar region: Secondary | ICD-10-CM | POA: Diagnosis not present

## 2019-12-09 DIAGNOSIS — Z954 Presence of other heart-valve replacement: Secondary | ICD-10-CM | POA: Diagnosis not present

## 2019-12-09 DIAGNOSIS — I110 Hypertensive heart disease with heart failure: Secondary | ICD-10-CM | POA: Diagnosis not present

## 2019-12-09 DIAGNOSIS — M4807 Spinal stenosis, lumbosacral region: Secondary | ICD-10-CM | POA: Diagnosis not present

## 2019-12-09 DIAGNOSIS — E785 Hyperlipidemia, unspecified: Secondary | ICD-10-CM | POA: Diagnosis not present

## 2019-12-09 DIAGNOSIS — Z8673 Personal history of transient ischemic attack (TIA), and cerebral infarction without residual deficits: Secondary | ICD-10-CM | POA: Diagnosis not present

## 2019-12-09 DIAGNOSIS — M47817 Spondylosis without myelopathy or radiculopathy, lumbosacral region: Secondary | ICD-10-CM | POA: Diagnosis not present

## 2019-12-09 DIAGNOSIS — I5032 Chronic diastolic (congestive) heart failure: Secondary | ICD-10-CM | POA: Diagnosis not present

## 2019-12-09 DIAGNOSIS — M4800 Spinal stenosis, site unspecified: Secondary | ICD-10-CM | POA: Diagnosis not present

## 2019-12-09 MED ORDER — MORPHINE SULFATE 20 MG/5ML PO SOLN: 5.0000 mg | ORAL | 0 refills | Status: AC | PRN

## 2019-12-09 NOTE — Telephone Encounter (Signed)
[name], patient's daughter, called stating that [facility] doesn't carry Morphine 10 mg/41ml solution.  Told her she would have to call around to see who carries it. She is going to call around to see which pharmacy carries it and will give [place] a call back to let us know which one to call it in to.

## 2019-12-09 NOTE — Telephone Encounter (Signed)
Prescription has been sent. Thank you.  

## 2019-12-09 NOTE — Telephone Encounter (Signed)
Patients daughter called around and no one has medication in stock. Patient call [facility] back and they said they can order it but it will not be in until Friday.  [facility] will need a new rx for they canceled the one sent because [name] mentioned she would check other pharmacies.  In the interim [name] would like to know if Sheri Chandler, NP will send in rx for a pill until liquid received or if she can change the rx altogether to a pill  Please advise and route response to [name] in Clinical Intake  Thanks, S.Chrae B/CMA

## 2019-12-09 NOTE — Telephone Encounter (Signed)
Pill is much higher dose and would not want to start on this, does the pharmacy have a different concentration we could order?

## 2019-12-09 NOTE — Telephone Encounter (Signed)
[name] came to me wanting me to call [facility] or [facility] on [address] and see if they carry the Morphine 20mg /ml  [facility] [address] #[phone]  They do have Morphine 20mg /ml. Stated they have 54ml on hand. Stated if you send the Rx over patient would be able to pick up today.   I called daughter, [name] and she stated that she would be able to pick it up there tomorrow.

## 2019-12-09 NOTE — Telephone Encounter (Signed)
She has a sensitivity to hydrocodone and gets very nauseous when taking that is why morphine was prescribed.

## 2019-12-09 NOTE — Telephone Encounter (Signed)
[facility] called and stated she has now been referred to Hospice. She was asking if [name] would continue to be her provider of record and sign orders. I gave verbal consent for such.

## 2019-12-09 NOTE — Telephone Encounter (Signed)
Spoke with pharmacist at [facility] and she stated that they cannot guarantee to get in a controlled substance liquid.  Stated that they do not have anything comparable and you [name] need to change her to something different like Hydrocodone. Stated they have nothing liquid.  Please Advise.

## 2019-12-14 ENCOUNTER — Telehealth: Payer: Self-pay | Admitting: *Deleted

## 2019-12-14 NOTE — Telephone Encounter (Signed)
[facility] notified and agreed.

## 2019-12-14 NOTE — Telephone Encounter (Signed)
Can do mucinex dm by mouth twice daily with full glass of water  Okay to start duoneb every 6 hours as needed for wheezing.  May also need a visit for evaluation.

## 2019-12-14 NOTE — Telephone Encounter (Signed)
Varney Biles with [facility] called and stated that patient is having Wheezing, Coughing and Congestion. No fever.  Nurse is wanting a Verbal order to call her in a Nebulizer and Solution. Stated that she will call it in just needs order.   Please Advise.

## 2019-12-30 ENCOUNTER — Ambulatory Visit: Payer: Medicare Other | Admitting: Cardiovascular Disease

## 2019-12-31 ENCOUNTER — Ambulatory Visit: Payer: Self-pay | Admitting: Nurse Practitioner

## 2020-04-08 ENCOUNTER — Ambulatory Visit: Payer: Medicare Other | Admitting: Nurse Practitioner
